# Patient Record
Sex: Female | Born: 1935
Health system: Southern US, Community
[De-identification: ages and names within clinical notes are randomized; demographics above are authoritative.]

## PROBLEM LIST (undated history)

## (undated) DIAGNOSIS — Z780 Asymptomatic menopausal state: Secondary | ICD-10-CM

## (undated) DIAGNOSIS — K219 Gastro-esophageal reflux disease without esophagitis: Secondary | ICD-10-CM

## (undated) DIAGNOSIS — Z8679 Personal history of other diseases of the circulatory system: Secondary | ICD-10-CM

## (undated) DIAGNOSIS — E669 Obesity, unspecified: Secondary | ICD-10-CM

## (undated) DIAGNOSIS — K5790 Diverticulosis of intestine, part unspecified, without perforation or abscess without bleeding: Secondary | ICD-10-CM

## (undated) DIAGNOSIS — M1990 Unspecified osteoarthritis, unspecified site: Secondary | ICD-10-CM

## (undated) DIAGNOSIS — E785 Hyperlipidemia, unspecified: Secondary | ICD-10-CM

## (undated) DIAGNOSIS — I739 Peripheral vascular disease, unspecified: Secondary | ICD-10-CM

## (undated) DIAGNOSIS — T7840XA Allergy, unspecified, initial encounter: Secondary | ICD-10-CM

## (undated) DIAGNOSIS — I10 Essential (primary) hypertension: Secondary | ICD-10-CM

## (undated) HISTORY — DX: Obesity, unspecified: E66.9

## (undated) HISTORY — DX: Gastro-esophageal reflux disease without esophagitis: K21.9

## (undated) HISTORY — DX: Peripheral vascular disease, unspecified: I73.9

## (undated) HISTORY — PX: TEMPORAL ARTERY BIOPSY / LIGATION: SUR132

## (undated) HISTORY — DX: Hyperlipidemia, unspecified: E78.5

## (undated) HISTORY — PX: SHOULDER SURGERY: SHX246

## (undated) HISTORY — DX: Unspecified osteoarthritis, unspecified site: M19.90

## (undated) HISTORY — DX: Personal history of other diseases of the circulatory system: Z86.79

## (undated) HISTORY — DX: Diverticulosis of intestine, part unspecified, without perforation or abscess without bleeding: K57.90

## (undated) HISTORY — DX: Essential (primary) hypertension: I10

## (undated) HISTORY — DX: Asymptomatic menopausal state: Z78.0

## (undated) HISTORY — DX: Allergy, unspecified, initial encounter: T78.40XA

## (undated) HISTORY — PX: HERNIA REPAIR: SHX51

## (undated) HISTORY — PX: EYE SURGERY: SHX253

---

## 1997-08-11 ENCOUNTER — Ambulatory Visit (HOSPITAL_COMMUNITY): Admission: RE | Admit: 1997-08-11 | Discharge: 1997-08-11 | Payer: Self-pay | Admitting: Family Medicine

## 1997-08-18 ENCOUNTER — Ambulatory Visit (HOSPITAL_COMMUNITY): Admission: RE | Admit: 1997-08-18 | Discharge: 1997-08-18 | Payer: Self-pay | Admitting: Family Medicine

## 1998-11-30 ENCOUNTER — Other Ambulatory Visit: Admission: RE | Admit: 1998-11-30 | Discharge: 1998-11-30 | Payer: Self-pay | Admitting: *Deleted

## 1998-11-30 ENCOUNTER — Encounter (INDEPENDENT_AMBULATORY_CARE_PROVIDER_SITE_OTHER): Payer: Self-pay | Admitting: Specialist

## 1998-12-23 ENCOUNTER — Other Ambulatory Visit: Admission: RE | Admit: 1998-12-23 | Discharge: 1998-12-23 | Payer: Self-pay | Admitting: *Deleted

## 1998-12-23 ENCOUNTER — Encounter (INDEPENDENT_AMBULATORY_CARE_PROVIDER_SITE_OTHER): Payer: Self-pay

## 1999-08-15 ENCOUNTER — Encounter: Admission: RE | Admit: 1999-08-15 | Discharge: 1999-08-15 | Payer: Self-pay | Admitting: Internal Medicine

## 1999-08-15 ENCOUNTER — Encounter: Payer: Self-pay | Admitting: Internal Medicine

## 1999-09-13 ENCOUNTER — Encounter (HOSPITAL_BASED_OUTPATIENT_CLINIC_OR_DEPARTMENT_OTHER): Payer: Self-pay | Admitting: General Surgery

## 1999-09-15 ENCOUNTER — Ambulatory Visit (HOSPITAL_COMMUNITY): Admission: RE | Admit: 1999-09-15 | Discharge: 1999-09-15 | Payer: Self-pay | Admitting: General Surgery

## 1999-09-15 ENCOUNTER — Encounter (INDEPENDENT_AMBULATORY_CARE_PROVIDER_SITE_OTHER): Payer: Self-pay | Admitting: *Deleted

## 2000-10-29 ENCOUNTER — Encounter: Admission: RE | Admit: 2000-10-29 | Discharge: 2000-10-29 | Payer: Self-pay | Admitting: Internal Medicine

## 2000-10-29 ENCOUNTER — Encounter: Payer: Self-pay | Admitting: Internal Medicine

## 2000-12-19 ENCOUNTER — Other Ambulatory Visit: Admission: RE | Admit: 2000-12-19 | Discharge: 2000-12-19 | Payer: Self-pay | Admitting: Internal Medicine

## 2001-02-12 ENCOUNTER — Emergency Department (HOSPITAL_COMMUNITY): Admission: EM | Admit: 2001-02-12 | Discharge: 2001-02-12 | Payer: Self-pay | Admitting: Emergency Medicine

## 2001-02-15 ENCOUNTER — Encounter (HOSPITAL_BASED_OUTPATIENT_CLINIC_OR_DEPARTMENT_OTHER): Payer: Self-pay | Admitting: General Surgery

## 2001-02-18 ENCOUNTER — Encounter (INDEPENDENT_AMBULATORY_CARE_PROVIDER_SITE_OTHER): Payer: Self-pay | Admitting: *Deleted

## 2001-02-18 ENCOUNTER — Ambulatory Visit (HOSPITAL_COMMUNITY): Admission: RE | Admit: 2001-02-18 | Discharge: 2001-02-18 | Payer: Self-pay | Admitting: General Surgery

## 2001-11-25 ENCOUNTER — Encounter: Admission: RE | Admit: 2001-11-25 | Discharge: 2001-11-25 | Payer: Self-pay | Admitting: Internal Medicine

## 2001-11-25 ENCOUNTER — Encounter: Payer: Self-pay | Admitting: Internal Medicine

## 2001-12-25 ENCOUNTER — Other Ambulatory Visit: Admission: RE | Admit: 2001-12-25 | Discharge: 2001-12-25 | Payer: Self-pay | Admitting: Internal Medicine

## 2002-03-06 HISTORY — PX: OTHER SURGICAL HISTORY: SHX169

## 2002-04-21 ENCOUNTER — Encounter: Payer: Self-pay | Admitting: Emergency Medicine

## 2002-04-21 ENCOUNTER — Emergency Department (HOSPITAL_COMMUNITY): Admission: EM | Admit: 2002-04-21 | Discharge: 2002-04-21 | Payer: Self-pay | Admitting: Emergency Medicine

## 2002-06-03 ENCOUNTER — Ambulatory Visit (HOSPITAL_BASED_OUTPATIENT_CLINIC_OR_DEPARTMENT_OTHER): Admission: RE | Admit: 2002-06-03 | Discharge: 2002-06-03 | Payer: Self-pay | Admitting: *Deleted

## 2002-06-03 ENCOUNTER — Encounter (INDEPENDENT_AMBULATORY_CARE_PROVIDER_SITE_OTHER): Payer: Self-pay | Admitting: *Deleted

## 2002-09-03 ENCOUNTER — Encounter: Payer: Self-pay | Admitting: Diagnostic Radiology

## 2002-09-03 ENCOUNTER — Encounter: Payer: Self-pay | Admitting: Orthopedic Surgery

## 2002-09-03 ENCOUNTER — Encounter: Admission: RE | Admit: 2002-09-03 | Discharge: 2002-09-03 | Payer: Self-pay | Admitting: Orthopedic Surgery

## 2002-11-03 ENCOUNTER — Encounter: Payer: Self-pay | Admitting: Vascular Surgery

## 2002-11-04 ENCOUNTER — Inpatient Hospital Stay (HOSPITAL_COMMUNITY): Admission: RE | Admit: 2002-11-04 | Discharge: 2002-11-08 | Payer: Self-pay | Admitting: Vascular Surgery

## 2002-11-04 ENCOUNTER — Encounter: Payer: Self-pay | Admitting: Vascular Surgery

## 2002-11-13 ENCOUNTER — Encounter: Payer: Self-pay | Admitting: Vascular Surgery

## 2002-11-13 ENCOUNTER — Inpatient Hospital Stay (HOSPITAL_COMMUNITY): Admission: RE | Admit: 2002-11-13 | Discharge: 2002-11-24 | Payer: Self-pay | Admitting: Sports Medicine

## 2002-11-18 ENCOUNTER — Encounter: Payer: Self-pay | Admitting: Vascular Surgery

## 2002-11-24 ENCOUNTER — Inpatient Hospital Stay: Admission: RE | Admit: 2002-11-24 | Discharge: 2002-12-06 | Payer: Self-pay | Admitting: Vascular Surgery

## 2003-11-30 ENCOUNTER — Encounter: Admission: RE | Admit: 2003-11-30 | Discharge: 2003-11-30 | Payer: Self-pay | Admitting: Family Medicine

## 2004-01-05 ENCOUNTER — Ambulatory Visit: Payer: Self-pay | Admitting: Internal Medicine

## 2004-02-04 HISTORY — PX: COLONOSCOPY: SHX174

## 2004-02-15 ENCOUNTER — Ambulatory Visit: Payer: Self-pay | Admitting: Internal Medicine

## 2004-12-06 ENCOUNTER — Other Ambulatory Visit: Admission: RE | Admit: 2004-12-06 | Discharge: 2004-12-06 | Payer: Self-pay | Admitting: Family Medicine

## 2004-12-10 ENCOUNTER — Emergency Department (HOSPITAL_COMMUNITY): Admission: EM | Admit: 2004-12-10 | Discharge: 2004-12-10 | Payer: Self-pay | Admitting: Emergency Medicine

## 2005-01-04 ENCOUNTER — Encounter: Admission: RE | Admit: 2005-01-04 | Discharge: 2005-01-04 | Payer: Self-pay | Admitting: Family Medicine

## 2005-08-02 ENCOUNTER — Encounter (INDEPENDENT_AMBULATORY_CARE_PROVIDER_SITE_OTHER): Payer: Self-pay | Admitting: *Deleted

## 2005-08-02 ENCOUNTER — Ambulatory Visit (HOSPITAL_COMMUNITY): Admission: RE | Admit: 2005-08-02 | Discharge: 2005-08-02 | Payer: Self-pay | Admitting: Obstetrics and Gynecology

## 2005-08-08 ENCOUNTER — Ambulatory Visit: Payer: Self-pay | Admitting: Family Medicine

## 2005-09-26 ENCOUNTER — Ambulatory Visit: Payer: Self-pay | Admitting: Family Medicine

## 2005-11-03 ENCOUNTER — Ambulatory Visit: Payer: Self-pay | Admitting: Family Medicine

## 2005-11-23 ENCOUNTER — Ambulatory Visit: Payer: Self-pay | Admitting: Family Medicine

## 2005-12-28 ENCOUNTER — Ambulatory Visit: Payer: Self-pay | Admitting: Family Medicine

## 2006-01-16 ENCOUNTER — Encounter: Admission: RE | Admit: 2006-01-16 | Discharge: 2006-01-16 | Payer: Self-pay | Admitting: Family Medicine

## 2006-03-09 ENCOUNTER — Ambulatory Visit: Payer: Self-pay | Admitting: Family Medicine

## 2006-04-26 ENCOUNTER — Ambulatory Visit: Payer: Self-pay | Admitting: Family Medicine

## 2006-05-07 ENCOUNTER — Ambulatory Visit: Payer: Self-pay | Admitting: Family Medicine

## 2006-05-24 ENCOUNTER — Ambulatory Visit: Payer: Self-pay | Admitting: Family Medicine

## 2006-06-28 ENCOUNTER — Ambulatory Visit: Payer: Self-pay | Admitting: Family Medicine

## 2006-09-03 ENCOUNTER — Ambulatory Visit: Payer: Self-pay | Admitting: Family Medicine

## 2006-10-24 ENCOUNTER — Ambulatory Visit: Payer: Self-pay | Admitting: Family Medicine

## 2006-12-26 ENCOUNTER — Ambulatory Visit: Payer: Self-pay | Admitting: Family Medicine

## 2007-01-22 ENCOUNTER — Encounter: Admission: RE | Admit: 2007-01-22 | Discharge: 2007-01-22 | Payer: Self-pay | Admitting: Family Medicine

## 2007-04-03 ENCOUNTER — Ambulatory Visit: Payer: Self-pay | Admitting: Family Medicine

## 2007-05-01 ENCOUNTER — Ambulatory Visit: Payer: Self-pay | Admitting: Family Medicine

## 2007-05-30 ENCOUNTER — Ambulatory Visit: Payer: Self-pay | Admitting: Family Medicine

## 2007-07-02 ENCOUNTER — Ambulatory Visit: Payer: Self-pay | Admitting: Family Medicine

## 2007-07-31 ENCOUNTER — Ambulatory Visit: Payer: Self-pay | Admitting: Family Medicine

## 2007-09-02 ENCOUNTER — Ambulatory Visit: Payer: Self-pay | Admitting: Family Medicine

## 2007-09-30 ENCOUNTER — Ambulatory Visit: Payer: Self-pay | Admitting: Family Medicine

## 2007-10-30 ENCOUNTER — Ambulatory Visit: Payer: Self-pay | Admitting: Family Medicine

## 2007-12-02 ENCOUNTER — Ambulatory Visit: Payer: Self-pay | Admitting: Family Medicine

## 2007-12-31 ENCOUNTER — Ambulatory Visit: Payer: Self-pay | Admitting: Family Medicine

## 2008-01-31 ENCOUNTER — Ambulatory Visit: Payer: Self-pay | Admitting: Family Medicine

## 2008-03-03 ENCOUNTER — Ambulatory Visit: Payer: Self-pay | Admitting: Family Medicine

## 2008-04-03 ENCOUNTER — Ambulatory Visit: Payer: Self-pay | Admitting: Family Medicine

## 2008-05-05 ENCOUNTER — Ambulatory Visit: Payer: Self-pay | Admitting: Family Medicine

## 2008-05-07 ENCOUNTER — Ambulatory Visit: Payer: Self-pay | Admitting: Surgery

## 2008-05-26 ENCOUNTER — Encounter: Admission: RE | Admit: 2008-05-26 | Discharge: 2008-05-26 | Payer: Self-pay | Admitting: Family Medicine

## 2008-12-01 ENCOUNTER — Ambulatory Visit: Payer: Self-pay | Admitting: Surgery

## 2008-12-14 ENCOUNTER — Ambulatory Visit: Payer: Self-pay | Admitting: Family Medicine

## 2009-05-10 ENCOUNTER — Ambulatory Visit: Payer: Self-pay | Admitting: Surgery

## 2009-08-09 ENCOUNTER — Encounter: Admission: RE | Admit: 2009-08-09 | Discharge: 2009-08-09 | Payer: Self-pay | Admitting: Family Medicine

## 2009-08-20 ENCOUNTER — Ambulatory Visit: Payer: Self-pay | Admitting: Family Medicine

## 2009-09-17 ENCOUNTER — Ambulatory Visit: Payer: Self-pay | Admitting: Family Medicine

## 2009-12-06 ENCOUNTER — Ambulatory Visit: Payer: Self-pay | Admitting: Surgery

## 2009-12-16 ENCOUNTER — Ambulatory Visit: Payer: Self-pay | Admitting: Vascular Surgery

## 2010-06-23 ENCOUNTER — Encounter (INDEPENDENT_AMBULATORY_CARE_PROVIDER_SITE_OTHER): Payer: Medicare Other | Admitting: Family Medicine

## 2010-06-23 DIAGNOSIS — J309 Allergic rhinitis, unspecified: Secondary | ICD-10-CM

## 2010-06-23 DIAGNOSIS — I10 Essential (primary) hypertension: Secondary | ICD-10-CM

## 2010-06-23 DIAGNOSIS — R5381 Other malaise: Secondary | ICD-10-CM

## 2010-06-23 DIAGNOSIS — Z0000 Encounter for general adult medical examination without abnormal findings: Secondary | ICD-10-CM

## 2010-06-23 DIAGNOSIS — R5383 Other fatigue: Secondary | ICD-10-CM

## 2010-07-19 NOTE — Procedures (Signed)
DUPLEX DEEP VENOUS EXAM - LOWER EXTREMITY   INDICATION:  Swelling and cellulitis.   HISTORY:  Edema:  No.  Trauma/Surgery:  Right fem-pop bypass graft, right greater saphenous  harvested.  Pain:  No.  PE:  No.  Previous DVT:  No.  Anticoagulants:  No.  Other:   DUPLEX EXAM:                CFV   SFV   PopV  PTV    GSV                R  L  R  L  R  L  R   L  R  L  Thrombosis    o  o  o     o     o  Spontaneous   +  +  +     +     +  Phasic        +  +  +     +     +  Augmentation  +  +  +     +     +  Compressible  +  +  +     +     +  Competent     +  +  +     +     +   Legend:  + - yes  o - no  p - partial  D - decreased   IMPRESSION:  1. No evidence of acute deep venous thrombosis within the right lower      extremity.  2. Right greater saphenous not visualized due to harvesting.    _____________________________  Janetta Hora. Fields, MD   OD/MEDQ  D:  12/16/2009  T:  12/16/2009  Job:  [id number]

## 2010-07-19 NOTE — Procedures (Signed)
BYPASS GRAFT EVALUATION   INDICATION:  Follow up right lower extremity bypass graft.   HISTORY:  Diabetes:  No.  Cardiac:  Murmur.  Hypertension:  Yes.  Smoking:  No.  Previous Surgery:  Right femoral-anterior tibial artery bypass graft,  11/13/02 by Dr. Darrick Penna.   SINGLE LEVEL ARTERIAL EXAM                               RIGHT              LEFT  Brachial:                    148                152  Anterior tibial:             160                92  Posterior tibial:            143                85  Peroneal:  Ankle/brachial index:        1.05               0.61   PREVIOUS ABI:  Date: 12/01/08  RIGHT:  1.07  LEFT:  0.53   LOWER EXTREMITY BYPASS GRAFT DUPLEX EXAM:   DUPLEX:  Patent right femoral-anterior tibial artery bypass graft with  biphasic Doppler waveforms proximal to, within, and distal to the bypass  graft.   IMPRESSION:  1. Patent right femoral-anterior tibial artery bypass graft.  2. Bilateral ankle brachial indices appear stable from previous study.  3. Right ankle brachial index appears within normal limits.  Left      ankle brachial index is suggestive of moderate arterial compromise.  4. No significant changes from previous study.   ___________________________________________  Janetta Hora Fields, MD   AS/MEDQ  D:  05/10/2009  T:  05/10/2009  Job:  [id number]

## 2010-07-19 NOTE — Procedures (Signed)
BYPASS GRAFT EVALUATION   INDICATION:  Follow up right femoral-to-anterior tibial bypass graft.   HISTORY:  Diabetes:  No.  Cardiac:  Murmur.  Hypertension:  Yes.  Smoking:  No.  Previous Surgery:  Right femoral-to-anterior tibial bypass graft on  11/13/02.   SINGLE LEVEL ARTERIAL EXAM                               RIGHT              LEFT  Brachial:                    138                129  Anterior tibial:             128                59  Posterior tibial:            126                69  Peroneal:  Ankle/brachial index:        0.93               0.50   PREVIOUS ABI:  Date: 01/08/06  RIGHT:  >1.0  LEFT:  0.36   LOWER EXTREMITY BYPASS GRAFT DUPLEX EXAM:   DUPLEX:  Patent right femoral to anterior tibial bypass graft with no  evidence of focal stenosis.   IMPRESSION:  1. Patent right femoral-to-anterior tibial bypass graft with no      evidence of focal stenosis.  2. Mildly abnormal ankle brachial indices with biphasic Doppler      waveforms noted in the right leg.  3. Moderately abnormal ankle brachial indices with monophasic Doppler      waveforms noted in the left leg.  4. Status post right femoral-to-anterior tibial bypass graft on      11/13/02.   ___________________________________________  Janetta Hora. Fields, MD   MG/MEDQ  D:  05/07/2008  T:  05/07/2008  Job:  [id number]   cc:   [name], M.D.

## 2010-07-19 NOTE — Assessment & Plan Note (Signed)
OFFICE VISIT   [name] [name], [name] [name]  DOB:  Dec 23, 1931                                       12/06/2009  ZOXWR#:[id number]   The patient comes back in today.  She has undergone right femoral to  anterior tibial bypass graft by Dr. Darrick Penna in 2004.  She has been on  Coumadin in the past however this has been discontinued.  She comes in  for some redness and swelling in her right leg.  She attributes this to  her aquatic exercises which she has now stopped doing.  She has been  rubbing some ointment onto it and her leg has been feeling better.  There was some discomfort.   PHYSICAL EXAMINATION:  She is afebrile, well-appearing, in no distress.  Respirations are nonlabored.  Extremities:  Her incisions are well-  healed.  There is blanching erythema on the medial side of her right leg  extending on to the anterior part, this is all below her operative  incision.  The area is more warm when compared to the other leg.  There  is no drainage.  There are no wound openings.   The patient had a duplex ultrasound today which shows an ABI of 1.05,  with no significant changes from a prior study.   ASSESSMENT AND PLAN:  Right leg cellulitis.  I did not get a formal  venous ultrasound today.  However, there were no obvious venous  abnormalities in the deep system when evaluating her bypass graft.  I  think this is a superficial cellulitis.  I am going to put her on Keflex  and have her come back and see Dr. Darrick Penna next week.  Before seeing Dr.  Darrick Penna she will have a formal venous ultrasound to rule out DVT.     Jorge Ny, MD  Electronically Signed   VWB/MEDQ  D:  12/06/2009  T:  12/07/2009  Job:  [id number]

## 2010-07-19 NOTE — Procedures (Signed)
BYPASS GRAFT EVALUATION   INDICATION:  Follow up right lower extremity bypass graft.   HISTORY:  Diabetes:  no  Cardiac:  murmur  Hypertension:  yes  Smoking:  no  Previous Surgery:  Right femoral-anterior tibial artery bypass graft   SINGLE LEVEL ARTERIAL EXAM                               RIGHT              LEFT  Brachial:                    139                136  Anterior tibial:             149                74  Posterior tibial:            136                60  Peroneal:  Ankle/brachial index:        1.07               0.53   PREVIOUS ABI:  Date: 05/07/2008  RIGHT:  0.93  LEFT:  0.50   LOWER EXTREMITY BYPASS GRAFT DUPLEX EXAM:   DUPLEX:  Patent right femoral-anterior tibial artery bypass graft with  biphasic Doppler arterial wave forms proximal to, within and distal to  it.   IMPRESSION:  1. Patent right femoral-anterior tibial artery bypass graft.  2. Right ankle brachial index shows increase from previous study,      correlating more with studies before that one.  3. Left ankle brachial index appears stable from previous study.   ___________________________________________  Janetta Hora Fields, MD   AS/MEDQ  D:  12/01/2008  T:  12/02/2008  Job:  [id number]   cc:   [name], M.D.

## 2010-07-19 NOTE — Procedures (Signed)
BYPASS GRAFT EVALUATION   INDICATION:  Followup right lower extremity arterial bypass graft.   HISTORY:  Diabetes:  No.  Cardiac:  Murmur.  Hypertension:  Yes.  Smoking:  No.  Previous Surgery:  Right femoral anterior tibial bypass graft 11/13/2002  by Dr. Darrick Penna.   SINGLE LEVEL ARTERIAL EXAM                               RIGHT              LEFT  Brachial:                    125                128  Anterior tibial:             129                76  Posterior tibial:            112                69  Peroneal:  Ankle/brachial index:        1.01               0.59   PREVIOUS ABI:  Date:  05/10/2009  RIGHT:  1.05  LEFT:  0.61   LOWER EXTREMITY BYPASS GRAFT DUPLEX EXAM:   DUPLEX:  Patent right femoral anterior tibial artery bypass graft with  biphasic Doppler waveforms proximal to, within and distal to the bypass  graft.   IMPRESSION:  1. Patent right femoral anterior tibial artery bypass graft.  2. Bilateral ankle brachial indices appear stable from previous study.  3. Right ankle brachial index appeared within normal limits.  Left      ankle brachial index suggestive of moderate arterial compromise.  4. No significant changes from previous study.      ___________________________________________  V. Charlena Cross, MD   OD/MEDQ  D:  12/06/2009  T:  12/06/2009  Job:  [id number]

## 2010-07-19 NOTE — Assessment & Plan Note (Signed)
OFFICE VISIT   [name] [name], [name] [name]  DOB:  20-Jul-1931                                       05/20/2008  ZOXWR#:[id number]   The patient was last seen in March of 2005.  She previously underwent  right femoral to anterior tibial artery bypass with vein in 2004.  She  has been having intermittent duplex exams of this bypass graft since  then.  Her last exam was 05/07/2008 and had an ABI of 0.93 on the right  side at that time with no significant increased velocities in the bypass  graft.  Dr. Susann Givens paged me today to discuss whether or not she needs  to continue her Coumadin therapy long-term.  Since she has been stable  overall for several years and the bypass graft has no evidence of  narrowing I believe it is probably okay for her to come off her Coumadin  at this point.  Dr. Susann Givens is going to inform the patient of this.  The  patient will continue to be followed in our graft surveillance program.   Janetta Hora. Fields, MD  Electronically Signed   CEF/MEDQ  D:  05/20/2008  T:  05/21/2008  Job:  [id number]

## 2010-07-19 NOTE — Assessment & Plan Note (Signed)
OFFICE VISIT   [name] [name], [name] [name]  DOB:  May 11, 1931                                       12/16/2009  EAVWU#:[id number]   The patient was seen by my partner, Dr. Myra Gianotti, last week for some  erythema and swelling on the medial aspect of her right leg.  Her bypass  graft was opened but she was thought to have a cellulitis.  She was  started on Keflex at that time.  She returns today for further followup.  She states that the cellulitis has now completely resolved.  Her leg  feels much better.  However, now she has complaints of burning and  irritation in her vaginal area which she thinks is most likely secondary  to a yeast infection from her antibiotics.   She had a venous duplex ultrasound today which showed no evidence of  DVT.   PHYSICAL EXAM:  Blood pressure is 160/81 in the left arm, heart rate 71  and regular.  Temperature is 97.9.  right leg has a 2+ lateral graft  pulse.  The erythema on the medial aspect of her leg is completely  resolved.  She has no obvious skin rash.  No open ulcers.   The patient seems to be recovering well from her recent cellulitis.  Her  bypass graft is patent.   PLAN:  1. She was given a prescription for Diflucan today 400 mg one dose to      combat her yeast infection.  2. She will follow up in one year's time for repeat graft duplex scan.      She will see me in two years' time.     Janetta Hora. Fields, MD  Electronically Signed   CEF/MEDQ  D:  12/16/2009  T:  12/17/2009  Job:  [id number]   cc:   [name], M.D.

## 2010-07-21 ENCOUNTER — Telehealth: Payer: Self-pay | Admitting: Family Medicine

## 2010-07-21 NOTE — Telephone Encounter (Signed)
PT WANTED TO KNOW IF SCRIPTS WERE REFILLED UNDER [employer]ARP MEDICARE INFO THAT SHE BROUGHT IN WITH April VISIT. SHE REQUESTED THAT WE GET HER 90DY SUPPLY OF MEDS LISTED ON AARP SCRIPT DOCUMENT IN CHART. SHE NEED MEDS NOW. SHE MAY NEED TO GET SOME THRU HER REGULAR PHARMACY ([facility]) OR SAMPLES FROM [place] UNTIL SHE CAN GET 90 DY SUPPLY SENT TO HER. PLEASE CALL ASAP.  ALSO WANT TO GET LAB RESULTS FROM HER LAST VISIT IN APRIL

## 2010-07-22 ENCOUNTER — Other Ambulatory Visit: Payer: Self-pay | Admitting: Family Medicine

## 2010-07-22 NOTE — Op Note (Signed)
NAME:  [name] [name], [name] [name]                           ACCOUNT NO.:  [id number]   MEDICAL RECORD NO.:  [id number]                   PATIENT TYPE:  INP   LOCATION:  NA                                   FACILITY:  [facility]   PHYSICIAN:  Janetta Hora. Fields, MD               DATE OF BIRTH:  1931/06/25   DATE OF PROCEDURE:  11/13/2002  DATE OF DISCHARGE:                                 OPERATIVE REPORT   PROCEDURE:  1. Right femoral to anterior tibial bypass with non-reversed greater     saphenous vein.  2. Interoperative arteriogram x 2.   PREOPERATIVE DIAGNOSIS:  Chronic ischemia of right foot with tissue loss.   POSTOPERATIVE DIAGNOSIS:  Chronic ischemia of right foot with tissue loss.   ANESTHESIA:  General .   SURGEON:  Charles E. Darrick Penna, M.D.   ASSISTANTBalinda Quails, M.D.  Pecola Leisure, PA  Claudette Royston Sinner, N.P.  Coral Ceo, P.A.   INDICATIONS FOR PROCEDURE:  The patient is a 77 year old female with  ulcerations on her right foot and a history of vasculitis.  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   FINDINGS:  1. 2.5 to 3 mm saphenous vein.  2. 1.5 mm anterior tibial artery.  3. Heavily diseased nearly occluded peroneal artery.   PROCEDURE IN DETAIL:  After obtaining informed consent (risks including  bleeding, infection, myocardial infarction, graft thrombosis, and limb  loss), the patient was taken to the operating room.  The patient was placed  in the supine position on the operating table.  After induction of general  anesthesia and endotracheal intubation, a Foley catheter was placed.  Next,  the left and right lower extremities were prepped and draped in the usual  sterile fashion.  A longitudinal incision was made over the right groin and  this incision was carried down through the subcutaneous tissues down to the  level of the right common femoral artery.  The artery was dissected free  circumferentially and  controlled with a vessel loop.  The profunda femoris  and superficial femoral arteries were also dissected free circumferentially  and controlled with vessel loops.  There was one small side collateral  branch which was also controlled with a vessel loop.  Next, the greater  saphenous vein was dissected free from its origin and the greater saphenous  vein was harvested with two skip incisions in the medial leg.  There was  also a large accessory saphenous branch which was dissected free, as well.  The side branches were ligated on the accessory branch and the main  saphenous trunk with clips and 4-0 silk ties.  The vein was dissected all  the way down to the mid tibia.  Next, the cautery was used to open the deep  compartment of the leg.  The peroneal artery was dissected free  circumferentially and found  to be very fibrotic.  There was essentially no  Doppler signal within it.  The peroneal artery was opened longitudinally and  there was some back bleeding but essentially no forward bleeding.  The  artery would not accept a 1 mm dilator further than approximately 2 cm  distal to this arteriotomy.  An interoperative arteriogram was performed  which showed a severely diseased distal peroneal artery with back filling by  collaterals of the posterior tibial artery but no direct flow.  Therefore,  the peroneal artery was ligated.  The posterior tibial artery was also  explored and found to be quite fibrotic exteriorly and was thought to not be  suitable for bypass.  At this point, it was decided that the anterior tibial  artery would be the best target vessel, so a lateral incision was made over  the anterior compartment and the anterior tibial artery was dissected free  circumferentially for approximately 3 cm.  This was then controlled with  vessel loops.  Next, the greater saphenous vein was removed from the  saphenofemoral junction down to the mid tibial level.  At the saphenofemoral   junction, the vein was cut and oversewn with a 5-0 Prolene suture.  The vein  was then inspected and all small side branches were ligated with 7-0 Prolene  sutures.  The vein was dilated and was found to be 2.5 to 3 mm in diameter  throughout its course.  The lower end was primarily 2 mm in diameter.  Next,  the patient was given 7000 units of intravenous heparin.  The common femoral  artery was clamped and the vessel loops were pulled up on the profunda,  superficial femoral, and side branch arteries.  A longitudinal arteriotomy  was made in the common femoral artery just above the level of the  bifurcation of the SFA and profunda.  The vein graft was spatulated and sewn  end-to-side to the artery.  The anastomosis was flushed thoroughly prior to  completion.  Next, the superficial femoral artery was opened and the common  femoral artery was opened and the artery was flushed down the SFA.  This had  been chronically occluded.  Next, the profunda, femoral side branch, and  vein graft were opened and the anastomosis was made hemostatic.  A Mills  valvulotome was then brought up on the operative field and the valves were  lysed throughout the course of the entire vein.  While the vein was dilated  with blood, it was marked for orientation.  Next, a tunneler was used to  tunnel subcutaneously in a lateral fashion down the leg and the vein graft  was passed through the tunneler down to the level of the anterior tibial  artery.  Next, a longitudinal arteriotomy was made in the anterior tibial  artery and this was found to easily accept a 1.5 mm dilator.  This would not  accept a 2 mm dilator.  An end-to-side anastomosis was then made between the  vein after spatulating it and cutting it to length.  The anastomosis was end  of vein to side of artery with a running 7-0 Prolene suture.  Prior to completing the anastomosis, the graft and the proximal and distal anterior  tibial artery were thoroughly  flushed, back bled, and flushed again with  heparinized saline.  After the anastomosis was completed, the proximal  anterior tibial artery clamp was removed and the vein graft clamp was  removed and allowed to flush up the proximal  anterior tibial artery.  Next,  the distal anterior tibial artery was released.  At this point, the  anastomosis was made hemostatic.  It was inspected with the Doppler and  found to have good flow through the graft and flow was essentially 100%  through the graft, with clamping of the graft, flow in the distal anterior  tibial artery disappeared completely.  The patient also was noted to have a  Doppler signal over the anterior tibial artery at the level of the foot.  This also diminished almost completely when the graft was occluded.  At this  point, an arteriogram was performed through a 21 gauge butterfly needle  inserted at the proximal anastomosis.  The arteriogram showed that there was  some spasm in the anterior tibial artery distally but, otherwise, the  anastomosis was widely patent to an extremely small anterior tibial artery  but there was run off all the way down into the foot.  Next, hemostasis was  obtained in all incisions and these were closed in the deep layers with  running Vicryl sutures.  The groin was closed in multiple layers and the  skin was closed with staples.  The saphenectomy site was also closed in  multiple layers as well as the deep incision on the medial aspect of the leg  where the peroneal artery had been explored.  The skin was closed with  staples.  The lateral incision for the anterior tibial bypass was closed  with Vicryl suture in the deep layer and then the skin was closed with  staples.  The patient tolerated the procedure well and there were no  complications.  The patient was extubated in the operating room and taken to  the recovery room in stable condition.  The needle, sponge, and instrument  counts were correct x 2  at the end of the case.                                               Janetta Hora. Fields, MD    CEF/MEDQ  D:  11/13/2002  T:  11/14/2002  Job:  [id number]

## 2010-07-22 NOTE — H&P (Signed)
NAME:  [name] [name], [name] [name]                           ACCOUNT NO.:  [id number]   MEDICAL RECORD NO.:  [id number]                   PATIENT TYPE:  OIB   LOCATION:  5729                                 FACILITY:  [facility]   PHYSICIAN:  Coral Ceo, P.A.                  DATE OF BIRTH:  Aug 21, 1931   DATE OF ADMISSION:  11/04/2002  DATE OF DISCHARGE:                                HISTORY & PHYSICAL   CHIEF COMPLAINT:  Right foot pain.   HISTORY OF PRESENT ILLNESS:  The patient is a 77 year old black female with  a six weeks history of worsening right foot pain.  Initially, she developed  burning and intense pain in her right foot which was presently  intermittently.  However, over the past several weeks, it has become  persistent and debilitating.  She has developed ulcerations in the lateral  aspect of her right foot as well as in the interdigital spaces.  Initially  when she began to have problems, she was seen by Dr. Nadara Mustard, and  later at the wound-care center at [facility].  She has been treated with  topical medications as well as p.o. antibiotics and dressing changes without  improvement.  She denies specific claudication symptoms, rest pain, hip or  buttocks pain.  She reports that now she has developed some intermittent  pain in her left foot as well, but no ulcerations.  She was seen by Dr. Liliane Bade in the [facility] office and ankle-brachial indices showed 0.18 on the right  and 0.44 on the left.  Because of her severe vascular compromise on the  right, it was recommended that she proceed with an arteriogram.  This was  performed today at [facility] by Dr. Darrick Penna.  She was found to have  right superficial femoral artery occlusion with reconstitution of the  peroneal artery with marginal runoff on the right.  She was also noted to  have some popliteal and tibial disease on the left.  It is recommended that  she proceed with a right femoral to peroneal bypass graft later  this week  for limb salvage and relief of symptoms.   PAST MEDICAL HISTORY:  1. Temporal arteritis by positive biopsy.  2. Hypertension.  3. History of irregular heart beat.  4. She denies a history of coronary artery disease, diabetes mellitus,     hyperlipidemia, chronic obstructive pulmonary disease, strokes or cancer.   PAST SURGICAL HISTORY:  1. Right shoulder pinning surgery in 1964.  2. Bilateral cysts removed from her feet in the 1970's.  3. Umbilical hernia repair in 2001.  4. Ventral hernia repair in 2003.   CURRENT MEDICATIONS:  1. Prednisone 15 mg daily.  2. Triamterene/ hydrochlorothiazide 37.5/25 one daily.  3. Cefalexin 500 mg t.i.d.  4. Hydrocodone APAP p.r.n. for pain.  5. Calcium daily.  6. Vitamin E daily.   ALLERGIES:  No known drug allergies.   FAMILY HISTORY:  Her mother died at age 32 of coronary artery disease for  which she really did not seek medical attention.  Her father died at age 48.  She has nine siblings, all of whom are alive and well.  One of her brothers  is an alcoholic and actually was just discharged from [facility] to a  nursing center.  There is a strong history of strokes in both sets of her  grandparents as well as coronary artery disease in her grandparents and  uncle.   SOCIAL HISTORY:  She is married and resides at [place] with her husband.  She is retired.  She denies any past or current tobacco use.  She previously  drank socially but has not had any alcohol in 10 to 15 years.   REVIEW OF SYSTEMS:  She reports a recent weight gain which she attributes to  inactivity related to her foot pain.  She states that she previously  exercised four days a week, specifically water aerobics.  She reports  occasional headaches and gastroesophageal reflux symptoms.  She has been  told in the past that she has an irregular heartbeat, but has never been  treated for specific arrhythmia.  She does have right lower-extremity edema.  Upon  further questioning, she states that she has had some chest discomfort  which she thought was heartburn following heavy meals.  She also has some  history of dyspnea on exertion.  She denies fevers, chills, recent  infections, weakness, fatigue, amaurosis fugax, visual changes, dysphagia,  transient ischemic attack symptoms, palpitations, abdominal pain, cough,  nausea, vomiting, diarrhea, constipation, dysuria, nocturia, hematuria,  hematemesis, hematochezia, melena, anxiety, depression, intolerance to heat  or cold.   PHYSICAL EXAMINATION:  VITAL SIGNS:  Blood pressure 137/83, pulse 98 and  regular, respirations 20 and unlabored.  Temperature 98.4.  GENERAL:  This is an obese, black female, in no acute distress.  HEENT:  Normocephalic, atraumatic.  Pupils equal, round and reactive to  light and accommodation.  Extraocular movements intact.  Exam of the  external ears and nose revealed no abnormalities.  Oropharynx is clear with  moist mucous membranes.  NECK:  Supple without lymphadenopathy, thyromegaly or carotid bruits.  HEART:  Regular rate and rhythm without No murmurs rubs, or gallops.  LUNGS:  Clear to auscultation.  ABDOMEN:  Soft, obese, nontender, nondistended with active bowel sounds in  all quadrants.  There is a well-healed midline scar from her previous hernia  surgery.  EXTREMITIES:  There is 1 to 2 + right lower extremity edema with some  erythema extending from the foot up to the lower calf.  The area is not  particularly warm to touch.  She has ulcerations of the right lateral foot  as well as interdigital spaces of the right foot.  She has left groin  puncture site which is presently dressed and dry.  She has 2+ femoral pulses  bilaterally and no palpable pedal pulses although I can Doppler faint  dorsalis pedis pulse bilaterally, left slightly greater than right.  NEUROLOGIC:  Cranial nerves II-XII are grossly intact.  ASSESSMENT/PLAN:  1. This is a 77 year old  black female with an ischemic right foot and     significant peripheral vascular occlusive disease.  2. Because of her vague symptoms of chest pain and dyspnea on exertion, she     will undergo cardiac workup with an eye toward the right femoral to  peroneal bypass graft in the near future.  3. She will be admitted to [facility] and continued on local wound     care to her right foot in the interim.                                               Coral Ceo, P.A.   GC/MEDQ  D:  11/04/2002  T:  11/04/2002  Job:  [id number]

## 2010-07-22 NOTE — Telephone Encounter (Signed)
I have faxed all scripts to [facility]

## 2010-07-22 NOTE — Op Note (Signed)
NAME:  [name] [name], [name] [name]                           ACCOUNT NO.:  [id number]   MEDICAL RECORD NO.:  [id number]                   PATIENT TYPE:  OIB   LOCATION:  2869                                 FACILITY:  [facility]   PHYSICIAN:  Janetta Hora. Fields, MD               DATE OF BIRTH:  Jun 13, 1931   DATE OF PROCEDURE:  DATE OF DISCHARGE:                                 OPERATIVE REPORT   PROCEDURE:  Aortogram with bilateral lower extremity runoff and first and  second order catheterization of right leg.   INDICATIONS FOR PROCEDURE:  The patient is a 77 year old female with tissue  loss on the right foot and evidence of critical ischemia with abnormal  pulses.   PREOPERATIVE DIAGNOSIS:  Critical ischemia, right foot.   POSTOPERATIVE DIAGNOSIS:  Critical ischemia, right foot.   ANESTHESIA:  Local with IV sedation.   FINDINGS:  1. Normal renal arteries and abdominal aorta.  2. Proximal occlusion of right superficial femoral artery.  3. Single vessel runoff to the right foot via peroneal artery.   DESCRIPTION OF PROCEDURE:  After obtaining informed consent, the patient was  taken to the angio suite. The patient was placed in the supine position on  the angio suite table. Next, both groins were prepped and draped in the  usual sterile fashion. Local anesthesia was infiltrated over the left common  femoral artery. A majestic needle was then used to cannulate the left common  femoral artery. A 0.035 J tip guidewire was placed into the abdominal aorta  using the modified Seldinger technique. Next, a 5 French sheath was placed  over the guidewire and the position of the guidewire was confirmed under  fluoroscopy. A 5 French pigtail catheter was then placed through the sheath  into the abdominal aorta. An aortogram was then performed which showed that  the abdominal aorta has minimal atherosclerotic changes. Celiac access is  patent. The left and right renal arteries are widely patent without  evidence  of stenosis. There is some blunting of the distal arteries and the kidneys  bilaterally. The left and right common iliac arteries are widely patent. The  origin of the left and right internal and external iliac arteries are widely  patent. The right common femoral artery and left common femoral arteries are  widely patent. The right superficial femoral artery occludes just after its  origin. The left superficial femoral artery is heavily diseased but with no  significant focal areas of stenosis. The left popliteal artery is occluded.  The profunda femoris artery bilaterally is patent without significant  stenosis. On the right side, the leg reconstitutes a peroneal artery which  is the only vessel down the leg. This gives off a small posterior  communicating branch which occludes. This also gives off an anterior  communicating branch which is the main supply to the foot. On the left side,  the leg also reconstitutes  via a peroneal artery only which has a similar  pattern with mainly only an anterior communicating branch supplying the  foot. Next to obtain better use of the right lower extremity, a crossover  catheter was used to selectively place the catheter into the distal right  external iliac artery. This was done over a 0.035 angled Glidewire. This  shows abundant collaterals around the thigh from the profunda femoris artery  with a superficial femoral artery occlusion. The peroneal artery fills the  foot and a portion of the plantar arch through an anterior communicating  branch. There is very poor blood supply to the heel of the foot.   Next, the crossover catheter and guidewire were removed under fluoroscopic  guidance. The left groin 5 French sheath was then removed and hemostasis was  obtained with direct pressure. The patient tolerated the procedure well and  there were no complications. The patient was taken to the recovery room in  stable condition.   IMPRESSION:   1. Severe superficial femoral artery and tibioperoneal trunk disease on the     right side.  2. Severe popliteal and tibial vessel disease on the left side.                                               Janetta Hora. Fields, MD    CEF/MEDQ  D:  11/04/2002  T:  11/04/2002  Job:  [id number]

## 2010-07-22 NOTE — Op Note (Signed)
[place]  Patient:    [name] [name], [name] [name]                        MRN: [id number] Proc. Date: 09/15/99 Adm. Date:  [id number] Attending:  Sonda Primes CC:         Mardene Celeste. Lurene Shadow, M.D., 2 copies                           Operative Report  PREOPERATIVE DIAGNOSIS:  Incarcerated umbilical hernia.  POSTOPERATIVE DIAGNOSIS:  Incarcerated umbilical hernia.  OPERATION:  Repair incarcerated umbilical hernia.  SURGEON:  Mardene Celeste. Lurene Shadow, M.D.  ASSISTANT:  Nurse.  ANESTHESIA:  General.  INDICATIONS FOR PROCEDURE:  This patient is a 77 year old woman presenting with umbilical pain increasing after meals, associated with some nausea but without vomiting.  She has an incarcerated umbilical hernia.  I could not detect any bowel within the hernia.  She is brought to the operating room for repair.  DESCRIPTION OF OPERATION:  Following the induction of anesthesia, the patient was positioned supinely.  The abdomen was prepped and draped to be included in the sterile operative field.  Infraumbilical incision is made circumferentially around the umbilicus, umbilical skin as a flap and dissecting the hernia sac off the umbilical skin and dissection down to the fascia. The sac is opened and entered.  Incarcerated contents are dissected free and reduced into the peritoneal cavity.  The sac is amputated and forwarded for pathologic evaluation.  The defect is repaired with interrupted Novofil sutures placed in an inverted mattress fashion.  This is noted to be a tension free repair and wound edges were well coapted.   All areas of the dissection were then checked for hemostasis and noted to be dry.  Sponge, instrument and sharp counts verified.  Wound was closed in layers as follows: subcutaneous tissues closed with interrupted 3-0 Vicryl and skin closed with a 4-0 Monacryl and reinforced with Steri-Strips.  Sterile compressive dressing applied.   Anesthetic was reversed and patient removed from the operating room to the recovery room in stable condition, having tolerated the procedure well. D:  09/15/99 TD:  09/15/99 Job: [id number]

## 2010-07-22 NOTE — Discharge Summary (Signed)
NAME:  [name] [name], [name] [name]                           ACCOUNT NO.:  [id number]   MEDICAL RECORD NO.:  [id number]                   PATIENT TYPE:  INP   LOCATION:  NA                                   FACILITY:  [facility]   PHYSICIAN:  Janetta Hora. Fields, MD               DATE OF BIRTH:  01-29-1932   DATE OF ADMISSION:  11/24/2002  DATE OF DISCHARGE:  12/06/2002                                 DISCHARGE SUMMARY   ADMISSION DIAGNOSES:  1. Deconditioning.  2. Non-healing of right leg wounds.   PAST MEDICAL HISTORY:  1. Temporal arteritis.  2. Hypertension.  3. Irregular heart beat.  4. Status post right femoral-to-anterior-tibial bypass on November 13, 2002.   ALLERGIES:  No known allergies.   DISCHARGE DIAGNOSES:  Status post right femoral-to-anterior-tibial bypass  with healing wounds, right leg.   BRIEF HISTORY:  The patient is a 77 year old African American female with  several-month history of right foot ischemia.  She underwent a femoral-to-  anterior-tibial bypass.  She has had deconditioning and some poor-healing  wounds on the right leg after this operation.  She was admitted to the [facility]  for further rehabilitation and wound care.   HOSPITAL COURSE:  During her stay in [facility], the wounds made significant  progress and had healed to the point where they were suitable for home care.  She has also been much improved from a physical conditioning and  occupational therapy standpoint.   The patient was discharged to home and was doing well at that point.   CONDITION ON DISCHARGE:  Improved.   DISCHARGE MEDICATIONS:  1. Premarin 0.625 mg once a day.  2. Provera 2.5 mg once a day.  3. Potassium chloride 20 mEq once a day.  4. Maxzide 37.5/25 mg once daily.  5. Prednisone 5 mg once daily.  6. Verapamil SR 240 mg daily.  7. Coumadin 5 mg daily.  8. Lopressor 50 mg 1 p.o. b.i.d.  9. Aspirin 325 mg daily.  10.      Zocor 40 mg daily.  11.      Elavil 50 mg nightly.  12.       Multivitamin daily.  13.      Vitamin D 500 mg once per day.  14.      Milk of Magnesia 30 mL as needed for constipation.  15.      Tylox 1 to 2 every 4 to 6 hours as needed for pain.   DISCHARGE ACTIVITIES:  The patient will continue physical therapy and wound  care with home health on discharge.   DISCHARGE DIET:  Resume home diet.   FOLLOWUP:  The patient will follow up in 2 weeks with me postoperatively.  Janetta Hora. Fields, MD    CEF/MEDQ  D:  03/25/2003  T:  03/26/2003  Job:  [id number]

## 2010-07-22 NOTE — Cardiovascular Report (Signed)
NAME:  [name] [name], [name] [name]                           ACCOUNT NO.:  [id number]   MEDICAL RECORD NO.:  [id number]                   PATIENT TYPE:  OIB   LOCATION:  3714                                 FACILITY:  [facility]   PHYSICIAN:  Richard A. Alanda Amass, M.D.          DATE OF BIRTH:  12-10-1931   DATE OF PROCEDURE:  11/06/2002  DATE OF DISCHARGE:                              CARDIAC CATHETERIZATION   PROCEDURE:  Retrograde central aortic catheterization, selective coronary  angiography via Judkins technique, LV angiogram RAO, LAO projection,  subselective LIMA, RIMA, abdominal aortic angiogram midstream PA projection.   DESCRIPTION OF PROCEDURE:  The patient was brought to the second floor CP  lab in postabsorptive state after 5 mg of Valium p.o. premedication.  The  left groin was prepped, draped in the usual manner.  1% Xylocaine was used  for local anesthesia.  The LCFA was entered with single anterior puncture  using 18 thin-wall needle and a 6 French short Daig side arm sheath was  inserted without difficulty.  Catheterization was done with 6 French 4 cm  taper preformed coronary and pigtail catheters using Omnipaque dye  throughout the procedure.  LV angiogram was done in the RAO and LAO  projection at 25 ml, 14 ml per second; 20 ml, 12 ml per second respectively.  Pullback pressure of the CA showed no gradient across the aortic valve.  Subselective LIMA and RIMA were done with the right coronary catheter.  There was no subclavian or brachiocephalic stenosis.  The vertebrals were  antegrade and the IMAs were patent.  There was tortuosity of the  brachiocephalic vessels.   Abdominal  angiogram in midstream PA projection to assess renal arteries in  view of the patient's hypertension showed widely patent single renal  arteries bilaterally, no significant infrarenal abdominal aortic or proximal  iliac disease.  Fluoroscopy did not reveal any coronary, intracardiac or  valvular  calcification.   The patient tolerated the diagnostic procedure well.  She was transferred to  holding area for sheath removal and pressure hemostasis in stable condition.   PRESSURES:  1. LV:  150/0; LVEDP 16 mmHg.  2. CA:  150/80/85 mmHg.  3. There is no gradient across the aortic valve on catheter pullback.   LV angiogram in the RAO and LAO projection showed normal contracting  ventricle.  No segmental wall motion abnormality and no MR.  EF greater than  55%.   The main left coronary is normal.   The LAD was widely patent and normal throughout its course.  It was  tortuous.  The diagonal and septal perforators were normal and the LAD  coursed to the apex.   There was moderately large tortuous optional diagonal that was normal.   The circumflex was nondominant and moderate size and was normal throughout  its course.   The right coronary was a dominant vessel that was widely patent, smooth  throughout its  course and normal.  There was some catheter spasm in the  proximal portion relieved with catheter pullback.  The distal PDA and PLA  were normal.   DISCUSSION:  This 77 year old married mother of three with six grandchildren  is a nonsmoker.  She has exogenous obesity and normal lipid profile,  systemic hypertension, and peripheral arterial disease with ischemic right  foot nonhealing ulcers.  She also has history of temporal arteritis on  chronic steroid therapy.  She is under the medical care of Dr. [name].  She was referred to Dr. Darrick Penna and underwent peripheral angiography which  demonstrated right SFA occlusion amenable to right FPBBG.  Because of  history of chest discomfort, known PAD and associated risk factors including  probable metabolic syndrome, it was elected to proceed with coronary  angiography preoperatively.   Fortunately, the patient has normal coronary arteries and left ventricular  function.  She has systemic hypertension with normal renal  arteries.   From a cardiac standpoint, it is certainly okay to proceed with planned  RFPBG per Dr. Darrick Penna.  From a coronary standpoint, she probably does not  need to be on statin therapy especially with normal lipid profile now.  It  is not clear whether her peripheral arterial disease is atherosclerotic in  nature or related to her temporal arteritis; particularly, since she has  normal lipid profile, abdominal  aorta and coronary arteries.  Recommend  continued medical therapy, weight reduction and hopefully exercise program  after peripheral surgery, medical followup with Dr. [name].  The  etiology of her chest pain is not clear.  It may represent upper GI disease  so I will empirically add upper GI medications at this time, particularly on  chronic steroid therapy.  She may need further evaluation if chest pain  recurs.   CATHETERIZATION DIAGNOSES:  1. Chest pain, etiology not determined.  2. Normal coronary arteries and left ventricle.  3. Nonhealing ulcers right foot with history of temporal arteritis on     chronic steroid therapy.  4. Exogenous obesity.  5. Probable metabolic syndrome, normal lipid profile.  6. Systemic hypertension, normal renal arteries.  7. Right superficial femoral artery occlusion, nonhealing ulcers right lower     extremity with critical limb ischemia.  8. Exogenous obesity.                                                 Richard A. Alanda Amass, M.D.    RAW/MEDQ  D:  11/06/2002  T:  11/07/2002  Job:  [id number]   cc:   [name], M.D.  [address] [place], [place] [address]  Fax: [phone]   CP  lab

## 2010-07-22 NOTE — Op Note (Signed)
[name], [name]                 ACCOUNT NO.:  [id number]   MEDICAL RECORD NO.:  [id number]          PATIENT TYPE:  AMB   LOCATION:  SDC                           FACILITY:  [facility]   PHYSICIAN:  Carrington Clamp, M.D. DATE OF BIRTH:  [birth year]   DATE OF PROCEDURE:  08/02/2005  DATE OF DISCHARGE:                                 OPERATIVE REPORT   PREOPERATIVE DIAGNOSIS:  Postmenopausal bleeding.   POSTOPERATIVE DIAGNOSIS:  Postmenopausal bleeding.   PROCEDURES:  D&C hysteroscopy.   SURGEON:  Carrington Clamp, M.D.   ASSISTANT:  None.   ANESTHESIA:  LMA.   SPECIMENS:  Uterine curettings.   ESTIMATED BLOOD LOSS:  Minimal.   IV FLUIDS:  700 mL.   URINE OUTPUT:  Not measured.   HYSTEROSCOPY DEFICIT:  155 mL.   COMPLICATIONS:  None.   FINDINGS:  Postmenopausal uterus without evidence of polyps or fibroids in  the uterus.   MEDICATIONS:  None.   COUNTS:  Correct x3.   TECHNIQUE:  After adequate LMA anesthesia was achieved, the patient was  prepped and draped in the usual sterile fashion in the dorsal lithotomy  position.  The patient had her legs adjusted in the stirrups before she went  to sleep secondary to prior surgeries on her legs.  The bladder was emptied  with a red rubber catheter and the speculum was placed in the vagina.  The  cervix was grasped with a ring forceps secondary to the patient was on  Coumadin.  The cervix was dilated without Pratt dilators and the  hysteroscope passed into the cavity. The above findings were  noted. Sharp curettage was performed with a curette and the tissue collected  and sent to pathology.  The deficit was 155 mL; however, there was some  spillage on the floor.  All instruments were then withdrawn from the vagina,  the patient tolerated the procedure well and was returned to the recovery  room in stable condition.      Carrington Clamp, M.D.  Electronically Signed     MH/MEDQ  D:  08/02/2005  T:  08/02/2005  Job:   [id number]

## 2010-07-22 NOTE — Op Note (Signed)
   NAME:  [name] [name], RAMSTAD                           ACCOUNT NO.:  [id number]   MEDICAL RECORD NO.:  [id number]                   PATIENT TYPE:  AMB   LOCATION:  DSC                                  FACILITY:   PHYSICIAN:  Maisie Fus B. [name], M.D.               DATE OF BIRTH:  January 13, 1932   DATE OF PROCEDURE:  06/03/2002  DATE OF DISCHARGE:                                 OPERATIVE REPORT   PREOPERATIVE DIAGNOSIS:  Bilateral temporal headaches.   POSTOPERATIVE DIAGNOSIS:  Bilateral temporal headaches.   OPERATION/PROCEDURE:  Left temporal artery biopsy.   SURGEON:  Maisie Fus B. [name], M.D.   ANESTHESIA:  1% Xylocaine local.   DESCRIPTION OF PROCEDURE:  The patient was taken to the minor surgery room  where a prominent branch of the left temporal artery was identified.  The  area was shaved and marked with a marking pen.  The area was prepped and  draped as a sterile field.  One percent Xylocaine local was used to  infiltrate the skin and underlying subcutaneous tissue.  A longitudinal  incision about 2.5 cm in length was made.  The dissection was taken down to  a very large temporal artery.  It was about 3 mm in diameter.  About a 1 cm  segment was dissected free.  It was tied on the proximal and distal end  using 3-0 black silk.  The segment of artery was then removed and was  probably between one-half and two-thirds of a centimeter in length.  There  was essentially no bleeding.  The skin was then closed with interrupted  sutures of 4-0 nylon.  A Band-Aid was placed over the wound.                                                Thomas B. [name], M.D.    TBP/MEDQ  D:  06/03/2002  T:  06/03/2002  Job:  [id number]   cc:   Margaretmary Bayley, M.D.  [address], [address]  [place]  [place] [address]  Fax: [phone]   Santiago Glad  [address], [address]  [place]  [place] [address]  Fax: [phone]   Hermelinda Medicus, M.D.  [address]  [place] [address]  Fax:  [phone]

## 2010-07-22 NOTE — Op Note (Signed)
[facility]  Patient:    DOVE, GRESHAM Visit Number: [id number] MRN: [id number]          Service Type: DSU Location: RCRM 2550 02 Attending Physician:  Sonda Primes Dictated by:   Mardene Celeste. Lurene Shadow, M.D. Proc. Date: 02/18/01 Admit Date:  02/18/2001   CC:         Luisa Hart L. Lurene Shadow, M.D. 2 copies   Operative Report  PREOPERATIVE DIAGNOSIS:  Ventral hernia.  POSTOPERATIVE DIAGNOSIS:  Ventral hernia.  PROCEDURE:  Repair of ventral hernia.  SURGEON:  Mardene Celeste. Lurene Shadow, M.D.  ASSISTANT:  Nurse.  ANESTHESIA:  General.  INDICATIONS FOR PROCEDURE:  The patient is a 77 year old woman who has a ventral hernia extending into the epigastrium just above the umbilicus.  She has had a previous umbilical hernia repair in the past.  She has been having recurrent episodes of incarceration which bring her to the emergency room where she has it reduced.  She comes to the operating room now for repair.  DESCRIPTION OF PROCEDURE:  Following the induction of anesthesia, the patient positioned supinely, the abdomen is routinely prepped and draped to be included in the sterile field, an incision is carried down from the epigastrium around the umbilicus into the skin and subcutaneous tissues, carried down to a large hernia sac.  Dissected free on all sides down to the fascia.  The sac was opened, and contents reduced back into the peritoneal cavity.  The sac was then removed, and hemostasis obtained around the sac. Adhesions to the edges of the defect were taken down.  Just below the defect I placed a slurry of seprafilm, and then I sewed in a medium Prolene plug using interrupted sutures of #1 Novofil.  Sponge, instrument, and Sharp counts were verified.  The repair appeared to be intact.  The subcutaneous tissues were then closed with a running 2-0 Vicryl suture, and the skin was closed with a running 4-0 Monocryl, and reinforced with Steri-Strips.   A sterile dressing was applied after Marcaine 0.5% with epinephrine 1:200,000 was injected around the wound area for additional analgesia. Dictated by:   Mardene Celeste. Lurene Shadow, M.D. Attending Physician:  Sonda Primes DD:  02/18/01 TD:  02/18/01 Job: [id number]

## 2010-07-22 NOTE — Telephone Encounter (Signed)
Call [facility] pharmacy [phone] for tenoretic 100/25 #30 0rf simvastain 80mg  #30 0 rf

## 2010-07-22 NOTE — Discharge Summary (Signed)
NAME:  [name] [name], [name] [name]                           ACCOUNT NO.:  [id number]   MEDICAL RECORD NO.:  [id number]                   PATIENT TYPE:  OIB   LOCATION:  3714                                 FACILITY:  [facility]   PHYSICIAN:  Janetta Hora. Fields, MD               DATE OF BIRTH:  1931-04-01   DATE OF ADMISSION:  11/04/2002  DATE OF DISCHARGE:  11/08/2002                                 DISCHARGE SUMMARY   ADMISSION DIAGNOSES:  1. Nonhealing ulcer, right foot secondary to right superficial femoral     artery occlusion.  2. Hypertension.  3. Temporal arteritis, on chronic steroids.  4. History of irregular heart beat.  5. Obesity.   DISCHARGE DIAGNOSES:  1. Nonhealing ulcer, right foot secondary to right superficial femoral     artery occlusion.  2. Hypertension.  3. Temporal arteritis, on chronic steroids.  4. History of irregular heart beat.  5. Obesity.   PROCEDURES:  1. Aortogram with bilateral lower extremity runoff, first and second order     catheterization right leg, November 04, 2002, Dr. Darrick Penna.  2. Cardiac catheterization, November 06, 2002, Dr. Alanda Amass.  Richard A.     Alanda Amass, M.D.   BRIEF HISTORY:  The patient is a 77 year old black female, medical patient  of Dr. [name], referred to CVTS and Dr. Darrick Penna.  She has a six week  history of right foot pain which is worsening.  She has also developed  burning and pain in her right foot and now has ulcers between the digits of  her right lower extremity along with significant swelling.  She was  initially treated, by Dr. Aldean Baker, with topical medications as well as  p.o. antibiotics and dressing changes without improvement.  She denies  specific claudication rest pain or hip pain.  An office visit, to Dr. Balinda Quails, revealed an ankle brachial index of 0.18 on the right and 0.44  on the left.  She was subsequently scheduled for and underwent Aortogram and  right lower extremity runoff on the day of  admission.  This showed the  patient to have severe right SFA occlusion with reconstitution of the  perineal artery with marginal runoff on the right.  She was also noted to  have some popliteal and tibial disease on the left and it was Dr. Evelina Dun  opinion that she should undergo a right femoral perineal bypass graft for  limb salvage and relief of symptoms.   PAST HISTORY:  1. Temporal arteritis with positive biopsy.  2. Hypertension.  3. History of irregular heart disease.  4. Obesity.   PAST SURGERIES:  1. Right shoulder pain surgery, in 1964.  2. Bilateral cysts removed from her feet in 1970s.  3. Umbilical hernia repair, in 2001.  4. Ventral hernia repair, in 2003.   MEDICATIONS ON ADMISSION:  1. Prednisone 15 mg every day.  2. Maxzide 37.5/25,  one every day.  3. Cephalexin 500 mg t.i.d.  4. Hydrocodone p.r.n. for pain.  5. Calcium daily.  6. Vitamin E daily.   ALLERGIES:  None known.   For further history and physical, please see the dictated note.   HOSPITAL COURSE:  The patient was admitted, underwent an Arteriogram,  abdominal Arteriogram and right lower extremity runoff.  This revealed  normal renal arteries and abdominal aorta.  There is a proximal occlusion of  the right SFA with single vessel runoff to the right foot via the perineal  artery.  It was Dr. Evelina Dun opinion the patient should undergo a right fem-  pop perineal bypass graft in order to salvage her right lower extremity.  Because of her multiple problems, a cardiology consult was obtained.  She  was seen in consultation by Dr. Delorise Jackson Bradsher] and was scheduled for  cardiac catheterization.   Cardiac catheterization was performed, on November 06, 2002, it showed no  significant coronary artery disease.  LV angiogram showed ejection fraction  greater than 55%.  LV pressures was 150/0 with LV ED 16-mmHg.  CA was 150/80-  85-mmHg.  There was no gradient across the aortic valve on catheter pull  back.   After completion of the study, it was Dr. Kandis Cocking opinion that  the patient could go ahead with fem-pop bypass grafting and the patient was  scheduled for surgery.  She was tentatively to go home, November 07, 2002,  and come back next week for surgery, but she had some sinus tachycardia on  the evening of November 06, 2002 and was kept in the hospital for  observation for another 24 hours.  If her rhythm is stable, we anticipate  discharge home in the a.m., on November 08, 2002.   POSTOPERATIVE LABS:  Postoperative labs are normal with a sodium of 135,  potassium 3.6, chloride of 106, CO2 of 26, BUN of 5, creatinine at 0.7,  glucose of 90.  Her potassium was low on admission, November 05, 2002,  and this has been supplemented orally since her admission.  CBC shows a  hemoglobin of 11.7, a hematocrit of 35, a white count of 10,000, platelets  are 320,000.   CONDITION ON DISCHARGE:  Improved.      Eber Hong, P.A.                 Janetta Hora. Fields, MD    WDJ/MEDQ  D:  11/07/2002  T:  11/08/2002  Job:  [id number]   cc:   Gerlene Burdock A. Alanda Amass, M.D.  [phone] [address], [address]  [place]  [place] [address]  Fax: [phone]   [name], M.D.  [phone]. [address] [place], [place] [address]  Fax: [phone]

## 2010-07-22 NOTE — Discharge Summary (Signed)
NAME:  [name] [name], [name] [name]                           ACCOUNT NO.:  [id number]   MEDICAL RECORD NO.:  [id number]                   PATIENT TYPE:  INP   LOCATION:  2030                                 FACILITY:  [facility]   PHYSICIAN:  Janetta Hora. Fields, MD               DATE OF BIRTH:  04/17/1931   DATE OF ADMISSION:  11/04/2002  DATE OF DISCHARGE:  11/24/2002                                 DISCHARGE SUMMARY   ADMISSION DIAGNOSIS:  Chronic ischemia of the right foot with tissue loss.   PAST MEDICAL HISTORY:  1. Temporal arteritis by positive biopsy.  2. Hypertension.  3. History of irregular heart beat.  4. She denies history of coronary artery disease, diabetes mellitus,     hyperlipidemia, chronic obstructive pulmonary disease, strokes or cancer.   PAST SURGICAL HISTORY:  1. Right shoulder pinning surgery.  2. Bilateral cysts removal from feet.  3. Umbilical hernia repair.  4. Ventral hernia repair.   ALLERGIES:  No known drug allergies.   DISCHARGE DIAGNOSES:  1. Chronic ischemia of the right foot with tissue loss, status post right     femoral to anterior tibial bypass with non-reversed greater saphenous     vein.  2. Atrial fibrillation which was resolved with Coumadin.   BRIEF HISTORY:  The patient is a 77 year old white female with a several-  month history of worsening right foot pain.  Initially she felt a burning  and a intense pain in her right foot which presented intermittently.  However, the patient said it developed over several weeks and became more  persistent and debilitating.  She had developed ulcerations on the lateral  aspect of the right foot as well as in the interdigital spaces.  Initially,  she was evaluated by Dr. Aldean Baker, and later at the wound-care center at  [facility].  She had been treated in the past with topical medications as  well as p.o. antibiotics, and dressing changes with little improvement.  She  denied any specific claudication  symptoms, rest pain, hip or buttocks pain.  She was seen by Dr. Liliane Bade in the CVTS office and ABI indices showed  0.18 on the right and 0.44 on the left.  Because of her severe vascular  compromise on the right, it was recommended that she proceed with an  arteriogram.  This was performed on January 04, 2003, at [facility]  by Dr. Darrick Penna.  She was found to have right superficial artery occlusion  with reconstitution of the peroneal artery, marginal runoff on the right.  She was also noted to have some popliteal and tibial disease on the left.  Dr. Darrick Penna recommended that she proceed with peripheral revascularization  surgery.   Because of her vague symptoms of chest pain and dyspnea on exertion, the  patient was evaluated by cardiology and underwent a workup including a  cardiac catheterization.  Cardiac catheterization  was performed on November 06, 2002 by Dr. Susa Griffins which revealed normal coronary arteries and  left ventricle.  She was cleared from a cardiac standpoint for lower-  extremity revascularization by Dr. Darrick Penna.   The patient was then discharged on November 08, 2002, from the hospital, in  stable condition, pending surgery the following week with Dr. Darrick Penna.  The  patient was readmitted and taken to the OR on November 13, 2002.  The  patient underwent a right femoral anterior-tibial bypass with non-reversed  and intraoperative arteriogram x2.  The patient tolerated the procedure  well.  There were no complications.  The patient was hemodynamically stable  immediately postoperatively and was extubated without problem.  The patient  woke up from anesthesia neurologically intact.  The patient had some wound  breakdown and wound infection postoperatively.  Dressing changes were done,  and the patient was started on antibiotics.  PT and OT have been working  with the patient since the operations, and she has tolerated these both  well.  The graft has  remained patent postoperatively, and the patient has  remained stable.  However, the wounds were very slow in healing, and Dr.  Darrick Penna spoke with the patient and informed her that even though there was  evidence of wound healing, the patient may ultimately require an amputation.   On the day prior to discharge, the patient felt well, and she was excited  about going home.  She was afebrile.  Her vital signs were stable.  The  wounds were stable although the groin area still had some areas of fatty  necrosis.  The heel and lateral ulcer site were also stable and slowly  healing.  The graft is patent, but it is warm and well perfused.  There is  4+ lower-extremity edema.  The patient was felt to be stable for discharge  to her home with home-health nursing in place.  She also had a supportive  family network who can help her at home.   Home Health nursing will be responsible for dressing changes, and she will  also have PT and OT established at home.   LABORATORY DATA:  CBC on December 03, 2002, white count 10.9, hemoglobin  12.1, hematocrit 30.7, platelets 344,000.  BNP on December 03, 2002, sodium  130. Potassium 3.8, BUN 9, creatinine 0.7, glucose 93.  PT and INR on  January 04, 2003, 22.3 and 2.6.   CONDITION ON DISCHARGE:  Improved.   DISCHARGE INSTRUCTIONS:  The patient is to resume her home medications which  include:  1. Premarin 0.625 mg one p.o. daily.  2. Provera 2.5 mg one p.o. daily.  3. Potassium chloride 20 mEq, one p.o. daily.  4. Maxzide 37.5 per 25 mg, one p.o. daily.  5. Prednisone 5 mg one p.o. daily.  6. Verapamil SR 240 mg one p.o. daily.  7. Coumadin 5 mg one p.o. daily.  8. Lopressor 50 mg one p.o. b.i.d.  9. Aspirin 325 mg one p.o. daily.  10.      Zocor 40 mg one p.o. daily.  11.      Elavil 50 mg q.h.s.  12.      Multivitamin one p.o. daily.  13.      Vitamin D, 500 mg one p.o. daily. 14.      Milk of Magnesia 30 milliliter as needed for  constipation.  15.      For pain management, Tylox 1-2 q.4-6h, p.r.n. pain.   DISCHARGE ACTIVITIES:  1. Per physical therapy.  2. Dressing changes by Home Health nurse.   DISCHARGE DIET:  Resume home diet.    SPECIAL INSTRUCTIONS:  1. Home care number [phone].  2. Nurse, physical and occupational therapy will call the patient to set up     and establish appointments.   FOLLOWUP:  The patient is to see Dr. Darrick Penna on December 19, 2002, at 1:15  p.m.      Pecola Leisure, PA                      Charles E. Fields, MD    AY/MEDQ  D:  12/05/2002  T:  12/07/2002  Job:  [id number]   cc:   Janetta Hora. Fields, MD  [address], [place] [address]   [name], M.D.  [phone] [place], [place] [address]  Fax: [phone]

## 2010-09-28 ENCOUNTER — Encounter: Payer: Self-pay | Admitting: Family Medicine

## 2010-10-24 ENCOUNTER — Encounter: Payer: Self-pay | Admitting: Family Medicine

## 2010-10-24 ENCOUNTER — Ambulatory Visit (INDEPENDENT_AMBULATORY_CARE_PROVIDER_SITE_OTHER): Payer: Medicare Other | Admitting: Family Medicine

## 2010-10-24 VITALS — BP 120/76 | HR 60 | Temp 97.7°F | Wt 203.0 lb

## 2010-10-24 DIAGNOSIS — J209 Acute bronchitis, unspecified: Secondary | ICD-10-CM

## 2010-10-24 MED ORDER — AMOXICILLIN 875 MG PO TABS: 875.0000 mg | ORAL_TABLET | Freq: Two times a day (BID) | ORAL | Status: AC

## 2010-10-24 NOTE — Progress Notes (Signed)
  Subjective:    Patient ID: [name] [name], female    DOB: 06/23/1931, 77 y.o.   MRN: [id number]  HPI She complains of a several week history of cough that is intermittently productive but no fever, chills, sore throat. She continues on medications listed in the chart. She has no other complaints. She does not smoke or   Review of Systems     Objective:   Physical Exam alert and in no distress. Tympanic membranes and canals are normal. Throat is clear. Tonsils are normal. Neck is supple without adenopathy or thyromegaly. Cardiac exam shows a regular sinus rhythm without murmurs or gallops. Lungs are clear to auscultation.        Assessment & Plan:  Bronchitis Amoxil. Call if no better in one week

## 2010-10-24 NOTE — Patient Instructions (Signed)
Call if no better in one week

## 2010-12-15 ENCOUNTER — Encounter (INDEPENDENT_AMBULATORY_CARE_PROVIDER_SITE_OTHER): Payer: Medicare Other | Admitting: *Deleted

## 2010-12-15 DIAGNOSIS — I70229 Atherosclerosis of native arteries of extremities with rest pain, unspecified extremity: Secondary | ICD-10-CM

## 2010-12-15 DIAGNOSIS — Z48812 Encounter for surgical aftercare following surgery on the circulatory system: Secondary | ICD-10-CM

## 2010-12-22 ENCOUNTER — Encounter: Payer: Self-pay | Admitting: Vascular Surgery

## 2010-12-22 NOTE — Procedures (Unsigned)
BYPASS GRAFT EVALUATION  INDICATION:  Right lower extremity bypass graft.  HISTORY: Diabetes:  No. Cardiac:  No. Hypertension:  Yes. Smoking:  No. Previous Surgery:  Right femoral to anterior tibial artery bypass graft on 11/13/2002.  SINGLE LEVEL ARTERIAL EXAM                              RIGHT              LEFT Brachial: Anterior tibial: Posterior tibial: Peroneal: Ankle/brachial index:  PREVIOUS ABI:  Date:  RIGHT:  LEFT:  LOWER EXTREMITY BYPASS GRAFT DUPLEX EXAM:  DUPLEX:  Biphasic Doppler waveforms noted throughout the right lower extremity bypass graft with no increase in velocities.  IMPRESSION: 1. Patent right lower extremity bypass graft with no evidence of     stenosis. 2. Ankle brachial indices are noted on the attached worksheet.  ___________________________________________ Janetta Hora. Fields, MD  CH/MEDQ  D:  12/15/2010  T:  12/15/2010  Job:  [id number]

## 2010-12-26 ENCOUNTER — Encounter: Payer: Self-pay | Admitting: Family Medicine

## 2010-12-27 ENCOUNTER — Other Ambulatory Visit: Payer: Self-pay | Admitting: Vascular Surgery

## 2010-12-27 DIAGNOSIS — Z48812 Encounter for surgical aftercare following surgery on the circulatory system: Secondary | ICD-10-CM

## 2010-12-27 DIAGNOSIS — I739 Peripheral vascular disease, unspecified: Secondary | ICD-10-CM

## 2011-02-07 ENCOUNTER — Encounter: Payer: Self-pay | Admitting: Medical

## 2011-02-07 ENCOUNTER — Ambulatory Visit (INDEPENDENT_AMBULATORY_CARE_PROVIDER_SITE_OTHER): Payer: Medicare Other | Admitting: Medical

## 2011-02-07 VITALS — BP 150/80 | HR 76 | Temp 97.6°F | Resp 16 | Wt 209.0 lb

## 2011-02-07 DIAGNOSIS — R042 Hemoptysis: Secondary | ICD-10-CM | POA: Insufficient documentation

## 2011-02-07 DIAGNOSIS — I517 Cardiomegaly: Secondary | ICD-10-CM | POA: Insufficient documentation

## 2011-02-07 DIAGNOSIS — R062 Wheezing: Secondary | ICD-10-CM | POA: Insufficient documentation

## 2011-02-07 DIAGNOSIS — R0602 Shortness of breath: Secondary | ICD-10-CM

## 2011-02-07 DIAGNOSIS — R059 Cough, unspecified: Secondary | ICD-10-CM | POA: Insufficient documentation

## 2011-02-07 DIAGNOSIS — R05 Cough: Secondary | ICD-10-CM

## 2011-02-07 LAB — CBC WITH DIFFERENTIAL/PLATELET
Hemoglobin: 13.3 g/dL (ref 12.0–15.0)
Lymphocytes Relative: 39 % (ref 12–46)
MCH: 31.8 pg (ref 26.0–34.0)
Neutro Abs: 2.4 10*3/uL (ref 1.7–7.7)
Neutrophils Relative %: 49 % (ref 43–77)
RDW: 13.4 % (ref 11.5–15.5)

## 2011-02-07 LAB — COMPREHENSIVE METABOLIC PANEL
ALT: 17 U/L (ref 0–35)
Albumin: 4.2 g/dL (ref 3.5–5.2)
BUN: 14 mg/dL (ref 6–23)
Chloride: 99 mEq/L (ref 96–112)
Glucose, Bld: 98 mg/dL (ref 70–99)
Potassium: 3.5 mEq/L (ref 3.5–5.3)
Sodium: 133 mEq/L — ABNORMAL LOW (ref 135–145)

## 2011-02-07 LAB — TSH: TSH: 0.513 u[IU]/mL (ref 0.350–4.500)

## 2011-02-07 MED ORDER — AZITHROMYCIN 250 MG PO TABS: ORAL_TABLET | ORAL | Status: AC

## 2011-02-07 MED ORDER — BENZONATATE 100 MG PO CAPS: 100.0000 mg | ORAL_CAPSULE | Freq: Four times a day (QID) | ORAL | Status: DC | PRN

## 2011-02-07 NOTE — Progress Notes (Signed)
Subjective:  HPI  [name] [name] is a 77 y.o. female who presents for possible bronchitis.  Was seen in August for similar here with Dr. Susann Givens.  She notes hx/o asthma and bronchitis, and has had some wheezing.  She had flu shot in October, was sick after a few days later.   Currently has cough, productive sputum, and is seeing some blood in the sputum x 3 episodes.  She notes a tiny amount of blood in the sputum.  She notes wheezing, shortness of breath for more than 2-3 weeks.  She reports hx/o blood in sputum in the past as well for years intermittent.  She feels like the flu shot recently broke things loose in the lungs so sputum would come up.  Using some Mucinex OTC.   No sick contacts. No other aggravating or relieving factors.  She notes hx/o cardiac workup in 2004 at [facility] but not sure what tests were done.  She denies prior catheterization or treadmill stress test.  No other c/o.  The following portions of the patient's history were reviewed and updated as appropriate: allergies, current medications, past family history, past medical history, past social history, past surgical history and problem list.  Past Medical History Diagnosis Date . Arthritis  . Hypertension  . PVD (peripheral vascular disease)  . GERD (gastroesophageal reflux disease)  . Obesity  . Menopause  . Dyslipidemia  . Asthma  . Diverticulosis    Review of Systems Constitutional: -fever, +chills, +sweats, -unexpected -weight change,-fatigue ENT: +runny nose, -ear pain, +sore throat Cardiology:  -chest pain, -palpitations, -edema Respiratory: +cough, +shortness of breath, +wheezing Gastroenterology: -abdominal pain, +nausea, -vomiting, -diarrhea, +constipation Hematology: -bleeding or bruising problems Musculoskeletal: -arthralgias, -myalgias, -joint swelling, -back pain Ophthalmology: -vision changes Urology: -dysuria, -difficulty urinating, -hematuria, -urinary frequency, -urgency Neurology:  -headache, -weakness, -tingling, -numbness   Objective:   Filed Vitals:   02/07/11 0818  BP: 150/80  Pulse: 76  Temp: 97.6 F (36.4 C)  Resp: 16    General appearance: Alert, WD/WN, no distress, obese                             Skin: warm, no rash, no diaphoresis                           Head: no sinus tenderness                            Eyes: conjunctiva normal, corneas clear, PERRLA                            Ears: pearly TMs, external ear canals normal                          Nose: septum midline, turbinates swollen, with erythema and clear discharge             Mouth/throat: MMM, tongue normal, mild pharyngeal erythema                           Neck: supple, no adenopathy, no thyromegaly, nontender                          Heart: RRR, normal S1, S2, no murmurs  Lungs: +bronchial breath sounds, +scattered rhonchi, no wheezes, no rales                Extremities: no edema, nontender     Assessment and Plan:   Encounter Diagnoses  Name Primary?  . Cough Yes  . Hemoptysis   . Wheezing   . Shortness of breath     CXR today with cardoimegaly, some patchiness over left costovertebral angle, otherwise unremarkable CXR.  Will send for CXR overread.   Discussed case with Dr. Susann Givens, supervising physician.  Prescription given today for Zpak and Tessalon Perles as below.  Discussed possible etiologies for her cough x 3-4 wk+.  We will also gets some labs today and set her up for echocardiogram given the CXR findings and history.  Call/return in 2-3 days if symptoms are worse or not improving.  Go to the ED if much worse, otherwise f/u pending labs, echocardiogram.

## 2011-02-07 NOTE — Progress Notes (Deleted)
  Subjective:    Patient ID: [name] [name], female    DOB: April 22, 1931, 77 y.o.   MRN: [id number]  HPI    Review of Systems     Objective:   Physical Exam        Assessment & Plan:

## 2011-02-22 ENCOUNTER — Ambulatory Visit (HOSPITAL_COMMUNITY): Payer: Medicare Other

## 2011-02-23 ENCOUNTER — Telehealth: Payer: Self-pay | Admitting: Family Medicine

## 2011-02-23 ENCOUNTER — Emergency Department (HOSPITAL_COMMUNITY)
Admission: EM | Admit: 2011-02-23 | Discharge: 2011-02-23 | Disposition: A | Discharge status: Home / Self Care | Payer: Medicare Other | Attending: Emergency Medicine | Admitting: Emergency Medicine

## 2011-02-23 ENCOUNTER — Encounter (HOSPITAL_COMMUNITY): Payer: Self-pay | Admitting: *Deleted

## 2011-02-23 DIAGNOSIS — K219 Gastro-esophageal reflux disease without esophagitis: Secondary | ICD-10-CM | POA: Insufficient documentation

## 2011-02-23 DIAGNOSIS — E785 Hyperlipidemia, unspecified: Secondary | ICD-10-CM | POA: Insufficient documentation

## 2011-02-23 DIAGNOSIS — R05 Cough: Secondary | ICD-10-CM

## 2011-02-23 DIAGNOSIS — R059 Cough, unspecified: Secondary | ICD-10-CM | POA: Insufficient documentation

## 2011-02-23 DIAGNOSIS — I739 Peripheral vascular disease, unspecified: Secondary | ICD-10-CM | POA: Insufficient documentation

## 2011-02-23 DIAGNOSIS — J45909 Unspecified asthma, uncomplicated: Secondary | ICD-10-CM | POA: Insufficient documentation

## 2011-02-23 DIAGNOSIS — I10 Essential (primary) hypertension: Secondary | ICD-10-CM | POA: Insufficient documentation

## 2011-02-23 DIAGNOSIS — E669 Obesity, unspecified: Secondary | ICD-10-CM | POA: Insufficient documentation

## 2011-02-23 NOTE — ED Notes (Signed)
Pt last saw PCP 2 weeks ago when c/o cold, cough. Reports doctor's office called her last Friday with an appt. Instructed her to go to [facility] Thursday 02-23-11 at 1000. Something to do with her heart?

## 2011-02-23 NOTE — ED Provider Notes (Signed)
History     CSN: [id number]  Arrival date & time 02/23/11  7829   First MD Initiated Contact with Patient 02/23/11 1025      Chief Complaint  Patient presents with  . Cough    (Consider location/radiation/quality/duration/timing/severity/associated sxs/prior treatment) HPI.... patient has had cough for a couple weeks. We'll scheduled an echocardiogram yesterday at [facility]. Patient came to the hospital today thinking test was today. No chest pain or shortness of breath.  No fever sweats or chills  Past Medical History  Diagnosis Date  . Arthritis   . Hypertension   . PVD (peripheral vascular disease)   . GERD (gastroesophageal reflux disease)   . Obesity   . Menopause   . Dyslipidemia   . Asthma   . Diverticulosis     Past Surgical History  Procedure Date  . Eye surgery     CATARACT (BILATERAL)  . Hernia repair   . Brain surgery     No family history on file.  History  Substance Use Topics  . Smoking status: Never Smoker   . Smokeless tobacco: Never Used  . Alcohol Use: No    OB History    Grav Para Term Preterm Abortions TAB SAB Ect Mult Living                  Review of Systems  All other systems reviewed and are negative.    Allergies  Review of patient's allergies indicates no known allergies.  Home Medications   Current Outpatient Rx  Name Route Sig Dispense Refill  . TYLENOL ARTHRITIS EXT RELIEF PO Oral Take 2 tablets by mouth every evening.      . ASPIRIN 81 MG PO TABS Oral Take 81 mg by mouth daily.      . ATENOLOL-CHLORTHALIDONE 100-25 MG PO TABS Oral Take 1 tablet by mouth daily.      [name] BENZONATATE 100 MG PO CAPS Oral Take 1 capsule (100 mg total) by mouth every 6 (six) hours as needed for cough. 30 capsule 0  . OMEGA-3 FATTY ACIDS 1000 MG PO CAPS Oral Take 1 g by mouth daily.      . OSTEO BI-FLEX ADV JOINT SHIELD PO Oral Take 1 tablet by mouth daily.      . ADULT MULTIVITAMIN W/MINERALS CH Oral Take 1 tablet by mouth daily.      [name]  NAPROXEN SODIUM 220 MG PO TABS Oral Take 440 mg by mouth 2 (two) times daily with a meal.      . OVER THE COUNTER MEDICATION Oral Take 1 tablet by mouth daily. Vitamin B-12    . OVER THE COUNTER MEDICATION Oral Take 1 tablet by mouth daily. Vitamin D OTC     . SIMVASTATIN 80 MG PO TABS Oral Take 40 mg by mouth daily.       BP 99/71  Pulse 65  Temp(Src) 97.9 F (36.6 C) (Oral)  Resp 16  Ht 5\' 2"  (1.575 m)  Wt 200 lb (90.719 kg)  BMI 36.58 kg/m2  SpO2 97%  Physical Exam  Nursing note and vitals reviewed. Constitutional: She is oriented to person, place, and time. She appears well-developed and well-nourished.       Obese  HENT:  Head: Normocephalic and atraumatic.  Eyes: Conjunctivae and EOM are normal. Pupils are equal, round, and reactive to light.  Neck: Normal range of motion. Neck supple.  Cardiovascular: Normal rate and regular rhythm.   Pulmonary/Chest: Effort normal and breath sounds normal.  Abdominal: Soft. Bowel sounds are normal.  Musculoskeletal: Normal range of motion.  Neurological: She is alert and oriented to person, place, and time.  Skin: Skin is warm and dry.  Psychiatric: She has a normal mood and affect.    ED Course  Procedures (including critical care time)  Labs Reviewed - No data to display No results found.   1. Cough       MDM  Patient checked into the emergency department accidentally.  Her echocardiogram will need to be rescheduled.  She is in no acute distress        Donnetta Hutching, MD 02/23/11 1128

## 2011-02-23 NOTE — ED Notes (Signed)
Informed patient and/or family of status.  

## 2011-02-23 NOTE — ED Notes (Signed)
Called Dr. Jola Babinski office to inquire. Pt was to go to [facility] Echo lab for an echo 02-22-11 at 1000. Pt did not have it done. Spoke with Lafonda Mosses at office & informed that they will call pt with rescheduled appt.

## 2011-02-23 NOTE — Telephone Encounter (Signed)
Rcd call from Emergency Dept today.  Pt [name] [name] showed up there and she didn't know what she was to be there.  It appears she may have had a Echo scheduled for yesterday per ED dept.  Please review and r/s Echo if needed and be sure the pt understands the details as ER said she was having memory issues. I cannot tell in chart where Echo or when Echo was scheduled.  Please notate.

## 2011-02-24 NOTE — Telephone Encounter (Signed)
I SPOKE WITH THE PATIENT AND I WILL RESCHEDULE HER APPT. CLS  HER APPOINTMENT HAS BEEN RESCHEDULED FOR 03/14/11 @ 1000AM. CLS

## 2011-03-14 ENCOUNTER — Ambulatory Visit (HOSPITAL_COMMUNITY): Payer: Medicare Other | Attending: Medical

## 2011-06-28 ENCOUNTER — Ambulatory Visit (INDEPENDENT_AMBULATORY_CARE_PROVIDER_SITE_OTHER): Payer: Medicare Other | Admitting: Medical

## 2011-06-28 ENCOUNTER — Encounter: Payer: Self-pay | Admitting: Medical

## 2011-06-28 VITALS — BP 120/80 | HR 56 | Temp 98.1°F | Resp 16 | Ht 66.0 in | Wt 198.0 lb

## 2011-06-28 DIAGNOSIS — R0600 Dyspnea, unspecified: Secondary | ICD-10-CM

## 2011-06-28 DIAGNOSIS — I517 Cardiomegaly: Secondary | ICD-10-CM

## 2011-06-28 DIAGNOSIS — Z1239 Encounter for other screening for malignant neoplasm of breast: Secondary | ICD-10-CM

## 2011-06-28 DIAGNOSIS — R0989 Other specified symptoms and signs involving the circulatory and respiratory systems: Secondary | ICD-10-CM

## 2011-06-28 DIAGNOSIS — R29890 Loss of height: Secondary | ICD-10-CM

## 2011-06-28 DIAGNOSIS — E785 Hyperlipidemia, unspecified: Secondary | ICD-10-CM

## 2011-06-28 DIAGNOSIS — I10 Essential (primary) hypertension: Secondary | ICD-10-CM

## 2011-06-28 DIAGNOSIS — M1990 Unspecified osteoarthritis, unspecified site: Secondary | ICD-10-CM

## 2011-06-28 LAB — COMPREHENSIVE METABOLIC PANEL
AST: 24 U/L (ref 0–37)
Alkaline Phosphatase: 57 U/L (ref 39–117)
CO2: 26 mEq/L (ref 19–32)
Calcium: 9.6 mg/dL (ref 8.4–10.5)
Chloride: 101 mEq/L (ref 96–112)
Total Bilirubin: 0.4 mg/dL (ref 0.3–1.2)

## 2011-06-28 LAB — LIPID PANEL
Cholesterol: 104 mg/dL (ref 0–200)
HDL: 48 mg/dL (ref >39)
Total CHOL/HDL Ratio: 2.2 Ratio
VLDL: 16 mg/dL (ref 0–40)

## 2011-06-28 NOTE — Progress Notes (Signed)
Subjective:   HPI  [name] [name] is a 78 y.o. female who presents for a complete physical/general recheck.  Needs refills, has several concerns.    Sister died with breast cancer last year age 51yo.   Last mammogram over a year ago.  Wants to get re screened with mammogram.  Last colonoscopy 2005, advised to repeat in 10 years.   Son died recent last month age 14yo.  She wants to make sure her heart is fine.  No prior cardiology eval that she is aware of. She has hx/p PVD, sees vein specialist once yearly.    She uses triamcinolone for itchy rough skin that flares up. Also uses daily lotion.   Reviewed their medical, surgical, family, social, medication, and allergy history and updated chart as appropriate.  Past Medical History  Diagnosis Date  . Arthritis   . Hypertension   . PVD (peripheral vascular disease)   . GERD (gastroesophageal reflux disease)   . Obesity   . Menopause   . Dyslipidemia   . Asthma   . Diverticulosis   . Hemorrhoids   . Allergy   . History of atrial fibrillation   . Cataract     Past Surgical History  Procedure Date  . Eye surgery     CATARACT (BILATERAL)  . Hernia repair     abdominal x 2  . Temporal artery biopsy / ligation   . Colonoscopy 02/2004  . Shoulder surgery     age 27 due to dislocation    Family History  Problem Relation Age of Onset  . Cancer Sister     died of breast cancer  . Mental retardation Paternal Aunt     History   Social History  . Marital Status: Married    Spouse Name: N/A    Number of Children: N/A  . Years of Education: N/A   Occupational History  . Not on file.   Social History Main Topics  . Smoking status: Never Smoker   . Smokeless tobacco: Never Used  . Alcohol Use: No  . Drug Use: No  . Sexually Active: Not on file   Other Topics Concern  . Not on file   Social History Narrative   Lives at home with husband, exercise some with water aerobics 2 days per week, has 3 children, 6 grandchildren,  2 great grandchildren    Current Outpatient Prescriptions on File Prior to Visit  Medication Sig Dispense Refill  . Acetaminophen (TYLENOL ARTHRITIS EXT RELIEF PO) Take 2 tablets by mouth every evening.        [name] aspirin 81 MG tablet Take 81 mg by mouth daily.        [name] atenolol-chlorthalidone (TENORETIC) 100-25 MG per tablet Take 1 tablet by mouth daily.        . fish oil-omega-3 fatty acids 1000 MG capsule Take 1 g by mouth daily.        . Misc Natural Products (OSTEO BI-FLEX ADV JOINT SHIELD PO) Take 1 tablet by mouth daily.        . Multiple Vitamin (MULITIVITAMIN WITH MINERALS) TABS Take 1 tablet by mouth daily.        [name] OVER THE COUNTER MEDICATION Take 1 tablet by mouth daily. Vitamin B-12      . OVER THE COUNTER MEDICATION Take 1 tablet by mouth daily. Vitamin D OTC       . simvastatin (ZOCOR) 80 MG tablet Take 40 mg by mouth daily.  No Known Allergies   Review of Systems Constitutional: denies fever, chills, sweats, unexpected weight change, anorexia, fatigue Allergy: negative; denies recent sneezing,congestion Dermatology: denies changing moles, rash, lumps, new worrisome lesions ENT: no runny nose, ear pain, sore throat, hoarseness, sinus pain, teeth pain, tinnitus, hearing loss, epistaxis Cardiology: denies chest pain, palpitations, edema, orthopnea, paroxysmal nocturnal dyspnea Respiratory: denies cough, -shortness of breath, +dyspnea on exertion, -wheezing, -hemoptysis Gastroenterology: denies abdominal pain, nausea, vomiting, diarrhea, constipation, blood in stool, changes in bowel movement, dysphagia Hematology: denies bleeding or bruising problems Musculoskeletal: +arthralgias; denies myalgias, joint swelling, back pain, neck pain, cramping, gait changes Ophthalmology: denies vision changes, eye redness, itching, discharge Urology: denies dysuria, difficulty urinating, hematuria, urinary frequency, urgency, incontinence Neurology: no headache, weakness, tingling,  numbness, speech abnormality, memory loss, falls, dizziness Psychology: denies depressed mood, agitation, sleep problems     Objective:   Physical Exam  Filed Vitals:   06/28/11 0912  BP: 120/80  Pulse: 56  Temp: 98.1 F (36.7 C)  Resp: 16    General appearance: alert, no distress, WD/WN, black female Skin: few scattered benign appearing lesions HEENT: normocephalic, conjunctiva/corneas normal, sclerae anicteric, PERRLA, EOMi, nares patent, no discharge or erythema, pharynx normal Oral cavity: MMM, tongue normal, upper and lower partial dentures, few remaining natural teeth Neck: supple, no lymphadenopathy, no thyromegaly, no masses, normal ROM, no bruits Chest: non tender, normal shape and expansion Heart: occasion ectopic beat, otherwise RRR, normal S1, S2, no murmurs Lungs: CTA bilaterally, no wheezes, rhonchi, or rales Abdomen: +bs, soft, vertical surgical scar at and above umbilicus region, non tender, non distended, no masses, no hepatomegaly, no splenomegaly, no bruits Back: non tender, normal ROM, no scoliosis Musculoskeletal: right 4th finger with boutonniere deformity, angular deviation of right 2nd -5th fingers in general, some bony PIP changes noted, otherwise upper extremities non tender, no obvious deformity, normal ROM throughout, lower extremities non tender, no obvious deformity, normal ROM throughout Extremities: no edema, no cyanosis, no clubbing Pulses: 2+ symmetric, upper and lower extremities, normal cap refill Neurological: alert, oriented x 3, CN2-12 intact, strength normal upper extremities and lower extremities, sensation normal throughout, DTRs 2+ throughout, no cerebellar signs, gait normal Psychiatric: normal affect, behavior normal, pleasant  Breasts: nontender, no mass, no axillary lymphadenopathy Gyn: external exam without lesions, no speculum exam performed Rectal: normal anus, few internal hemorrhoids palpated, occult negative stool   Assessment  and Plan :    Encounter Diagnoses  Name Primary?  . Essential hypertension, benign Yes  . Hyperlipidemia   . Screening for breast cancer   . Dyspnea   . Cardiomegaly   . Osteoarthritis   . Height loss    Physical exam - discussed healthy lifestyle, diet, exercise, preventative care, vaccinations, and addressed their concerns.  She is UTD on zostavax, pneumovax, gets yearly flu shot.  Last eye exam 03/2011.   HTN - controlled on current medication  Hyperlipidemia - labs today  Will set up for mammogram.  Dyspnea and cardiomegaly on recent CXR.  Will refer to Dr. Donnie Aho for consult and possible baseline stress test.  Osteoarthritis - quite pronounced in right hand.  Uses tylenol arthritis.  Height loss - will set up for Dexa Scan. Advised Ca+D.    Follow-up pending labs/studies.

## 2011-06-29 ENCOUNTER — Encounter: Payer: Self-pay | Admitting: Medical

## 2011-06-30 ENCOUNTER — Telehealth: Payer: Self-pay | Admitting: Family Medicine

## 2011-06-30 NOTE — Telephone Encounter (Signed)
MAMMOGRAM AND DEXA SCAN IS SCHEDULED AT [facility] FOR [facility]EXT [facility]TH AND HER APPT. WITH DR. TILLEY IS ON 07/04/11 @ 1100 AM. CLS

## 2011-06-30 NOTE — Telephone Encounter (Signed)
Message copied by Janeice Robinson on Fri Jun 30, 2011  9:29 AM ------      Message from: Aleen Campi, DAVID S      Created: Thu Jun 29, 2011  5:24 AM       1) set up dexa scan for bone density screening      2) set up mammogram for screening      3) refer to Dr. Donnie Aho for consult for possible stress test - RE: dyspnea on exertion, ectopic beats, cardiomegaly on CXR.

## 2011-07-18 ENCOUNTER — Ambulatory Visit: Payer: Medicare Other

## 2011-07-18 ENCOUNTER — Other Ambulatory Visit: Payer: Medicare Other

## 2011-07-19 ENCOUNTER — Ambulatory Visit (INDEPENDENT_AMBULATORY_CARE_PROVIDER_SITE_OTHER): Payer: Medicare Other | Admitting: Vascular Surgery

## 2011-07-19 ENCOUNTER — Other Ambulatory Visit: Payer: Medicare Other

## 2011-07-19 DIAGNOSIS — R0989 Other specified symptoms and signs involving the circulatory and respiratory systems: Secondary | ICD-10-CM

## 2011-07-24 NOTE — Procedures (Unsigned)
CAROTID DUPLEX EXAM  INDICATION:  Carotid stenosis.  HISTORY: Diabetes:  No. Cardiac:  A-fib. Hypertension:  Yes. Smoking:  No. Previous Surgery:  No carotid interventions. CV History:  Currently asymptomatic. Amaurosis Fugax No, Paresthesias No, Hemiparesis No                                      RIGHT             LEFT Brachial systolic pressure:         140               140 Brachial Doppler waveforms:         WNL               WNL Vertebral direction of flow:        Antegrade         Antegrade DUPLEX VELOCITIES (cm/sec) CCA peak systolic                   78                87 ECA peak systolic                   108               50 ICA peak systolic                   56-P/153-D        75 ICA end diastolic                   9-P/41-D          14 PLAQUE MORPHOLOGY:                  Heterogenous      Heterogenous PLAQUE AMOUNT:                      Mild              Mild PLAQUE LOCATION:                    CCA/ICA           CCA/ICA  IMPRESSION: 1. Bilateral common carotid arteries present with vessel tortuosity. 2. Right external carotid artery stenosis present in the 1-39% range,     velocities are elevated distally most likely due to vessel     tortuosity. 3. Bilateral external carotid arteries appear patent. 4. Left internal carotid artery stenosis present in the 1-39% range. 5. Bilateral vertebral arteries are patent and antegrade.  ___________________________________________ Janetta Hora. Fields, MD  SH/MEDQ  D:  07/19/2011  T:  07/19/2011  Job:  [id number]

## 2011-08-01 ENCOUNTER — Ambulatory Visit
Admission: RE | Admit: 2011-08-01 | Discharge: 2011-08-01 | Disposition: A | Discharge status: Home / Self Care | Payer: Medicare Other | Source: Ambulatory Visit | Attending: Medical | Admitting: Medical

## 2011-08-01 ENCOUNTER — Telehealth: Payer: Self-pay | Admitting: Medical

## 2011-08-01 DIAGNOSIS — R29890 Loss of height: Secondary | ICD-10-CM

## 2011-08-01 DIAGNOSIS — Z1239 Encounter for other screening for malignant neoplasm of breast: Secondary | ICD-10-CM

## 2011-08-02 ENCOUNTER — Other Ambulatory Visit: Payer: Self-pay | Admitting: Medical

## 2011-08-02 MED ORDER — ATENOLOL-CHLORTHALIDONE 100-25 MG PO TABS: 1.0000 | ORAL_TABLET | Freq: Every day | ORAL | Status: DC

## 2011-08-02 MED ORDER — TRIAMCINOLONE ACETONIDE 0.5 % EX CREA: TOPICAL_CREAM | Freq: Three times a day (TID) | CUTANEOUS | Status: DC

## 2011-08-02 MED ORDER — SIMVASTATIN 80 MG PO TABS: 40.0000 mg | ORAL_TABLET | Freq: Every day | ORAL | Status: DC

## 2011-08-02 NOTE — Telephone Encounter (Signed)
Patient states that she needs a refill on the Triamicinolone cream .5%. She states that she does not use this everyday just when she has allergy reactions. She said she has uased this cream since she was 78 years old. CLS  Patient was notified that her medication was sent into the pharmacy. CLS

## 2011-08-02 NOTE — Telephone Encounter (Signed)
i apologize for any inconvenience.  Not sure if this was a Ship broker or what.  I resent Simvastatin and Tenoretic (BP pill) this morning for 90 day supply.  I'm not sure what the 3rd medication is that she is requesting. Most of the other medications listed are either OTC or creams, and she shouldn't be using steroid creams like Triamcinolone EVERY DAY.  Let me know about the 3rd medication requested?

## 2011-08-15 ENCOUNTER — Encounter: Payer: Self-pay | Admitting: Medical

## 2011-08-17 ENCOUNTER — Other Ambulatory Visit: Payer: Self-pay | Admitting: Cardiology

## 2011-08-17 NOTE — Progress Notes (Signed)
Quincy Carnes    Date of visit:  08/17/2011 DOB:  1931/09/20    Age:  78 yrs. Medical record number:  [id number]     Account number:  [id number] Primary Care Provider: Ernst Breach ____________________________ CURRENT DIAGNOSES  1. Palpitations  2. Dyspnea  3. Hypertensive Heart Disease-Benign without CHF  4. Arrhythmia-Atrial Fibrillation  5. Cardiomegaly  6. Peripheral Vascular Disease  7. Hyperlipidemia  8. Obesity(BMI30-40)  9. GERD  10. Polymyalgia Rheumatica  11. Conduction Disorder-Left Bundle Branch Block  12. Giant Cell Arteritis ____________________________ ALLERGIES  Penicillin - Natural (i.e. Pen G, Pen V) ____________________________ MEDICATIONS  1. Tylenol 325 mg tablet, 2 qd +PRN  2. aspirin 81 mg tablet, chewable, 1 p.o. daily  3. atenolol-chlorthalidone 100-25 mg tablet, 1 p.o. daily  4. Fish Oil 1,000 mg capsule, 1 p.o. daily  5. Osteo Bi-Flex 250-200 mg tablet, 1 p.o. daily  6. multivitamin tablet, 1 p.o. daily  7. Vitamin B-12 500 mcg tablet, 1 p.o. daily  8. Vitamin D3 1,000 unit tablet, 1 p.o. daily  9. simvastatin 80 mg tablet, 1/2 tab daily  10. Vitamin C 500 mg tablet, 1 p.o. daily  11. Aleve 220 mg tablet, PRN  12. magnesium 200 mg tablet, 400mg  qd  13. Calcium 600 + D(3) 600 mg(1,500mg ) -400 unit tablet, 1 p.o. daily  14. spironolactone 25 mg tablet, 1 p.o. daily ____________________________ HISTORY OF PRESENT ILLNESS  Patient seen for cardiac followup. Since she was previously here she has been feeling relatively well but continues to have some dyspnea. She has worn a cardiac event monitor in addition an occasional junctional rhythm, PVCs and PACs but no definite atrial fibrillation. A Cardiolite test did not show any ischemia. Her echocardiogram showed left ventricular hypertrophy and an ejection fraction of 45-50%. She does not have any anginal type chest pain. She denies PND, orthopnea, syncope, or claudication.    ____________________________ PAST HISTORY  Past Medical Illnesses:  hypertension, hyperlipidemia, peripheral vascular disease, osteoarthritis, GERD, asthma, history of polymylagia rheumatica, obesity;  Cardiovascular Illnesses:  atrial fibrillation, peripheral vascular disease;  Surgical Procedures:  cataract extraction OU, right shoulder surgery, umbilical hernia, ventral hernia repair, temporal artery biopsy, Fem-tib bypass 9/04 Dr. Darrick Penna, D and C;  NYHA Classification:  II;  Cardiology Procedures-Invasive:  cardiac cath (left) 2004;  Cardiology Procedures-Noninvasive:  echocardiogram, echocardiogram May 2013, lexiscan cardiolite May 2013, event monitor May 2013;  Cardiac Cath Results:  no significant disease;  Peripheral Vascular Procedures:  carotid doppler May 2013   (mild plaque bilat, RECA of 1-40%);  LVEF of 45% documented via echocardiogram on 08/04/2011 ____________________________ CARDIO-PULMONARY TEST DATES EKG Date:  07/14/2011;   Cardiac Cath Date:  11/06/2002;  Holter/Event Monitor Date: 07/20/2011;  Nuclear Study Date:  07/20/2011;  Echocardiography Date: 07/19/2011;  Chest Xray Date: 02/07/2011;   ____________________________ SOCIAL HISTORY Alcohol Use:  no alcohol use;  Smoking:  never smoked;  Diet:  regular diet;  Lifestyle:  married x 29 years;  Exercise:  water aerobics;  Occupation:  Social worker and food service;  Residence:  lives with husband;   ____________________________ REVIEW OF SYSTEMS General:  obesity  Eyes:  denies diplopia, history of glaucoma or visual problems.  Respiratory:  mild dyspnea with exertion  Cardiovascular:  please review HPI  Abdominal:  denies dyspepsia, GI bleeding, constipation, or diarrhea  Genitourinary-Female:  frequency, nocturia  Musculoskeletal:  arthritis of the knees  Psychiatric:  denies depession or anxiety ____________________________ PHYSICAL EXAMINATION VITAL SIGNS  Blood Pressure:  146/84 Sitting,  Left arm, regular cuff  , 140/80  Standing, Left arm and regular cuff   Pulse:  78/min. Weight:  206.00 lbs. Height:  62"BMI: 37  Constitutional:  pleasant African American female in no acute distress, severely obese walks with cane Skin:  warm and dry to touch, no apparent skin lesions, or masses noted. Head:  normocephalic, normal hair pattern, no masses or tenderness ENT:  ears, nose and throat reveal no gross abnormalities.  Dentition good. Neck:  supple, without massess. No JVD, thyromegaly or carotid bruits. Carotid upstroke normal., prominent carotid impulse on the right Chest:  normal symmetry, clear to auscultation and percussion. Cardiac:  regular rhythm, normal S1 and S2, No S3 or S4, no murmurs, gallops or rubs detected. Peripheral Pulses:  left femoral bruit, reduced pedal pulses on the left, 1-2+ on the right Extremities & Back:  no deformities, clubbing, cyanosis, erythema or edema observed. Normal muscle strength and tone. Neurological:  no gross motor or sensory deficits noted, affect appropriate, oriented x3. ____________________________ MOST RECENT LIPID PANEL 06/29/11  CHOL TOTL 104 mg/dl, LDL 40 calc, HDL 48 mg/dl, TRIGLYCER 79 mg/dl and CHOL/HDL 2.2 (Calc) ____________________________ IMPRESSIONS/PLAN  1. At this point in time her cardiovascular workup is complete. She has evidence of hypertensive heart disease with LVH which is responsible for cardiomegaly. She needs to have excellent blood pressure control and her blood pressure is still not under good control. He would help her lose additional weight get her blood pressure under control but in the meantime I think that she needs to go on spironolactone 25 mg daily. I asked her to have a followup visit with Kristian Covey in one month to recheck her potassium 2. History of atrial fibrillation. For unknown reasons she was taken off of warfarin several years ago. She did not have evidence of recent atrial fibrillation but she does have a high CHAD2VASC  score and if she has any recurrence of atrial for ablation she should go back on Coumadin indefinitely. 3. Hypertensive heart disease with LVH 4. Obesity with need to lose weight 5. Chronic left bundle branch block ____________________________ TODAYS ORDERS  1. Return Visit: 6 months                       ____________________________ Cardiology Physician:  Darden Palmer MD [facility]

## 2011-08-31 ENCOUNTER — Ambulatory Visit (INDEPENDENT_AMBULATORY_CARE_PROVIDER_SITE_OTHER): Payer: Medicare Other | Admitting: Family Medicine

## 2011-08-31 ENCOUNTER — Encounter: Payer: Self-pay | Admitting: Family Medicine

## 2011-08-31 VITALS — BP 128/70 | HR 75 | Wt 197.0 lb

## 2011-08-31 DIAGNOSIS — Z79899 Other long term (current) drug therapy: Secondary | ICD-10-CM

## 2011-08-31 DIAGNOSIS — I10 Essential (primary) hypertension: Secondary | ICD-10-CM

## 2011-08-31 DIAGNOSIS — I517 Cardiomegaly: Secondary | ICD-10-CM

## 2011-08-31 LAB — ELECTROLYTE PANEL
CO2: 25 mEq/L (ref 19–32)
Chloride: 98 mEq/L (ref 96–112)
Sodium: 133 mEq/L — ABNORMAL LOW (ref 135–145)

## 2011-08-31 MED ORDER — SPIRONOLACTONE 25 MG PO TABS: 25.0000 mg | ORAL_TABLET | Freq: Every day | ORAL | Status: DC

## 2011-08-31 NOTE — Patient Instructions (Signed)
Keep working on the weight loss

## 2011-08-31 NOTE — Progress Notes (Signed)
  Subjective:    Patient ID: [name] [name], female    DOB: 1931-12-10, 78 y.o.   MRN: [id number]  HPI She was recently seen her cardiologist and placed on spironolactone. That record was reviewed. She is doing quite well today and has no particular concerns or complaints. She has lost weight. Her record was reviewed and she has lost over 10 pounds in the last year.   Review of Systems     Objective:   Physical Exam Alert and in no distress. Blood pressure is recorded.       Assessment & Plan:   1. Hypertension  spironolactone (ALDACTONE) 25 MG tablet, Electrolyte panel  2. Encounter for long-term (current) use of other medications  Electrolyte panel  3. Cardiomegaly     Encouraged her to continue with her weight loss program. Spironolactone was renewed.

## 2011-09-01 NOTE — Progress Notes (Signed)
Quick Note:  The blood work is normal ______ 

## 2011-11-20 ENCOUNTER — Telehealth: Payer: Self-pay | Admitting: Internal Medicine

## 2011-11-20 MED ORDER — TRIAMCINOLONE ACETONIDE 0.5 % EX CREA: TOPICAL_CREAM | Freq: Three times a day (TID) | CUTANEOUS | Status: DC

## 2011-11-20 NOTE — Telephone Encounter (Signed)
Triamcinolone renewed

## 2011-11-21 ENCOUNTER — Encounter: Payer: Self-pay | Admitting: Internal Medicine

## 2011-12-20 ENCOUNTER — Encounter: Payer: Self-pay | Admitting: Vascular Surgery

## 2011-12-21 ENCOUNTER — Encounter (INDEPENDENT_AMBULATORY_CARE_PROVIDER_SITE_OTHER): Payer: Medicare Other | Admitting: *Deleted

## 2011-12-21 ENCOUNTER — Ambulatory Visit (INDEPENDENT_AMBULATORY_CARE_PROVIDER_SITE_OTHER): Payer: Medicare Other | Admitting: Vascular Surgery

## 2011-12-21 ENCOUNTER — Encounter: Payer: Self-pay | Admitting: Vascular Surgery

## 2011-12-21 VITALS — BP 135/78 | HR 53 | Ht 66.0 in | Wt 195.0 lb

## 2011-12-21 DIAGNOSIS — I70219 Atherosclerosis of native arteries of extremities with intermittent claudication, unspecified extremity: Secondary | ICD-10-CM | POA: Insufficient documentation

## 2011-12-21 DIAGNOSIS — I739 Peripheral vascular disease, unspecified: Secondary | ICD-10-CM

## 2011-12-21 DIAGNOSIS — Z48812 Encounter for surgical aftercare following surgery on the circulatory system: Secondary | ICD-10-CM

## 2011-12-21 NOTE — Progress Notes (Signed)
[facility] [place] HISTORY AND PHYSICAL   History of Present Illness:  Patient is a 78 y.o. year old female who presents for evaluation of a nonhealing ulcer of her left fourth toe. The ulcer has been present for approximately one year. She does not complain of pain or claudication symptoms..  She underwent a right femoral anterior tibial artery bypass in 2004. Other medical problems include hypertension, hyperlipidemia, atrial fibrillation. These are currently stable.  Past Medical History  Diagnosis Date  . Arthritis   . Hypertension   . PVD (peripheral vascular disease)   . GERD (gastroesophageal reflux disease)   . Obesity   . Menopause   . Dyslipidemia   . Asthma   . Diverticulosis   . Hemorrhoids   . Allergy   . History of atrial fibrillation   . Cataract     Past Surgical History  Procedure Date  . Eye surgery     CATARACT (BILATERAL)  . Hernia repair     abdominal x 2  . Temporal artery biopsy / ligation   . Colonoscopy 02/2004  . Shoulder surgery     age 39 due to dislocation     Social History History  Substance Use Topics  . Smoking status: Never Smoker   . Smokeless tobacco: Never Used  . Alcohol Use: No    Family History Family History  Problem Relation Age of Onset  . Cancer Sister     died of breast cancer  . Mental retardation Paternal Aunt   . Heart disease Son     died age 66yo    Allergies  No Known Allergies   Current Outpatient Prescriptions  Medication Sig Dispense Refill  . Acetaminophen (TYLENOL ARTHRITIS EXT RELIEF PO) Take 2 tablets by mouth every evening.        [name] aspirin 81 MG tablet Take 81 mg by mouth daily.        [name] atenolol-chlorthalidone (TENORETIC) 100-25 MG per tablet Take 1 tablet by mouth daily.  90 tablet  3  . fish oil-omega-3 fatty acids 1000 MG capsule Take 1 g by mouth daily.        . Misc Natural Products (OSTEO BI-FLEX ADV JOINT SHIELD PO) Take 1 tablet by mouth daily.        . Multiple  Vitamin (MULITIVITAMIN WITH MINERALS) TABS Take 1 tablet by mouth daily.        [name] OVER THE COUNTER MEDICATION Take 1 tablet by mouth daily. Vitamin B-12      . OVER THE COUNTER MEDICATION Take 1 tablet by mouth daily. Vitamin D OTC       . simvastatin (ZOCOR) 80 MG tablet Take 0.5 tablets (40 mg total) by mouth daily.  90 tablet  3  . spironolactone (ALDACTONE) 25 MG tablet Take 1 tablet (25 mg total) by mouth daily.  90 tablet  3  . triamcinolone cream (KENALOG) 0.5 % Apply topically 3 (three) times daily.  60 g  3    ROS:   General:  No weight loss, Fever, chills  HEENT: No recent headaches, no nasal bleeding, no visual changes, no sore throat  Neurologic: No dizziness, blackouts, seizures. No recent symptoms of stroke or mini- stroke. No recent episodes of slurred speech, or temporary blindness.  Cardiac: No recent episodes of chest pain/pressure, no shortness of breath at rest.  No shortness of breath with exertion.  Denies history of atrial fibrillation or irregular heartbeat  Vascular: No history of rest  pain in feet.  No history of claudication.  No history of non-healing ulcer, No history of DVT   Pulmonary: No home oxygen, no productive cough, no hemoptysis,  No asthma or wheezing  Musculoskeletal:  [ ]  Arthritis, [ ]  Low back pain,  [ ]  Joint pain  Hematologic:No history of hypercoagulable state.  No history of easy bleeding.  No history of anemia  Gastrointestinal: No hematochezia or melena,  No gastroesophageal reflux, no trouble swallowing  Urinary: [ ]  chronic Kidney disease, [ ]  on HD - [ ]  MWF or [ ]  TTHS, [ ]  Burning with urination, [ ]  Frequent urination, [ ]  Difficulty urinating;   Skin: No rashes  Psychological: No history of anxiety,  No history of depression   Physical Examination  There were no vitals filed for this visit.  There is no height or weight on file to calculate BMI.  General:  Alert and oriented, no acute distress HEENT: Normal Neck: No  bruit or JVD Pulmonary: Clear to auscultation bilaterally Cardiac: Regular Rate and Rhythm without murmur Abdomen: Soft, non-tender, non-distended, no mass Skin: No rash, 2 cm ulceration dorsal aspect of left fourth toe no obvious drainage darkish discoloration of entire toe Extremity Pulses:  2+ radial, brachial, femoral, absent dorsalis pedis, posterior tibial pulses bilaterally Musculoskeletal: No deformity or edema  Neurologic: Upper and lower extremity motor 5/5 and symmetric  DATA: Patient had a graft duplex scan of the right leg as well as bilateral ABIs today. I reviewed and interpreted this study. The bypass graft on the right side is patent with an ABI of 0.97. The ABI on the left was 0.55 with a toe pressure of 84.   ASSESSMENT:   Patent bypass right lower extremity with no significant stenosis. Chronic nonhealing ulcer left foot with decreased ABIs.   PLAN:  I believe the patient needs an arteriogram and lower assuring a runoff possible intervention to improve perfusion to her left foot to improve wound healing potential. We have scheduled her arteriogram for 01/12/2012. Risks benefits possible complications and procedure details were discussed with the patient and her husband today. She understands and agrees to proceed.  Fabienne Bruns, MD [facility] [place] Office: [phone] Pager: [phone]

## 2012-01-03 ENCOUNTER — Telehealth: Payer: Self-pay | Admitting: Family Medicine

## 2012-01-03 MED ORDER — TRIAMCINOLONE ACETONIDE 0.5 % EX CREA: TOPICAL_CREAM | Freq: Three times a day (TID) | CUTANEOUS | Status: DC

## 2012-01-03 NOTE — Telephone Encounter (Signed)
Med renewed

## 2012-01-11 ENCOUNTER — Other Ambulatory Visit: Payer: Self-pay | Admitting: *Deleted

## 2012-01-11 ENCOUNTER — Encounter: Payer: Self-pay | Admitting: *Deleted

## 2012-01-29 ENCOUNTER — Encounter (HOSPITAL_COMMUNITY): Payer: Self-pay | Admitting: Pharmacy Technician

## 2012-02-08 MED ORDER — SODIUM CHLORIDE 0.9 % IV SOLN
INTRAVENOUS | Status: DC
  Administered 2012-02-09: 07:00:00 via INTRAVENOUS

## 2012-02-09 ENCOUNTER — Encounter (HOSPITAL_COMMUNITY)
Admission: RE | Disposition: A | Discharge status: Home / Self Care | Payer: Self-pay | Source: Ambulatory Visit | Attending: Vascular Surgery

## 2012-02-09 ENCOUNTER — Ambulatory Visit (HOSPITAL_COMMUNITY)
Admission: RE | Admit: 2012-02-09 | Discharge: 2012-02-09 | Disposition: A | Discharge status: Home / Self Care | Payer: Medicare Other | Source: Ambulatory Visit | Attending: Vascular Surgery | Admitting: Vascular Surgery

## 2012-02-09 DIAGNOSIS — I739 Peripheral vascular disease, unspecified: Secondary | ICD-10-CM

## 2012-02-09 DIAGNOSIS — L98499 Non-pressure chronic ulcer of skin of other sites with unspecified severity: Secondary | ICD-10-CM | POA: Insufficient documentation

## 2012-02-09 DIAGNOSIS — K219 Gastro-esophageal reflux disease without esophagitis: Secondary | ICD-10-CM | POA: Insufficient documentation

## 2012-02-09 DIAGNOSIS — I10 Essential (primary) hypertension: Secondary | ICD-10-CM | POA: Insufficient documentation

## 2012-02-09 DIAGNOSIS — E669 Obesity, unspecified: Secondary | ICD-10-CM | POA: Insufficient documentation

## 2012-02-09 DIAGNOSIS — J45909 Unspecified asthma, uncomplicated: Secondary | ICD-10-CM | POA: Insufficient documentation

## 2012-02-09 HISTORY — PX: ABDOMINAL AORTAGRAM: SHX5454

## 2012-02-09 LAB — POCT I-STAT, CHEM 8
Calcium, Ion: 1.29 mmol/L (ref 1.13–1.30)
Chloride: 97 mEq/L (ref 96–112)
Glucose, Bld: 95 mg/dL (ref 70–99)
HCT: 41 % (ref 36.0–46.0)
Hemoglobin: 13.9 g/dL (ref 12.0–15.0)
TCO2: 24 mmol/L (ref 0–100)

## 2012-02-09 SURGERY — ABDOMINAL AORTAGRAM
Anesthesia: LOCAL

## 2012-02-09 MED ORDER — LIDOCAINE HCL (PF) 1 % IJ SOLN
INTRAMUSCULAR | Status: AC
  Filled 2012-02-09: qty 30

## 2012-02-09 MED ORDER — HYDRALAZINE HCL 20 MG/ML IJ SOLN: 10.0000 mg | INTRAMUSCULAR | Status: DC | PRN

## 2012-02-09 MED ORDER — SODIUM CHLORIDE 0.45 % IV SOLN: INTRAVENOUS | Status: DC

## 2012-02-09 MED ORDER — GUAIFENESIN-DM 100-10 MG/5ML PO SYRP: 15.0000 mL | ORAL_SOLUTION | ORAL | Status: DC | PRN

## 2012-02-09 MED ORDER — ACETAMINOPHEN 325 MG PO TABS: 325.0000 mg | ORAL_TABLET | ORAL | Status: DC | PRN

## 2012-02-09 MED ORDER — METOPROLOL TARTRATE 1 MG/ML IV SOLN: 2.0000 mg | INTRAVENOUS | Status: DC | PRN

## 2012-02-09 MED ORDER — PHENOL 1.4 % MT LIQD: 1.0000 | OROMUCOSAL | Status: DC | PRN

## 2012-02-09 MED ORDER — DOCUSATE SODIUM 100 MG PO CAPS: 100.0000 mg | ORAL_CAPSULE | Freq: Every day | ORAL | Status: DC

## 2012-02-09 MED ORDER — ACETAMINOPHEN 325 MG RE SUPP: 325.0000 mg | RECTAL | Status: DC | PRN

## 2012-02-09 MED ORDER — HEPARIN (PORCINE) IN NACL 2-0.9 UNIT/ML-% IJ SOLN
INTRAMUSCULAR | Status: AC
  Filled 2012-02-09: qty 1000

## 2012-02-09 MED ORDER — ONDANSETRON HCL 4 MG/2ML IJ SOLN: 4.0000 mg | Freq: Four times a day (QID) | INTRAMUSCULAR | Status: DC | PRN

## 2012-02-09 MED ORDER — LABETALOL HCL 5 MG/ML IV SOLN: 10.0000 mg | INTRAVENOUS | Status: DC | PRN

## 2012-02-09 MED ORDER — MORPHINE SULFATE 10 MG/ML IJ SOLN: 2.0000 mg | INTRAMUSCULAR | Status: DC | PRN

## 2012-02-09 NOTE — Op Note (Signed)
Procedure: Aortogram with bilateral lower extremity runoff  Preoperative diagnosis: Non healing wound left foot  Postoperative diagnosis: Same  Anesthesia Local  Operative details: After obtaining informed consent, the patient was taken to the [facility]. The patient was placed in supine position on the Angio table. Both groins were prepped and draped in usual sterile fashion. Local anesthesia was infiltrated over the left common femoral artery.  An introducer needle was used to cannulate the left common femoral artery using ultrasound guidance and 035 versacore wire threaded into the abdominal aorta under fluoroscopic guidance. Next a 5 French sheath is placed over the guidewire in the left common femoral artery. A 5 French pigtail catheter was placed over the guidewire into the abdominal aorta and abdominal aortogram was obtained. The infrarenal abdominal aorta is patent. The left and right common internal and external iliac arteries are patent.   Next a bilateral lower extremity runoff was performed through the pigtail catheter.  In the left lower extremity, the common femoral and profunda femoris is patent.  The native left SFA is patent.  The above knee popliteal artery is patent.  The below knee popliteal artery is occluded.  There is one vessel runoff in what appears to be the peroneal artery.  The pigtail catheter was removed over a guidewire and an angiogram obtained via the sheath. The 5 French sheath was then connected to obtain additional views of the left lower extremity runoff which confirms the above findings.  This is better clarified on the lateral foot view with contrast injected through the sheath.     In the right lower extremity, the common femoral and profunda femoris arteries are patent.  The right SFA is diffusely diseased with several areas of subtotal occlusion. There is a bypass from the common femoral to the anterior tibial artery which is patent.  The midsegment is not visualized  due to lateral course of the graft.   The posterior tibial artery is occluded. The peroneal artery is occluded.  The anterior tibial artery is patent which supplies the foot.      The 5Fr sheath was left in place to be pulled in the holding area. The patient tolerated the procedure well and there were no complications. Patient was taken to the holding area in stable condition.  Operative findings: Left leg- patent CFA, SFA, profunda.  Occluded popliteal below the knee.  Severe tibial disease with small vessels. Runoff as above patent peroneal.  Right leg- patent anterior tibial artery bypass.     Operative management: Since the ulcer is currently healing will reevaluate in 6 weeks if backslides or worse popliteal to pernoneal bypass.  Fabienne Bruns, MD [facility] [place] Office: [phone] Pager: [phone]

## 2012-02-09 NOTE — H&P (Signed)
[facility] [place] HISTORY AND PHYSICAL     History of Present Illness:  Patient is a 78 y.o. year old female who presents for evaluation of a nonhealing ulcer of her left fourth toe. The ulcer has been present for approximately one year. She does not complain of pain or claudication symptoms..  She underwent a right femoral anterior tibial artery bypass in 2004. Other medical problems include hypertension, hyperlipidemia, atrial fibrillation. These are currently stable.    Past Medical History   Diagnosis  Date   .  Arthritis     .  Hypertension     .  PVD (peripheral vascular disease)     .  GERD (gastroesophageal reflux disease)     .  Obesity     .  Menopause     .  Dyslipidemia     .  Asthma     .  Diverticulosis     .  Hemorrhoids     .  Allergy     .  History of atrial fibrillation     .  Cataract           Past Surgical History   Procedure  Date   .  Eye surgery         CATARACT (BILATERAL)   .  Hernia repair         abdominal x 2   .  Temporal artery biopsy / ligation     .  Colonoscopy  02/2004   .  Shoulder surgery         age 52 due to dislocation          Social History History   Substance Use Topics   .  Smoking status:  Never Smoker    .  Smokeless tobacco:  Never Used   .  Alcohol Use:  No        Family History Family History   Problem  Relation  Age of Onset   .  Cancer  Sister         died of breast cancer   .  Mental retardation  Paternal Aunt     .  Heart disease  Son         died age 43yo        Allergies   No Known Allergies      Current Outpatient Prescriptions   Medication  Sig  Dispense  Refill   .  Acetaminophen (TYLENOL ARTHRITIS EXT RELIEF PO)  Take 2 tablets by mouth every evening.           [name]  aspirin 81 MG tablet  Take 81 mg by mouth daily.           [name]  atenolol-chlorthalidone (TENORETIC) 100-25 MG per tablet  Take 1 tablet by mouth daily.   90 tablet   3   .  fish oil-omega-3 fatty  acids 1000 MG capsule  Take 1 g by mouth daily.           .  Misc Natural Products (OSTEO BI-FLEX ADV JOINT SHIELD PO)  Take 1 tablet by mouth daily.           .  Multiple Vitamin (MULITIVITAMIN WITH MINERALS) TABS  Take 1 tablet by mouth daily.           [name]  OVER THE COUNTER MEDICATION  Take 1 tablet by mouth daily. Vitamin B-12         .  OVER THE COUNTER MEDICATION  Take 1 tablet by mouth daily. Vitamin D OTC          .  simvastatin (ZOCOR) 80 MG tablet  Take 0.5 tablets (40 mg total) by mouth daily.   90 tablet   3   .  spironolactone (ALDACTONE) 25 MG tablet  Take 1 tablet (25 mg total) by mouth daily.   90 tablet   3   .  triamcinolone cream (KENALOG) 0.5 %  Apply topically 3 (three) times daily.   60 g   3        ROS:     General:  No weight loss, Fever, chills   HEENT: No recent headaches, no nasal bleeding, no visual changes, no sore throat   Neurologic: No dizziness, blackouts, seizures. No recent symptoms of stroke or mini- stroke. No recent episodes of slurred speech, or temporary blindness.   Cardiac: No recent episodes of chest pain/pressure, no shortness of breath at rest.  No shortness of breath with exertion.  Denies history of atrial fibrillation or irregular heartbeat   Vascular: No history of rest pain in feet.  No history of claudication.  No history of non-healing ulcer, No history of DVT     Pulmonary: No home oxygen, no productive cough, no hemoptysis,  No asthma or wheezing   Musculoskeletal:  [ ]  Arthritis, [ ]  Low back pain,  [ ]  Joint pain   Hematologic:No history of hypercoagulable state.  No history of easy bleeding.  No history of anemia   Gastrointestinal: No hematochezia or melena,  No gastroesophageal reflux, no trouble swallowing   Urinary: [ ]  chronic Kidney disease, [ ]  on HD - [ ]  MWF or [ ]  TTHS, [ ]  Burning with urination, [ ]  Frequent urination, [ ]  Difficulty urinating;     Skin: No rashes   Psychological: No history of  anxiety,  No history of depression     Physical Examination Filed Vitals:   02/09/12 0705 02/09/12 0708  BP: 150/68   Pulse: 53   Temp: 97.3 F (36.3 C)   TempSrc: Oral   Resp: 18   Height:  5' 2.5" (1.588 m)  Weight:  187 lb (84.823 kg)  SpO2: 100%    General:  Alert and oriented, no acute distress HEENT: Normal Neck: No bruit or JVD Pulmonary: Clear to auscultation bilaterally Cardiac: Regular Rate and Rhythm without murmur Abdomen: Soft, non-tender, non-distended, no mass Skin: No rash, 2 cm ulceration dorsal aspect of left fourth toe no obvious drainage darkish discoloration of entire toe Extremity Pulses:  2+ radial, brachial, femoral, absent dorsalis pedis, posterior tibial pulses bilaterally Musculoskeletal: No deformity or edema     Neurologic: Upper and lower extremity motor 5/5 and symmetric   DATA: Patient had a graft duplex scan of the right leg as well as bilateral ABIs today. I reviewed and interpreted this study. The bypass graft on the right side is patent with an ABI of 0.97. The ABI on the left was 0.55 with a toe pressure of 84.     ASSESSMENT:   Patent bypass right lower extremity with no significant stenosis. Chronic nonhealing ulcer left foot with decreased ABIs.     PLAN:  I believe the patient needs an arteriogram and lower assuring a runoff possible intervention to improve perfusion to her left foot to improve wound healing potential. We have scheduled her arteriogram for 01/12/2012. Risks benefits possible complications and procedure details were discussed with the patient and  her husband today. She understands and agrees to proceed.   Addendum: left fourth toe is dry and slowly healing will do arteriogram for diagnostic purposes and watchful waiting of wound for now  Fabienne Bruns, MD [facility] [place] Office: [phone] Pager: [phone]

## 2012-03-20 ENCOUNTER — Encounter: Payer: Self-pay | Admitting: Vascular Surgery

## 2012-03-21 ENCOUNTER — Ambulatory Visit (INDEPENDENT_AMBULATORY_CARE_PROVIDER_SITE_OTHER): Payer: Medicare Other | Admitting: Vascular Surgery

## 2012-03-21 ENCOUNTER — Encounter: Payer: Self-pay | Admitting: Vascular Surgery

## 2012-03-21 VITALS — BP 136/65 | HR 79 | Resp 20 | Ht 62.5 in | Wt 189.0 lb

## 2012-03-21 DIAGNOSIS — I70219 Atherosclerosis of native arteries of extremities with intermittent claudication, unspecified extremity: Secondary | ICD-10-CM | POA: Insufficient documentation

## 2012-03-21 DIAGNOSIS — I739 Peripheral vascular disease, unspecified: Secondary | ICD-10-CM

## 2012-03-21 DIAGNOSIS — Z48812 Encounter for surgical aftercare following surgery on the circulatory system: Secondary | ICD-10-CM

## 2012-03-21 NOTE — Progress Notes (Signed)
Patient is a 79 year old female who returns today for followup after recent arteriogram for an ulcer on her left foot.  She was noted to have a popliteal artery occlusion. However she has spontaneously heal the ulcer on her left foot. She denies rest pain. She has also previous had a right femoral to anterior tibial artery bypass. This is patent. Overall she is doing well.  Review of systems: She denies shortness of breath. She denies chest pain.  Physical exam: Filed Vitals:   03/21/12 1302  BP: 136/65  Pulse: 79  Resp: 20  Height: 5' 2.5" (1.588 m)  Weight: 189 lb (85.73 kg)   Right lower extremity: Palpable graft pulse lateral knee right foot pink warm  Left lower extremity: Ulcer left third toe completely healed no palpable pulses  Data: Patient had a graft duplex scan today as well as ABIs. ABI on the left was 0.65 right was 0.97 no significant stenosis in her anterior tibial artery bypass graft  Assessment: Patent anterior tibial artery bypass graft right leg healed wound left foot Plan: Followup in one year with graft duplex ABIs followup sooner she has new wound on the foot  Fabienne Bruns, MD [facility] [place] Office: [phone] Pager: [phone]

## 2012-03-22 NOTE — Addendum Note (Signed)
Addended by: Sharee Pimple on: 03/22/2012 08:19 AM   Modules accepted: Orders

## 2012-04-22 ENCOUNTER — Encounter: Payer: Self-pay | Admitting: Cardiology

## 2012-04-22 NOTE — Progress Notes (Signed)
Patient ID: [name] [name], female   DOB: 03/28/1931, 79 y.o.   MRN: [id number]  [name], [name]    Date of visit:  04/22/2012 DOB:  1931/04/24    Age:  79 yrs. Medical record number:  [id number]     Account number:  [id number] Primary Care Provider: Ernst Breach ____________________________ CURRENT DIAGNOSES  1. Peripheral Vascular Disease  2. Arrhythmia-Atrial Fibrillation  3. Cardiomegaly  4. Hypertensive Heart Disease-Benign without CHF  5. Hyperlipidemia  6. Obesity(BMI30-40)  7. GERD  8. Polymyalgia Rheumatica  9. Conduction Disorder-Left Bundle Branch Block  10. Giant Cell Arteritis ____________________________ ALLERGIES  Penicillin - Natural (i.e. Pen G, Pen V) ____________________________ MEDICATIONS  1. Tylenol 325 mg tablet, 2 qd +PRN  2. aspirin 81 mg tablet, chewable, 1 p.o. daily  3. atenolol-chlorthalidone 100-25 mg tablet, 1 p.o. daily  4. Fish Oil 1,000 mg capsule, 1 p.o. daily  5. Osteo Bi-Flex 250-200 mg tablet, 1 p.o. daily  6. multivitamin tablet, 1 p.o. daily  7. Vitamin B-12 500 mcg tablet, 1 p.o. daily  8. Vitamin D3 1,000 unit tablet, 1 p.o. daily  9. simvastatin 80 mg tablet, 1/2 tab daily  10. Vitamin C 500 mg tablet, 1 p.o. daily  11. Aleve 220 mg tablet, PRN  12. magnesium 200 mg tablet, 400mg  qd  13. Calcium 600 + D(3) 600 mg(1,500mg ) -400 unit tablet, 1 p.o. daily  14. spironolactone 25 mg tablet, 1 p.o. daily ____________________________ CHIEF COMPLAINTS  Followup of Arrhythmia-Atrial Fibrillation ____________________________ HISTORY OF PRESENT ILLNESS  Patient seen for cardiac followup. Since she was previously here she had a nonhealing ulcer on her left foot. She eventually had an arteriogram that showed occlusion of the popliteal artery on the left done by Dr. he is. She however had spontaneous healing of her foot and Dr. Darrick Penna felt that he would treat her conservatively unless she had recurrent problems at which point in time she would be in  a candidate for an operation. She is feeling relatively well. She denies dyspnea or palpitations. She has no PND, orthopnea or edema. She is able to do most of her normal activities and knows her medications well. ____________________________ PAST HISTORY  Past Medical Illnesses:  hypertension, hyperlipidemia, peripheral vascular disease, osteoarthritis, GERD, asthma, history of polymylagia rheumatica, obesity;  Cardiovascular Illnesses:  atrial fibrillation, peripheral vascular disease;  Surgical Procedures:  cataract extraction OU, right shoulder surgery, umbilical hernia, ventral hernia repair, temporal artery biopsy, Fem-tib bypass 9/04 Dr. Darrick Penna, D and C;  NYHA Classification:  II;  Cardiology Procedures-Invasive:  cardiac cath (left) 2004;  Cardiology Procedures-Noninvasive:  echocardiogram, echocardiogram May 2013, lexiscan cardiolite May 2013, event monitor May 2013;  Cardiac Cath Results:  no significant disease;  Peripheral Vascular Procedures:  carotid doppler May 2013   (mild plaque bilat, RECA of 1-40%);  LVEF of 45% documented via echocardiogram on 08/04/2011 ____________________________ CARDIO-PULMONARY TEST DATES EKG Date:  07/14/2011;   Cardiac Cath Date:  11/06/2002;  Holter/Event Monitor Date: 07/20/2011;  Nuclear Study Date:  07/20/2011;  Echocardiography Date: 07/19/2011;  Chest Xray Date: 02/07/2011;   ____________________________ SOCIAL HISTORY Alcohol Use:  no alcohol use;  Smoking:  never smoked;  Diet:  regular diet;  Lifestyle:  married x 29 years;  Exercise:  water aerobics;  Occupation:  Social worker and food service;  Residence:  lives with husband;   ____________________________ REVIEW OF SYSTEMS General:  obesity  Eyes:  denies diplopia, history of glaucoma or visual problems. Respiratory:  mild dyspnea with exertion  Cardiovascular:  please review HPI Abdominal:  denies dyspepsia, GI bleeding, constipation, or diarrhea Genitourinary-Female:  frequency, nocturia   Musculoskeletal:  arthritis of the knees ____________________________ PHYSICAL EXAMINATION VITAL SIGNS  Blood Pressure:  146/72 Sitting, Right arm, large cuff  , 158/70 Standing, Right arm and large cuff   Pulse:  76/min. Weight:  198.00 lbs. Height:  62"BMI: 36  Constitutional:  pleasant African American female in no acute distress, severely obese walks with cane Skin:  warm and dry to touch, no apparent skin lesions, or masses noted. Head:  normocephalic, normal hair pattern, no masses or tenderness ENT:  ears, nose and throat reveal no gross abnormalities.  Dentition good. Neck:  supple, without massess. No JVD, thyromegaly or carotid bruits. Carotid upstroke normal., prominent carotid impulse on the right Chest:  normal symmetry, clear to auscultation and percussion. Cardiac:  regular rhythm, normal S1 and S2, No S3 or S4, no murmurs, gallops or rubs detected. Peripheral Pulses:  left femoral bruit, reduced pedal pulses on the left, 1-2+ on the right Extremities & Back:  no deformities, clubbing, cyanosis, erythema or edema observed. Normal muscle strength and tone. Neurological:  no gross motor or sensory deficits noted, affect appropriate, oriented x3. ____________________________ MOST RECENT LIPID PANEL 06/29/11  CHOL TOTL 104 mg/dl, LDL 40 calc, HDL 48 mg/dl, TRIGLYCER 79 mg/dl and CHOL/HDL 2.2 (Calc) ____________________________ IMPRESSIONS/PLAN  1. Peripheral vascular disease 2. Hypertensive heart disease 3. History of atrial fibrillation no recurrence recently on event monitoring 4. Obesity with need to lose weight.  Recommendations:  Clinically doing well and does have performed vascular disease. Needs to modify risk factors additionally. Recommended followup in 6 months and call if problems. ____________________________ TODAYS ORDERS  1. Return Visit: 6 months  2. 12 Lead EKG: 6 months                       ____________________________ Cardiology Physician:  Darden Palmer MD [facility]

## 2012-04-30 ENCOUNTER — Ambulatory Visit (INDEPENDENT_AMBULATORY_CARE_PROVIDER_SITE_OTHER): Payer: Medicare Other | Admitting: Family Medicine

## 2012-04-30 DIAGNOSIS — I739 Peripheral vascular disease, unspecified: Secondary | ICD-10-CM

## 2012-04-30 DIAGNOSIS — I10 Essential (primary) hypertension: Secondary | ICD-10-CM

## 2012-04-30 DIAGNOSIS — I517 Cardiomegaly: Secondary | ICD-10-CM

## 2012-04-30 MED ORDER — SPIRONOLACTONE 25 MG PO TABS: 25.0000 mg | ORAL_TABLET | Freq: Every day | ORAL | Status: DC

## 2012-04-30 NOTE — Progress Notes (Signed)
  Subjective:    Patient ID: [name] [name], female    DOB: Jan 01, 1932, 79 y.o.   MRN: [id number]  HPI She is here for a followup visit. She was recently seen by Dr. Donnie Aho. That note was reviewed. In the notes there is mention of polymyalgia rheumatica as well as giant cell arteritis however I have no record of that. At this time she is having no particular concerns or complaints.   Review of Systems     Objective:   Physical Exam Alert and in no distress. Cardiac exam shows regular rhythm without murmurs or gallops. Lungs clear to auscultation.       Assessment & Plan:  Hypertension - Plan: spironolactone (ALDACTONE) 25 MG tablet  Cardiomegaly  PVD (peripheral vascular disease) encouraged her to continue on her present medication regimen. Return here in roughly 6 months.

## 2012-05-16 ENCOUNTER — Telehealth: Payer: Self-pay | Admitting: Internal Medicine

## 2012-05-16 ENCOUNTER — Telehealth: Payer: Self-pay | Admitting: Family Medicine

## 2012-05-16 MED ORDER — FLUCONAZOLE 150 MG PO TABS: 150.0000 mg | ORAL_TABLET | Freq: Once | ORAL | Status: DC

## 2012-05-16 NOTE — Telephone Encounter (Signed)
Pt called and stated that when she was hospital for surgery. They placed her on a antibiotic. She states that now she is having itching and burning in her vagina. She is requesting something be called in for her. She states that in the past you have given her 3 or 4 pills that work really well. Pt uses [facility].

## 2012-05-16 NOTE — Telephone Encounter (Signed)
[facility] pharmacy called and med was not sent in and pt was need med. Called in diflucan 150 as prescribed to [facility] and faxed over stating to cancel the rx at [facility]  @[phone]

## 2012-05-16 NOTE — Telephone Encounter (Signed)
Let her know to take one of the pills and she may repeat that in 2 or 3 days if she needs to

## 2012-05-16 NOTE — Telephone Encounter (Signed)
Patient is on an antibiotic and having difficulty with vaginal irritation. I will give her Diflucan.

## 2012-05-16 NOTE — Telephone Encounter (Signed)
Pt notified of new med and how to take it pt verbalized understanding

## 2012-05-20 ENCOUNTER — Other Ambulatory Visit: Payer: Self-pay | Admitting: Family Medicine

## 2012-05-20 ENCOUNTER — Other Ambulatory Visit: Payer: Self-pay | Admitting: Medical

## 2012-07-25 ENCOUNTER — Telehealth: Payer: Self-pay | Admitting: Internal Medicine

## 2012-07-25 DIAGNOSIS — I10 Essential (primary) hypertension: Secondary | ICD-10-CM

## 2012-07-25 MED ORDER — SPIRONOLACTONE 25 MG PO TABS: 25.0000 mg | ORAL_TABLET | Freq: Every day | ORAL | Status: DC

## 2012-07-25 MED ORDER — SIMVASTATIN 80 MG PO TABS: 40.0000 mg | ORAL_TABLET | Freq: Every day | ORAL | Status: DC

## 2012-07-25 MED ORDER — TRIAMCINOLONE ACETONIDE 0.5 % EX CREA: TOPICAL_CREAM | CUTANEOUS | Status: DC

## 2012-07-25 MED ORDER — ATENOLOL-CHLORTHALIDONE 100-25 MG PO TABS: ORAL_TABLET | ORAL | Status: DC

## 2012-07-25 NOTE — Telephone Encounter (Signed)
SENT MEDS IN  

## 2012-07-25 NOTE — Telephone Encounter (Signed)
Refill request for atenolol/chlorthaldone 100-25mg , simvastatin, spironolactone 25mg  and triamcinolone cream 0.5% to [facility]

## 2012-07-26 MED ORDER — TRIAMCINOLONE ACETONIDE 0.5 % EX CREA: TOPICAL_CREAM | CUTANEOUS | Status: DC

## 2012-07-26 MED ORDER — SIMVASTATIN 80 MG PO TABS: 40.0000 mg | ORAL_TABLET | Freq: Every day | ORAL | Status: DC

## 2012-07-26 MED ORDER — SPIRONOLACTONE 25 MG PO TABS: 25.0000 mg | ORAL_TABLET | Freq: Every day | ORAL | Status: DC

## 2012-07-26 NOTE — Addendum Note (Signed)
Addended by: Ronnald Nian on: 07/26/2012 09:55 AM   Modules accepted: Orders

## 2012-08-20 ENCOUNTER — Ambulatory Visit (INDEPENDENT_AMBULATORY_CARE_PROVIDER_SITE_OTHER): Payer: Medicare Other | Admitting: Family Medicine

## 2012-08-20 ENCOUNTER — Encounter: Payer: Self-pay | Admitting: Family Medicine

## 2012-08-20 VITALS — BP 124/80 | HR 60 | Wt 193.0 lb

## 2012-08-20 DIAGNOSIS — G479 Sleep disorder, unspecified: Secondary | ICD-10-CM

## 2012-08-20 DIAGNOSIS — R0602 Shortness of breath: Secondary | ICD-10-CM
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
## 2012-11-08 ENCOUNTER — Encounter: Payer: Self-pay | Admitting: Cardiology

## 2012-11-08 NOTE — Progress Notes (Signed)
Patient ID: [name] [name], female   DOB: [birth year], 79 y.o.   MRN: [id number]  [name], [name]    Date of visit:  11/08/2012 DOB:  [birth year]    Age:  79 yrs. Medical record number:  [id number] Primary Care Provider: [name] C ____________________________ CURRENT DIAGNOSES  1. Peripheral Vascular Disease  2. Arrhythmia-Atrial Fibrillation  3. Cardiomegaly  4. Hypertensive Heart Disease-Benign without CHF  5. Hyperlipidemia  6. Obesity(BMI30-40)  7. GERD  8. Polymyalgia Rheumatica  9. Conduction Disorder-Left Bundle Branch Block  10. Giant Cell Arteritis ____________________________ ALLERGIES  Penicillins, Intolerance-unknown ____________________________ MEDICATIONS  1. Tylenol 325 mg tablet, 2 qd +PRN  2. aspirin 81 mg tablet, chewable, 1 p.o. daily  3. atenolol-chlorthalidone 100-25 mg tablet, 1 p.o. daily  4. Fish Oil 1,000 mg capsule, 1 p.o. daily  5. Osteo Bi-Flex 250-200 mg tablet, 1 p.o. daily  6. multivitamin tablet, 1 p.o. daily  7. Vitamin D3 1,000 unit tablet, 1 p.o. daily  8. simvastatin 80 mg tablet, 1/2 tab daily  9. Vitamin C 500 mg tablet, 1 p.o. daily  10. Aleve 220 mg tablet, PRN  11. Calcium 600 + D(3) 600 mg(1,500mg ) -400 unit tablet, 1 p.o. daily  12. spironolactone 25 mg tablet, 1 p.o. daily ____________________________ CHIEF COMPLAINTS  Followup of PVD and BP ____________________________ HISTORY OF PRESENT ILLNESS  Patient returns for cardiac followup. She has lost 10 pounds of weight since she was here and a total of 20 pounds over the past year. She currently feels much better. She is having less knee pain and overall her sense of well being and energy is much improved. Blood pressure is controlled. She has had no known recurrence of atrial fibrillation. She has had no recurrence of ulcers on her feet and is not currently having PND, orthopnea or edema. She has no dyspnea, palpitations, or claudication. ____________________________ PAST HISTORY   Past Medical Illnesses:  hypertension, hyperlipidemia, peripheral vascular disease, osteoarthritis, GERD, asthma, history of polymylagia rheumatica, obesity;  Cardiovascular Illnesses:  atrial fibrillation, peripheral vascular disease;  Surgical Procedures:  cataract extraction OU, right shoulder surgery, umbilical hernia, ventral hernia repair, temporal artery biopsy, Fem-tib bypass 9/04 Dr. Darrick Penna, D and C;  NYHA Classification:  II;  Cardiology Procedures-Invasive:  cardiac cath (left) 2004;  Cardiology Procedures-Noninvasive:  echocardiogram, echocardiogram May 2013, lexiscan cardiolite May 2013, event monitor May 2013;  Cardiac Cath Results:  no significant disease;  Peripheral Vascular Procedures:  carotid doppler May 2013   (mild plaque bilat, RECA of 1-40%);  LVEF of 45% documented via echocardiogram on 08/04/2011,   ____________________________ CARDIO-PULMONARY TEST DATES EKG Date:  11/08/2012;   Cardiac Cath Date:  11/06/2002;  Holter/Event Monitor Date: 07/20/2011;  Nuclear Study Date:  07/20/2011;  Echocardiography Date: 07/19/2011;  Chest Xray Date: 02/07/2011;   ____________________________ SOCIAL HISTORY Alcohol Use:  no alcohol use;  Smoking:  never smoked;  Diet:  regular diet;  Lifestyle:  married x 29 years;  Exercise:  water aerobics;  Occupation:  Social worker and food service;  Residence:  lives with husband;   ____________________________ REVIEW OF SYSTEMS General:  obesity, weight loss of approximately 10 lbs Eyes: denies diplopia, history of glaucoma or visual problems. Respiratory: mild dyspnea with exertion Cardiovascular:  please review HPI Abdominal: denies dyspepsia, GI bleeding, constipation, or diarrheaGenitourinary-Female: frequency, nocturia Musculoskeletal:  arthritis of the knees  ____________________________ PHYSICAL EXAMINATION VITAL SIGNS  Blood Pressure:  134/70 Sitting, Left arm, large cuff   Pulse:  80/min. Weight:  190.00 lbs. Height:  62"BMI:  34  Constitutional:  pleasant African American female in no acute distress, moderately obese walks with cane Head:  normocephalic, normal hair pattern, no masses or tenderness ENT:  ears, nose and throat unremarkable, full mouth dentures present Neck:  supple, without massess. No JVD, thyromegaly or carotid bruits. Carotid upstroke normal., prominent carotid impulse on the right Chest:  normal symmetry, clear to auscultation Cardiac:  regular rhythm, normal S1 and S2, No S3 or S4, no murmurs, gallops or rubs detected. Peripheral Pulses:  left femoral bruit, reduced pedal pulses on the left, 1-2+ on the right Extremities & Back:  no deformities, clubbing, cyanosis, erythema or edema observed. Normal muscle strength and tone. Neurological:  no gross motor or sensory deficits noted, affect appropriate, oriented x3. ____________________________ MOST RECENT LIPID PANEL 06/29/11  CHOL TOTL 104 mg/dl, LDL 40 calc, HDL 48 mg/dl, TRIGLYCER 79 mg/dl and CHOL/HDL 2.2 (Calc) ____________________________ IMPRESSIONS/PLAN  1. Peripheral vascular disease 2. Hypertension currently controlled 3. Obesity but with weight loss since last here 4. Hyperlipidemia under treatment  Recommendations:  Clinically her overall sense of well-being is doing well and she is completely stable now from a cardiac viewpoint. Her prior studies were reviewed and her blood pressure is currently controlled. I encouraged her in her weight loss and we'll see her back in followup in one year. ____________________________ TODAYS ORDERS  1. 12 Lead EKG: Today  2. Return Visit: 1 year                       ____________________________ Cardiology Physician:  Darden Palmer MD [facility]

## 2013-02-12 ENCOUNTER — Telehealth: Payer: Self-pay | Admitting: Family Medicine

## 2013-02-12 NOTE — Telephone Encounter (Signed)
Pt dropped off parking placard to be filled out. Pt states she would like it mailed to her when complete. I am sending back in folder.

## 2013-02-13 NOTE — Telephone Encounter (Signed)
Have her come in for a followup appointment

## 2013-02-13 NOTE — Telephone Encounter (Signed)
PT HAS APPOINTMENT 

## 2013-02-25 ENCOUNTER — Encounter: Payer: Self-pay | Admitting: Family Medicine

## 2013-02-25 ENCOUNTER — Ambulatory Visit (INDEPENDENT_AMBULATORY_CARE_PROVIDER_SITE_OTHER): Payer: Medicare Other | Admitting: Family Medicine

## 2013-02-25 ENCOUNTER — Ambulatory Visit: Payer: Self-pay | Admitting: Family Medicine

## 2013-02-25 VITALS — BP 144/80 | HR 61 | Wt 192.0 lb

## 2013-02-25 DIAGNOSIS — E785 Hyperlipidemia, unspecified: Secondary | ICD-10-CM

## 2013-02-25 DIAGNOSIS — I517 Cardiomegaly: Secondary | ICD-10-CM

## 2013-02-25 DIAGNOSIS — Z79899 Other long term (current) drug therapy: Secondary | ICD-10-CM

## 2013-02-25 DIAGNOSIS — Z23 Encounter for immunization: Secondary | ICD-10-CM

## 2013-02-25 DIAGNOSIS — R5383 Other fatigue: Secondary | ICD-10-CM

## 2013-02-25 DIAGNOSIS — I10 Essential (primary) hypertension: Secondary | ICD-10-CM

## 2013-02-25 DIAGNOSIS — R5381 Other malaise: Secondary | ICD-10-CM

## 2013-02-25 DIAGNOSIS — I739 Peripheral vascular disease, unspecified: Secondary | ICD-10-CM

## 2013-02-25 LAB — CBC WITH DIFFERENTIAL/PLATELET
Basophils Relative: 1 % (ref 0–1)
Eosinophils Absolute: 0.1 10*3/uL (ref 0.0–0.7)
Eosinophils Relative: 3 % (ref 0–5)
HCT: 38 % (ref 36.0–46.0)
Hemoglobin: 13.1 g/dL (ref 12.0–15.0)
Lymphs Abs: 1.3 10*3/uL (ref 0.7–4.0)
MCH: 32.1 pg (ref 26.0–34.0)
MCHC: 34.5 g/dL (ref 30.0–36.0)
MCV: 93.1 fL (ref 78.0–100.0)
Monocytes Absolute: 0.3 10*3/uL (ref 0.1–1.0)
Monocytes Relative: 8 % (ref 3–12)
Neutrophils Relative %: 56 % (ref 43–77)

## 2013-02-25 LAB — LIPID PANEL
Cholesterol: 103 mg/dL (ref 0–200)
HDL: 49 mg/dL (ref >39)
LDL Cholesterol: 39 mg/dL (ref 0–99)
Triglycerides: 76 mg/dL (ref <150)
VLDL: 15 mg/dL (ref 0–40)

## 2013-02-25 LAB — COMPREHENSIVE METABOLIC PANEL
Alkaline Phosphatase: 71 U/L (ref 39–117)
BUN: 12 mg/dL (ref 6–23)
CO2: 26 mEq/L (ref 19–32)
Glucose, Bld: 94 mg/dL (ref 70–99)
Total Bilirubin: 0.4 mg/dL (ref 0.3–1.2)

## 2013-02-25 NOTE — Progress Notes (Signed)
   Subjective:    Patient ID: [name] [name], female    DOB: 1931/11/27, 79 y.o.   MRN: [id number]  HPI She is here for medication check. She continues on medications listed in the chart. She has had some difficulty with her diuretics causing polyuria and is concerned over continuing them. She does have a cardiac disease history as well as PVD earache she does have difficulty with walking short distances causing her legs to become weak. She also complains of generalized fatigue. Family and social history were reviewed.   Review of Systems Negative except as above    Objective:   Physical Exam alert and in no distress. Tympanic membranes and canals are normal. Throat is clear. Tonsils are normal. Neck is supple without adenopathy or thyromegaly. Cardiac exam shows a regular sinus rhythm without murmurs or gallops. Lungs are clear to auscultation. Abdominal exam shows no masses or tenderness with normal bowel sounds.       Assessment & Plan:  Fatigue - Plan: TSH  PVD (peripheral vascular disease)  Hypertension - Plan: CBC with Differential, Comprehensive metabolic panel  Hyperlipidemia - Plan: Lipid panel  Encounter for long-term (current) use of other medications - Plan: Pneumococcal polysaccharide vaccine 23-valent greater than or equal to 2yo subcutaneous/IM  Essential hypertension, benign  Cardiomegaly  continue on present medications. Encouraged her to take the diuretics in the morning and potentially take everyone's later on in the day. Pneumococcal vaccine given.

## 2013-03-20 ENCOUNTER — Encounter (HOSPITAL_COMMUNITY): Payer: Medicare Other

## 2013-03-20 ENCOUNTER — Ambulatory Visit: Payer: Medicare Other | Admitting: Vascular Surgery

## 2013-04-28 ENCOUNTER — Telehealth: Payer: Self-pay | Admitting: Family Medicine

## 2013-04-28 NOTE — Telephone Encounter (Signed)
[employer] chart review

## 2013-05-12 ENCOUNTER — Telehealth: Payer: Self-pay | Admitting: Vascular Surgery

## 2013-05-22 ENCOUNTER — Other Ambulatory Visit (HOSPITAL_COMMUNITY): Payer: Medicare Other

## 2013-05-22 ENCOUNTER — Encounter (HOSPITAL_COMMUNITY): Payer: Medicare Other

## 2013-05-22 ENCOUNTER — Ambulatory Visit: Payer: Medicare Other | Admitting: Vascular Surgery

## 2013-06-02 ENCOUNTER — Telehealth: Payer: Self-pay | Admitting: Internal Medicine

## 2013-06-02 MED ORDER — ATENOLOL-CHLORTHALIDONE 100-25 MG PO TABS: ORAL_TABLET | ORAL | Status: DC

## 2013-06-02 NOTE — Telephone Encounter (Signed)
Is this ok to refill?  

## 2013-06-02 NOTE — Telephone Encounter (Signed)
Refill request for atenol/chlor 100-25mg  to [facility]

## 2013-08-26 ENCOUNTER — Telehealth: Payer: Self-pay | Admitting: Internal Medicine

## 2013-08-26 DIAGNOSIS — I10 Essential (primary) hypertension: Secondary | ICD-10-CM

## 2013-08-26 MED ORDER — SIMVASTATIN 80 MG PO TABS: 40.0000 mg | ORAL_TABLET | Freq: Every day | ORAL | Status: DC

## 2013-08-26 MED ORDER — ATENOLOL-CHLORTHALIDONE 100-25 MG PO TABS: ORAL_TABLET | ORAL | Status: DC

## 2013-08-26 MED ORDER — SPIRONOLACTONE 25 MG PO TABS
25.0000 mg | ORAL_TABLET | Freq: Every day | ORAL | Status: DC
Start: 2013-08-26 — End: 2014-02-05

## 2013-08-26 NOTE — Telephone Encounter (Signed)
Pt states she needs a refill on the atenolol (which was refilled but pharmacy didn't get refills on it, in march), needs simvastatin 80mg , spironolactone 25mg  to [facility]

## 2014-02-05 ENCOUNTER — Telehealth: Payer: Self-pay | Admitting: Family Medicine

## 2014-02-05 ENCOUNTER — Other Ambulatory Visit: Payer: Self-pay | Admitting: Family Medicine

## 2014-02-05 DIAGNOSIS — I10 Essential (primary) hypertension: Secondary | ICD-10-CM

## 2014-02-05 MED ORDER — SPIRONOLACTONE 25 MG PO TABS: 25.0000 mg | ORAL_TABLET | Freq: Every day | ORAL | Status: DC

## 2014-02-05 NOTE — Telephone Encounter (Signed)
Patient has appt scheduled for Jan 2016 with Dr.Lalonde.

## 2014-02-09 ENCOUNTER — Other Ambulatory Visit: Payer: Self-pay

## 2014-02-09 NOTE — Telephone Encounter (Signed)
LOOKS AS IF V TOOK CARE OF THIS

## 2014-02-12 ENCOUNTER — Encounter (HOSPITAL_COMMUNITY): Payer: Self-pay | Admitting: Vascular Surgery

## 2014-02-16 ENCOUNTER — Encounter: Payer: Self-pay | Admitting: Internal Medicine

## 2014-03-16 ENCOUNTER — Ambulatory Visit (INDEPENDENT_AMBULATORY_CARE_PROVIDER_SITE_OTHER): Payer: Commercial Managed Care - HMO | Admitting: Family Medicine

## 2014-03-16 ENCOUNTER — Encounter: Payer: Self-pay | Admitting: Family Medicine

## 2014-03-16 VITALS — BP 128/80 | HR 60 | Ht 60.0 in | Wt 180.0 lb

## 2014-03-16 DIAGNOSIS — I70219 Atherosclerosis of native arteries of extremities with intermittent claudication, unspecified extremity: Secondary | ICD-10-CM

## 2014-03-16 DIAGNOSIS — Z79899 Other long term (current) drug therapy: Secondary | ICD-10-CM

## 2014-03-16 DIAGNOSIS — I517 Cardiomegaly: Secondary | ICD-10-CM

## 2014-03-16 DIAGNOSIS — I739 Peripheral vascular disease, unspecified: Secondary | ICD-10-CM | POA: Diagnosis not present

## 2014-03-16 DIAGNOSIS — E669 Obesity, unspecified: Secondary | ICD-10-CM

## 2014-03-16 DIAGNOSIS — Z0000 Encounter for general adult medical examination without abnormal findings: Secondary | ICD-10-CM

## 2014-03-16 DIAGNOSIS — E785 Hyperlipidemia, unspecified: Secondary | ICD-10-CM

## 2014-03-16 DIAGNOSIS — M159 Polyosteoarthritis, unspecified: Secondary | ICD-10-CM

## 2014-03-16 DIAGNOSIS — I10 Essential (primary) hypertension: Secondary | ICD-10-CM

## 2014-03-16 LAB — LIPID PANEL
CHOL/HDL RATIO: 2.2 ratio
CHOLESTEROL: 93 mg/dL (ref 0–200)
HDL: 42 mg/dL (ref >39)
LDL CALC: 38 mg/dL (ref 0–99)
Triglycerides: 64 mg/dL (ref <150)
VLDL: 13 mg/dL (ref 0–40)

## 2014-03-16 LAB — CBC WITH DIFFERENTIAL/PLATELET
BASOS ABS: 0 10*3/uL (ref 0.0–0.1)
Basophils Relative: 1 % (ref 0–1)
EOS ABS: 0.2 10*3/uL (ref 0.0–0.7)
EOS PCT: 4 % (ref 0–5)
HCT: 37.7 % (ref 36.0–46.0)
Hemoglobin: 12.9 g/dL (ref 12.0–15.0)
LYMPHS ABS: 1.4 10*3/uL (ref 0.7–4.0)
LYMPHS PCT: 34 % (ref 12–46)
MCH: 32.7 pg (ref 26.0–34.0)
MCHC: 34.2 g/dL (ref 30.0–36.0)
MCV: 95.7 fL (ref 78.0–100.0)
MONOS PCT: 9 % (ref 3–12)
MPV: 9.5 fL (ref 8.6–12.4)
Monocytes Absolute: 0.4 10*3/uL (ref 0.1–1.0)
NEUTROS PCT: 52 % (ref 43–77)
Neutro Abs: 2.1 10*3/uL (ref 1.7–7.7)
PLATELETS: 238 10*3/uL (ref 150–400)
RBC: 3.94 MIL/uL (ref 3.87–5.11)
RDW: 13.3 % (ref 11.5–15.5)
WBC: 4.1 10*3/uL (ref 4.0–10.5)

## 2014-03-16 LAB — COMPREHENSIVE METABOLIC PANEL
ALBUMIN: 3.9 g/dL (ref 3.5–5.2)
ALK PHOS: 65 U/L (ref 39–117)
ALT: 12 U/L (ref 0–35)
AST: 19 U/L (ref 0–37)
BUN: 17 mg/dL (ref 6–23)
CHLORIDE: 99 meq/L (ref 96–112)
CO2: 28 mEq/L (ref 19–32)
CREATININE: 0.73 mg/dL (ref 0.50–1.10)
Calcium: 9.6 mg/dL (ref 8.4–10.5)
Glucose, Bld: 85 mg/dL (ref 70–99)
Potassium: 3.7 mEq/L (ref 3.5–5.3)
Sodium: 136 mEq/L (ref 135–145)
Total Bilirubin: 0.6 mg/dL (ref 0.2–1.2)
Total Protein: 7 g/dL (ref 6.0–8.3)

## 2014-03-16 MED ORDER — SPIRONOLACTONE 25 MG PO TABS: 25.0000 mg | ORAL_TABLET | Freq: Every day | ORAL | Status: DC

## 2014-03-16 MED ORDER — ATENOLOL-CHLORTHALIDONE 100-25 MG PO TABS: ORAL_TABLET | ORAL | Status: DC

## 2014-03-16 MED ORDER — SIMVASTATIN 80 MG PO TABS: 40.0000 mg | ORAL_TABLET | Freq: Every day | ORAL | Status: DC

## 2014-03-16 NOTE — Progress Notes (Signed)
   Subjective:    Patient ID: [name] [name], female    DOB: 01-05-1932, 81 y.o.   MRN: [id number]  HPI She is here for a complete examination. She does have a previous history of PVD with vascular surgery but is having no leg pains. She also has history of cardiomegaly. She has been seen in the past by Dr. Wynonia Lawman. She is having no chest pain, shortness of breath, irregular heart rate, DOE. She continues on medications listed in the chart. She has lost a little bit overweight and is involved in an exercise program. She continues on her statin and is having no difficulty with that. She does have arthritis and is handling this pretty well. She has no other concerns or complaints.   Review of Systems  All other systems reviewed and are negative.      Objective:   Physical Exam BP 128/80 mmHg  Pulse 60  Ht 5' (1.524 m)  Wt 180 lb (81.647 kg)  BMI 35.15 kg/m2  SpO2 98%  General Appearance:    Alert, cooperative, no distress, appears stated age  Head:    Normocephalic, without obvious abnormality, atraumatic  Eyes:    PERRL, conjunctiva/corneas clear, EOM's intact  Ears:    Normal TM's and external ear canals  Nose:   Nares normal, mucosa normal, no drainage or sinus   tenderness  Throat:   Lips, mucosa, and tongue normal; teeth and gums normal  Neck:   Supple, no lymphadenopathy;  thyroid:  no   enlargement/tenderness/nodules; no carotid   bruit or JVD  Back:    Spine nontender, no curvature, ROM normal, no CVA     tenderness  Lungs:     Clear to auscultation bilaterally without wheezes, rales or     ronchi; respirations unlabored  Chest Wall:    No tenderness or deformity   Heart:    Regular rate and rhythm, S1 and S2 normal, no murmur, rub   or gallop  Breast Exam:    Deferred to GYN  Abdomen:     Soft, non-tender, nondistended, normoactive bowel sounds,    no masses, no hepatosplenomegaly  Genitalia:    Deferred to GYN     Extremities:   No clubbing, cyanosis or edema  Pulses:    2+ and symmetric all extremities  Skin:   Skin color, texture, turgor normal, no rashes or lesions  Lymph nodes:   Cervical, supraclavicular, and axillary nodes normal  Neurologic:   CNII-XII intact, normal strength, sensation and gait; reflexes 2+ and symmetric throughout          Psych:   Normal mood, affect, hygiene and grooming.          Assessment & Plan:  Atherosclerosis of native arteries of extremity with intermittent claudication  Essential hypertension, benign - Plan: atenolol-chlorthalidone (TENORETIC) 100-25 MG per tablet  Hyperlipidemia - Plan: simvastatin (ZOCOR) 80 MG tablet  Osteoarthritis of multiple joints, unspecified osteoarthritis type  Cardiomegaly - Plan: CBC with Differential, Comprehensive metabolic panel, Lipid panel  PVD (peripheral vascular disease) - Plan: CBC with Differential, Comprehensive metabolic panel, Lipid panel  Essential hypertension - Plan: spironolactone (ALDACTONE) 25 MG tablet  Encounter for long-term (current) use of medications - Plan: CBC with Differential, Comprehensive metabolic panel, Lipid panel  Routine general medical examination at a health care facility  Obesity  I encouraged her to increase her physical activity as much as tolerated. Continue on present medications.

## 2014-03-17 ENCOUNTER — Other Ambulatory Visit: Payer: Self-pay

## 2014-03-17 DIAGNOSIS — E785 Hyperlipidemia, unspecified: Secondary | ICD-10-CM

## 2014-03-17 DIAGNOSIS — I10 Essential (primary) hypertension: Secondary | ICD-10-CM

## 2014-03-17 MED ORDER — ATENOLOL-CHLORTHALIDONE 100-25 MG PO TABS: ORAL_TABLET | ORAL | Status: DC

## 2014-03-17 MED ORDER — TRIAMCINOLONE ACETONIDE 0.5 % EX CREA: TOPICAL_CREAM | CUTANEOUS | Status: DC

## 2014-03-17 MED ORDER — SIMVASTATIN 80 MG PO TABS: 40.0000 mg | ORAL_TABLET | Freq: Every day | ORAL | Status: DC

## 2014-03-17 MED ORDER — SPIRONOLACTONE 25 MG PO TABS: 25.0000 mg | ORAL_TABLET | Freq: Every day | ORAL | Status: DC

## 2015-01-01 ENCOUNTER — Encounter: Payer: Self-pay | Admitting: Cardiology

## 2015-01-01 ENCOUNTER — Telehealth: Payer: Self-pay | Admitting: Internal Medicine

## 2015-01-01 DIAGNOSIS — K219 Gastro-esophageal reflux disease without esophagitis: Secondary | ICD-10-CM | POA: Diagnosis not present

## 2015-01-01 DIAGNOSIS — I119 Hypertensive heart disease without heart failure: Secondary | ICD-10-CM | POA: Diagnosis not present

## 2015-01-01 DIAGNOSIS — I517 Cardiomegaly: Secondary | ICD-10-CM | POA: Diagnosis not present

## 2015-01-01 DIAGNOSIS — M353 Polymyalgia rheumatica: Secondary | ICD-10-CM | POA: Diagnosis not present

## 2015-01-01 DIAGNOSIS — E668 Other obesity: Secondary | ICD-10-CM | POA: Diagnosis not present

## 2015-01-01 DIAGNOSIS — I739 Peripheral vascular disease, unspecified: Secondary | ICD-10-CM | POA: Diagnosis not present

## 2015-01-01 DIAGNOSIS — I447 Left bundle-branch block, unspecified: Secondary | ICD-10-CM | POA: Diagnosis not present

## 2015-01-01 DIAGNOSIS — I480 Paroxysmal atrial fibrillation: Secondary | ICD-10-CM | POA: Diagnosis not present

## 2015-01-01 DIAGNOSIS — E785 Hyperlipidemia, unspecified: Secondary | ICD-10-CM | POA: Diagnosis not present

## 2015-01-01 NOTE — Progress Notes (Signed)
Patient ID: BALEIGH RENNAKER, female   DOB: 1931/06/27, 81 y.o.   MRN: [id number]   Dinorah, Masullo    Date of visit:  01/01/2015 DOB:  04-04-1931    Age:  81 yrs. Medical record number:  [id number]     Account number:  [id number] Primary Care Provider: Jill Alexanders C ____________________________ CURRENT DIAGNOSES  1. Peripheral vascular disease, unspecified  2. Cardiomegaly  3. Hypertensive heart disease without heart failure  4. Hyperlipidemia  5. Left bundle-branch block  6. Paroxysmal atrial fibrillation  7. Polymyalgia rheumatica  8. Gastro-esophageal reflux disease without esophagitis  9. Obesity  10. Giant cell arteritis with polymyalgia rheumatica ____________________________ ALLERGIES  Penicillins, Intolerance-unknown ____________________________ MEDICATIONS  1. Tylenol 325 mg tablet, 2 qd +PRN  2. aspirin 81 mg tablet, chewable, 1 p.o. daily  3. atenolol-chlorthalidone 100-25 mg tablet, 1 p.o. daily  4. Fish Oil 1,000 mg capsule, 1 p.o. daily  5. Osteo Bi-Flex 250-200 mg tablet, 1 p.o. daily  6. multivitamin tablet, 1 p.o. daily  7. simvastatin 80 mg tablet, 1/2 tab daily  8. Aleve 220 mg tablet, PRN  9. Calcium 600 + D(3) 600 mg(1,500mg ) -400 unit tablet, 1 p.o. daily  10. spironolactone 25 mg tablet, 1 p.o. daily ____________________________ CHIEF COMPLAINTS  Followup of Hypertensive heart disease without heart failure ____________________________ HISTORY OF PRESENT ILLNESS Patient seen for cardiac followup. She has been doing well since she was previously here. She denies angina and has no PND, orthopnea, syncope, palpitations, or claudication. Her lipids were reviewed today and are under good control. She continues with left bundle branch block and is not having much in the way of arthritis. ____________________________ PAST HISTORY  Past Medical Illnesses:  hypertension, hyperlipidemia, peripheral vascular disease, osteoarthritis, GERD, asthma, history of polymylagia  rheumatica, obesity;  Cardiovascular Illnesses:  atrial fibrillation, peripheral vascular disease, conduction disorder-LBBB;  Surgical Procedures:  cataract extraction OU, right shoulder surgery, umbilical hernia, ventral hernia repair, temporal artery biopsy, Fem-tib bypass 9/04 Dr. Oneida Alar, D and C;  NYHA Classification:  II;  Canadian Angina Classification:  Class 0: Asymptomatic;  Cardiology Procedures-Invasive:  cardiac cath (left) 2004;  Cardiology Procedures-Noninvasive:  echocardiogram, echocardiogram May 2013, lexiscan cardiolite May 2013, event monitor May 2013;  Cardiac Cath Results:  no significant disease;  Peripheral Vascular Procedures:  carotid doppler May 2013   (mild plaque bilat, RECA of 1-40%);  LVEF of 45% documented via echocardiogram on 08/04/2011,   ____________________________ CARDIO-PULMONARY TEST DATES EKG Date:  11/08/2012;   Cardiac Cath Date:  11/06/2002;  Holter/Event Monitor Date: 07/20/2011;  Nuclear Study Date:  07/20/2011;  Echocardiography Date: 07/19/2011;  Chest Xray Date: 02/07/2011;   ____________________________ FAMILY HISTORY Brother -- Brother dead, Cancer Brother -- Cancer, Brother dead, Deceased Brother -- Brother alive and well Father -- Cancer, Father dead, Deceased Mother -- Mother dead, Death due to natural cause, Deceased Sister -- CVA, Coronary Artery Disease, Sister alive with problem Sister -- Stomach cancer, Sister dead, Deceased Sister -- Alzheimer's disease Son -- Coronary Artery Disease, Deceased ____________________________ SOCIAL HISTORY Alcohol Use:  no alcohol use;  Smoking:  never smoked;  Diet:  regular diet;  Lifestyle:  married x 29 years;  Exercise:  water aerobics;  Occupation:  Probation officer and food service;  Residence:  lives with husband;   ____________________________ REVIEW OF SYSTEMS General:  obesity, weight loss of approximately 10 lbs Eyes: denies diplopia, history of glaucoma or visual problems. Respiratory: denies  dyspnea, cough, wheezing or hemoptysis. Cardiovascular:  please review HPI Abdominal: denies  dyspepsia, GI bleeding, constipation, or diarrheaGenitourinary-Female: frequency, nocturia Musculoskeletal:  arthritis of the knees  ____________________________ PHYSICAL EXAMINATION VITAL SIGNS  Blood Pressure:  124/70 Sitting, Right arm, regular cuff  , 130/76 Standing, Right arm and regular cuff   Pulse:  76/min. Weight:  183.00 lbs. Height:  62"BMI: 33  Constitutional:  pleasant African American female in no acute distress, moderately obese walks with cane Head:  normocephalic, normal hair pattern, no masses or tenderness ENT:  ears, nose and throat unremarkable, full mouth dentures present Neck:  supple, without massess. No JVD, thyromegaly or carotid bruits. Carotid upstroke normal., prominent carotid impulse on the right Chest:  normal symmetry, clear to auscultation Cardiac:  regular rhythm, normal S1 and S2, No S3 or S4, no murmurs, gallops or rubs detected. Peripheral Pulses:  left femoral bruit, reduced pedal pulses on the left, 1-2+ on the right Extremities & Back:  no deformities, clubbing, cyanosis, erythema or edema observed. Normal muscle strength and tone. Neurological:  no gross motor or sensory deficits noted, affect appropriate, oriented x3. ____________________________ MOST RECENT LIPID PANEL 03/16/14  CHOL TOTL 93 mg/dl, LDL 38 NM, HDL 42 mg/dl and TRIGLYCER 64 mg/dl ____________________________ IMPRESSIONS/PLAN  1. Hypertensive heart disease control 2. Peripheral vascular disease without claudication 3. Hyperlipidemia controlled 4. Obesity with need to lose weight 5. History of osteoarthritis and polymyalgia still problematic  Recommendations:  Discussed weight loss. No symptoms of peripheral vascular disease. Plans to see in followup in one year.  ____________________________ Cardiology Physician:  Kerry Hough MD [facility]

## 2015-01-01 NOTE — Telephone Encounter (Signed)
Pt is at dr. Wynonia Lawman office right now and got new insurance and was not aware she had to have a referral.  Doctor-Dr. Wynonia Lawman NPI- [id number] [employer] ID- [id number] Dx Code- I73.9, I11.9   This was approved through [employer]. Auth ID- [id number]

## 2015-04-05 ENCOUNTER — Encounter: Payer: Self-pay | Admitting: Family Medicine

## 2015-04-05 ENCOUNTER — Ambulatory Visit (INDEPENDENT_AMBULATORY_CARE_PROVIDER_SITE_OTHER): Payer: Commercial Managed Care - HMO | Admitting: Family Medicine

## 2015-04-05 VITALS — BP 122/80 | HR 64 | Wt 184.0 lb

## 2015-04-05 DIAGNOSIS — I10 Essential (primary) hypertension: Secondary | ICD-10-CM | POA: Diagnosis not present

## 2015-04-05 DIAGNOSIS — I739 Peripheral vascular disease, unspecified: Secondary | ICD-10-CM | POA: Diagnosis not present

## 2015-04-05 DIAGNOSIS — M159 Polyosteoarthritis, unspecified: Secondary | ICD-10-CM | POA: Diagnosis not present

## 2015-04-05 DIAGNOSIS — E785 Hyperlipidemia, unspecified: Secondary | ICD-10-CM

## 2015-04-05 DIAGNOSIS — I70213 Atherosclerosis of native arteries of extremities with intermittent claudication, bilateral legs: Secondary | ICD-10-CM | POA: Diagnosis not present

## 2015-04-05 DIAGNOSIS — H269 Unspecified cataract: Secondary | ICD-10-CM

## 2015-04-05 DIAGNOSIS — J309 Allergic rhinitis, unspecified: Secondary | ICD-10-CM

## 2015-04-05 DIAGNOSIS — I517 Cardiomegaly: Secondary | ICD-10-CM

## 2015-04-05 LAB — CBC WITH DIFFERENTIAL/PLATELET
Basophils Absolute: 0 10*3/uL (ref 0.0–0.1)
Basophils Relative: 1 % (ref 0–1)
Eosinophils Absolute: 0.1 10*3/uL (ref 0.0–0.7)
Eosinophils Relative: 3 % (ref 0–5)
HCT: 40.3 % (ref 36.0–46.0)
HEMOGLOBIN: 13.8 g/dL (ref 12.0–15.0)
LYMPHS PCT: 33 % (ref 12–46)
Lymphs Abs: 1.5 10*3/uL (ref 0.7–4.0)
MCH: 33.2 pg (ref 26.0–34.0)
MCHC: 34.2 g/dL (ref 30.0–36.0)
MCV: 96.9 fL (ref 78.0–100.0)
MONOS PCT: 9 % (ref 3–12)
MPV: 9.4 fL (ref 8.6–12.4)
Monocytes Absolute: 0.4 10*3/uL (ref 0.1–1.0)
Neutro Abs: 2.4 10*3/uL (ref 1.7–7.7)
Neutrophils Relative %: 54 % (ref 43–77)
PLATELETS: 228 10*3/uL (ref 150–400)
RBC: 4.16 MIL/uL (ref 3.87–5.11)
RDW: 13.3 % (ref 11.5–15.5)
WBC: 4.4 10*3/uL (ref 4.0–10.5)

## 2015-04-05 LAB — COMPREHENSIVE METABOLIC PANEL
ALBUMIN: 4.2 g/dL (ref 3.6–5.1)
ALT: 20 U/L (ref 6–29)
AST: 25 U/L (ref 10–35)
Alkaline Phosphatase: 63 U/L (ref 33–130)
BUN: 12 mg/dL (ref 7–25)
CHLORIDE: 99 mmol/L (ref 98–110)
CO2: 30 mmol/L (ref 20–31)
CREATININE: 0.74 mg/dL (ref 0.60–0.88)
Calcium: 10 mg/dL (ref 8.6–10.4)
Glucose, Bld: 93 mg/dL (ref 65–99)
Potassium: 3.7 mmol/L (ref 3.5–5.3)
SODIUM: 138 mmol/L (ref 135–146)
Total Bilirubin: 0.6 mg/dL (ref 0.2–1.2)
Total Protein: 7.2 g/dL (ref 6.1–8.1)

## 2015-04-05 LAB — LIPID PANEL
Cholesterol: 115 mg/dL — ABNORMAL LOW (ref 125–200)
HDL: 57 mg/dL (ref >=46)
LDL CALC: 45 mg/dL (ref <130)
TRIGLYCERIDES: 67 mg/dL (ref <150)
Total CHOL/HDL Ratio: 2 Ratio (ref <=5.0)
VLDL: 13 mg/dL (ref <30)

## 2015-04-05 MED ORDER — SPIRONOLACTONE 25 MG PO TABS: 25.0000 mg | ORAL_TABLET | Freq: Every day | ORAL | Status: DC

## 2015-04-05 MED ORDER — ATENOLOL-CHLORTHALIDONE 100-25 MG PO TABS
ORAL_TABLET | ORAL | Status: DC
Start: 2015-04-05 — End: 2016-03-24

## 2015-04-05 MED ORDER — SIMVASTATIN 40 MG PO TABS: 40.0000 mg | ORAL_TABLET | Freq: Every day | ORAL | Status: DC

## 2015-04-05 NOTE — Progress Notes (Signed)
Patient ID: [name] [name], female   DOB: 07-Apr-1931, 82 y.o.   MRN: [id number] [name] [name] is a 82 y.o. female who presents for annual wellness visit and follow-up on chronic medical conditions.  She has the following concerns:She does have underlying arthritis but seems be handling this well with over-the-counter medications. She also does have occasional lower extremity pain especially with physical activity and does have a previous history of PVD with intermittent claudication. Her allergies are under good control. She has a previous history of remote asthma but no Unk Lightning recently. She also has cataracts and is being followed for this.   Immunization History  Administered Date(s) Administered  . Influenza Split 11/20/2011, 01/02/2013  . Influenza Whole 12/02/2007, 12/14/2008, 12/23/2010  . Influenza-Unspecified 01/02/2014  . Pneumococcal Conjugate-13 12/06/2004  . Pneumococcal Polysaccharide-23 02/25/2013  . Td 12/06/2004  . Zoster 08/30/2009   Last Pap smear: ? Hasn't had one in years Last mammogram: Hasn't had one in years Last colonoscopy: Not sure if it's time for another. If it's been 10 yrs since last one. Last DEXA: Not sure if she ever had one Dentist: Wears partials Ophtho: Needs a referral with new insurance [employer]/medicare Exercise: yes water exercise 2x weekly rides bike in water  Other doctors caring for patient include: Cardiologist Dr Wynonia Lawman   Depression screen:  See questionnaire below.  Depression screen [facility] 2/9 04/05/2015 03/16/2014 02/25/2013 08/31/2011  Decreased Interest 0 0 0 0  Down, Depressed, Hopeless 0 0 0 0  PHQ - 2 Score 0 0 0 0    Fall Risk Screen: see questionnaire below. Fall Risk  04/05/2015 03/16/2014 02/25/2013 08/31/2011  Falls in the past year? No No No -  Risk for fall due to : - - - Impaired balance/gait    ADL screen:  See questionnaire below Functional Status Survey: Is the patient deaf or have difficulty hearing?: No Does the patient  have difficulty seeing, even when wearing glasses/contacts?:  (severe dry eyes use eye drops/ cataract surgery ) Does the patient have difficulty concentrating, remembering, or making decisions?: No Does the patient have difficulty walking or climbing stairs?: No Does the patient have difficulty dressing or bathing?: No Does the patient have difficulty doing errands alone such as visiting a doctor's office or shopping?:  (patient has poor circulation always has someone with her)Her PVD does keep her from being as physically active as she would like. She has had some deconditioning issues over the last several years.   End of Life Discussion:  Patient has a living will and medical power of attorney  Review of Systems Constitutional: -fever, -chills, -sweats, -unexpected weight change, -anorexia, -fatigue Allergy: -sneezing, -itching,  Dermatology: denies changing moles, rash, lumps, new worrisome lesions ENT: -runny nose, -ear pain, -sore throat, -hoarseness, -sinus pain, -teeth pain, -tinnitus, -hearing loss, -epistaxis Cardiology:  -chest pain, -palpitations, -edema, -orthopnea, -paroxysmal nocturnal dyspnea Respiratory: -cough, -shortness of breath, -dyspnea on exertion, -wheezing, -hemoptysis Gastroenterology: -abdominal pain, -nausea, -vomiting, -diarrhea, -constipation, -blood in stool, -changes in bowel movement, -dysphagia Hematology: -bleeding or bruising problems Musculoskeletal: -arthralgias, -myalgias, -joint swelling, -back pain, -neck pain, -cramping, -gait changes Ophthalmology: -vision changes, -eye redness, -itching, -discharge Urology: -dysuria, -difficulty urinating, -hematuria, -urinary frequency, -urgency, incontinence Neurology: -headache, -weakness, -tingling, -numbness, -speech abnormality, -memory loss, -falls, -dizziness Psychology:  -depressed mood, -agitation, -sleep problems    PHYSICAL EXAM:  BP 122/80 mmHg  Pulse 64  Wt 184 lb (83.462 kg)  SpO2  93%  General Appearance: Alert, cooperative, no distress, appears  stated age Head: Normocephalic, without obvious abnormality, atraumatic Eyes: PERRL, conjunctiva/corneas clear, EOM's intact, fundi benign Ears: Normal TM's and external ear canals Nose: Nares normal, mucosa normal, no drainage or sinus   tenderness Throat: Lips, mucosa, and tongue normal; teeth and gums normal Neck: Supple, no lymphadenopathy; thyroid: no enlargement/tenderness/nodules; no carotid bruit or JVD Back: Spine nontender, no curvature, ROM normal, no CVA tenderness Lungs: Clear to auscultation bilaterally without wheezes, rales or ronchi; respirations unlabored Chest Wall: No tenderness right [place] joint is quite prominent Heart: Regular rate and rhythm, S1 and S2 normal, no murmur, rub or gallop Abdomen: Soft, non-tender, nondistended, normoactive bowel sounds, no masses, no hepatosplenomegaly Rectal: Normal tone, no masses or tenderness; guaiac negative stool Extremities: No clubbing, cyanosis or edema Skin: Skin color, texture, turgor normal, no rashes or lesions Lymph nodes: Cervical, supraclavicular, and axillary nodes normal Neurologic: CNII-XII intact, normal strength, sensation and gait; reflexes 2+ and symmetric throughout Psych: Normal mood, affect, hygiene and grooming.  ASSESSMENT/PLAN: Osteoarthritis of multiple joints, unspecified osteoarthritis type  PVD (peripheral vascular disease) (HCC) - Plan: CBC with Differential/Platelet, Comprehensive metabolic panel, Lipid panel  Atherosclerosis of native artery of both lower extremities with intermittent claudication (HCC) - Plan: CBC with Differential/Platelet, Comprehensive metabolic panel, Lipid panel  Hyperlipidemia - Plan: Lipid panel, simvastatin (ZOCOR) 40 MG tablet  Essential hypertension, benign - Plan: CBC with Differential/Platelet, Comprehensive metabolic panel, atenolol-chlorthalidone (TENORETIC) 100-25 MG tablet  Cardiomegaly  Cataracts,  bilateral - Plan: Ambulatory referral to Ophthalmology  Allergic rhinitis, unspecified allergic rhinitis type  Essential hypertension - Plan: spironolactone (ALDACTONE) 25 MG tablet     Discussed healthy diet, including goals of calcium and vitamin D intake and alcohol recommendations (less than or equal to 1 drink/day) reviewed; regular seatbelt use; changing batteries in smoke detectors.  Immunization recommendations discussed.   Medicare Attestation I have personally reviewed: The patient's medical and social history Their use of alcohol, tobacco or illicit drugs Their current medications and supplements The patient's functional ability including ADLs,fall risks, home safety risks, cognitive, and hearing and visual impairment Diet and physical activities Evidence for depression or mood disorders  The patient's weight, height, and BMI have been recorded in the chart.  I have made referrals, counseling, and provided education to the patient based on review of the above and I have provided the patient with a written personalized care plan for preventive services.     Wyatt Haste, MD   04/05/2015

## 2015-04-12 ENCOUNTER — Telehealth: Payer: Self-pay | Admitting: *Deleted

## 2015-04-12 NOTE — Telephone Encounter (Signed)
Dr Warden Fillers H 53.8 cataract evaluation Auth # [id number]

## 2015-04-13 DIAGNOSIS — H40013 Open angle with borderline findings, low risk, bilateral: Secondary | ICD-10-CM | POA: Diagnosis not present

## 2015-04-13 DIAGNOSIS — Z961 Presence of intraocular lens: Secondary | ICD-10-CM | POA: Diagnosis not present

## 2015-04-13 DIAGNOSIS — H01022 Squamous blepharitis right lower eyelid: Secondary | ICD-10-CM | POA: Diagnosis not present

## 2015-04-13 DIAGNOSIS — H01025 Squamous blepharitis left lower eyelid: Secondary | ICD-10-CM | POA: Diagnosis not present

## 2015-04-13 DIAGNOSIS — H01024 Squamous blepharitis left upper eyelid: Secondary | ICD-10-CM | POA: Diagnosis not present

## 2015-04-13 DIAGNOSIS — H04123 Dry eye syndrome of bilateral lacrimal glands: Secondary | ICD-10-CM | POA: Diagnosis not present

## 2015-04-13 DIAGNOSIS — H01021 Squamous blepharitis right upper eyelid: Secondary | ICD-10-CM | POA: Diagnosis not present

## 2015-06-16 ENCOUNTER — Other Ambulatory Visit: Payer: Self-pay | Admitting: Family Medicine

## 2015-07-28 ENCOUNTER — Ambulatory Visit (INDEPENDENT_AMBULATORY_CARE_PROVIDER_SITE_OTHER): Payer: Commercial Managed Care - HMO | Admitting: Family Medicine

## 2015-07-28 ENCOUNTER — Encounter: Payer: Self-pay | Admitting: Family Medicine

## 2015-07-28 ENCOUNTER — Ambulatory Visit
Admission: RE | Admit: 2015-07-28 | Discharge: 2015-07-28 | Disposition: A | Discharge status: Home / Self Care | Payer: Commercial Managed Care - HMO | Source: Ambulatory Visit | Attending: Family Medicine | Admitting: Family Medicine

## 2015-07-28 VITALS — BP 132/70 | HR 62 | Wt 188.0 lb

## 2015-07-28 DIAGNOSIS — M179 Osteoarthritis of knee, unspecified: Secondary | ICD-10-CM | POA: Diagnosis not present

## 2015-07-28 DIAGNOSIS — M25561 Pain in right knee: Secondary | ICD-10-CM

## 2015-07-28 DIAGNOSIS — M159 Polyosteoarthritis, unspecified: Secondary | ICD-10-CM

## 2015-07-28 NOTE — Progress Notes (Signed)
   Subjective:    Patient ID: [name] [name], female    DOB: 03-05-1932, 82 y.o.   MRN: [id number]  HPI She complains of right knee pain for the last 5 months. She describes a popping and pain and occasionally falling because of this.   Review of Systems     Objective:   Physical Exam  exam shows no effusion. Anterior drawer negative. McMurray testing negative. Medial and  Lateral collateral ligaments are intact. No crepitus noted.       Assessment & Plan:  Osteoarthritis of multiple joints, unspecified osteoarthritis type  Right knee pain - Plan: DG Knee Complete 4 Views Right  if x-ray shows some arthritic changes, will probably have her come back for an injection to see if this will help.

## 2015-08-03 ENCOUNTER — Ambulatory Visit (INDEPENDENT_AMBULATORY_CARE_PROVIDER_SITE_OTHER): Payer: Commercial Managed Care - HMO | Admitting: Family Medicine

## 2015-08-03 ENCOUNTER — Telehealth: Payer: Self-pay

## 2015-08-03 DIAGNOSIS — M159 Polyosteoarthritis, unspecified: Secondary | ICD-10-CM

## 2015-08-03 DIAGNOSIS — M25561 Pain in right knee: Secondary | ICD-10-CM

## 2015-08-03 DIAGNOSIS — N3281 Overactive bladder: Secondary | ICD-10-CM

## 2015-08-03 MED ORDER — LIDOCAINE HCL (PF) 1 % IJ SOLN
2.0000 mL | Freq: Once | INTRAMUSCULAR | Status: AC
  Administered 2015-08-03: 2 mL via INTRADERMAL

## 2015-08-03 MED ORDER — SOLIFENACIN SUCCINATE 5 MG PO TABS: 5.0000 mg | ORAL_TABLET | Freq: Every day | ORAL | Status: DC

## 2015-08-03 MED ORDER — TRIAMCINOLONE ACETONIDE 40 MG/ML IJ SUSP
40.0000 mg | Freq: Once | INTRAMUSCULAR | Status: AC
  Administered 2015-08-03: 40 mg via INTRAMUSCULAR

## 2015-08-03 NOTE — Progress Notes (Signed)
   Subjective:    Patient ID: [name] [name], female    DOB: 06/05/1931, 82 y.o.   MRN: [id number]  HPI She is here for recheck. She had recent x-ray of her right knee which did show extensive medial joint damage. She continues to have significant amount of pain with this. At the end of the encounter, she then mentions difficulty with overactive bladder symptoms of urgency and frequency. But she did not describe any dysuria. This has been getting worse over the last several months. She has not had any  incontinence.  Review of Systems     Objective:   Physical Exam Alert and in no distress. Pain on motion of the knee is noted.       Assessment & Plan:  Right knee pain - Plan: triamcinolone acetonide (KENALOG-40) injection 40 mg, lidocaine (PF) (XYLOCAINE) 1 % injection 2 mL  Osteoarthritis of multiple joints, unspecified osteoarthritis type  OAB (overactive bladder) - Plan: solifenacin (VESICARE) 5 MG tablet Discussed the treatment of her arthritis with her in regard to possible injections with steroids plus joint lubricants as well as possible surgery. She would like to avoid surgery. She is comfortable with trying this steroids. The right part of the knee was prepped with Betadine. The joint line was identified. 40 mg of Kenalog and 3 mL of Xylocaine was injected with some difficulty however she did obtain relief of her symptoms. She will follow-up with me on an as-needed basis. I discussed the fact that if she did not get extensive amount of relief and, further evaluation by orthopedics would be necessary. She is also to call me back concerning how well the medication for her bladder is working.

## 2015-08-03 NOTE — Telephone Encounter (Signed)
Pt called the office to request Vesicare be sent to [facility] on [address] to [place] for convenience of pt./ RLB

## 2016-02-21 DIAGNOSIS — E785 Hyperlipidemia, unspecified: Secondary | ICD-10-CM | POA: Diagnosis not present

## 2016-02-21 DIAGNOSIS — M353 Polymyalgia rheumatica: Secondary | ICD-10-CM | POA: Diagnosis not present

## 2016-02-21 DIAGNOSIS — E668 Other obesity: Secondary | ICD-10-CM | POA: Diagnosis not present

## 2016-02-21 DIAGNOSIS — I119 Hypertensive heart disease without heart failure: Secondary | ICD-10-CM | POA: Diagnosis not present

## 2016-02-21 DIAGNOSIS — K219 Gastro-esophageal reflux disease without esophagitis: Secondary | ICD-10-CM | POA: Diagnosis not present

## 2016-02-21 DIAGNOSIS — I739 Peripheral vascular disease, unspecified: Secondary | ICD-10-CM | POA: Diagnosis not present

## 2016-02-21 DIAGNOSIS — I447 Left bundle-branch block, unspecified: Secondary | ICD-10-CM | POA: Diagnosis not present

## 2016-02-21 DIAGNOSIS — I517 Cardiomegaly: Secondary | ICD-10-CM | POA: Diagnosis not present

## 2016-02-21 DIAGNOSIS — I480 Paroxysmal atrial fibrillation: Secondary | ICD-10-CM | POA: Diagnosis not present

## 2016-03-24 ENCOUNTER — Other Ambulatory Visit: Payer: Self-pay | Admitting: Family Medicine

## 2016-03-24 DIAGNOSIS — I10 Essential (primary) hypertension: Secondary | ICD-10-CM

## 2016-04-04 NOTE — Telephone Encounter (Signed)
This encounter was created in error - please disregard.

## 2016-07-01 ENCOUNTER — Other Ambulatory Visit: Payer: Self-pay | Admitting: Family Medicine

## 2016-07-01 DIAGNOSIS — E785 Hyperlipidemia, unspecified: Secondary | ICD-10-CM

## 2016-08-30 ENCOUNTER — Other Ambulatory Visit: Payer: Self-pay | Admitting: Family Medicine

## 2016-08-30 DIAGNOSIS — E785 Hyperlipidemia, unspecified: Secondary | ICD-10-CM

## 2016-08-30 DIAGNOSIS — I10 Essential (primary) hypertension: Secondary | ICD-10-CM

## 2016-08-30 NOTE — Telephone Encounter (Signed)
It has been over a year since pt has been in to be seen and its been a year and half since pt has had her lipids checked. Pt was advised I could only send in a 30 days to her pharmacy. She did not want it to go to a local pharmacy so I will send in 30 days to mail order. Pt is scheduled to come in July 19th for fasting med check.

## 2016-09-21 ENCOUNTER — Ambulatory Visit (INDEPENDENT_AMBULATORY_CARE_PROVIDER_SITE_OTHER): Payer: Medicare HMO | Admitting: Family Medicine

## 2016-09-21 ENCOUNTER — Encounter: Payer: Self-pay | Admitting: Family Medicine

## 2016-09-21 VITALS — BP 126/80 | HR 70 | Ht 60.0 in | Wt 176.0 lb

## 2016-09-21 DIAGNOSIS — F4329 Adjustment disorder with other symptoms: Secondary | ICD-10-CM

## 2016-09-21 DIAGNOSIS — I70213 Atherosclerosis of native arteries of extremities with intermittent claudication, bilateral legs: Secondary | ICD-10-CM

## 2016-09-21 DIAGNOSIS — I739 Peripheral vascular disease, unspecified: Secondary | ICD-10-CM

## 2016-09-21 DIAGNOSIS — R221 Localized swelling, mass and lump, neck: Secondary | ICD-10-CM | POA: Diagnosis not present

## 2016-09-21 DIAGNOSIS — M25561 Pain in right knee: Secondary | ICD-10-CM

## 2016-09-21 DIAGNOSIS — E785 Hyperlipidemia, unspecified: Secondary | ICD-10-CM

## 2016-09-21 DIAGNOSIS — G8929 Other chronic pain: Secondary | ICD-10-CM

## 2016-09-21 DIAGNOSIS — I517 Cardiomegaly: Secondary | ICD-10-CM | POA: Diagnosis not present

## 2016-09-21 DIAGNOSIS — N3281 Overactive bladder: Secondary | ICD-10-CM | POA: Diagnosis not present

## 2016-09-21 DIAGNOSIS — I10 Essential (primary) hypertension: Secondary | ICD-10-CM

## 2016-09-21 DIAGNOSIS — M159 Polyosteoarthritis, unspecified: Secondary | ICD-10-CM

## 2016-09-21 LAB — CBC WITH DIFFERENTIAL/PLATELET
BASOS PCT: 1 %
Basophils Absolute: 38 cells/uL (ref 0–200)
EOS PCT: 2 %
Eosinophils Absolute: 76 cells/uL (ref 15–500)
HEMATOCRIT: 40.4 % (ref 35.0–45.0)
Hemoglobin: 13.5 g/dL (ref 11.7–15.5)
LYMPHS ABS: 1216 {cells}/uL (ref 850–3900)
LYMPHS PCT: 32 %
MCH: 33.1 pg — ABNORMAL HIGH (ref 27.0–33.0)
MCHC: 33.4 g/dL (ref 32.0–36.0)
MCV: 99 fL (ref 80.0–100.0)
MONO ABS: 418 {cells}/uL (ref 200–950)
MPV: 9.6 fL (ref 7.5–12.5)
Monocytes Relative: 11 %
Neutro Abs: 2052 cells/uL (ref 1500–7800)
Neutrophils Relative %: 54 %
Platelets: 238 10*3/uL (ref 140–400)
RBC: 4.08 MIL/uL (ref 3.80–5.10)
RDW: 13.3 % (ref 11.0–15.0)
WBC: 3.8 10*3/uL — AB (ref 4.0–10.5)

## 2016-09-21 MED ORDER — TRIAMCINOLONE ACETONIDE 40 MG/ML IJ SUSP
40.0000 mg | Freq: Once | INTRAMUSCULAR | Status: AC
  Administered 2016-09-21: 40 mg via INTRAMUSCULAR

## 2016-09-21 MED ORDER — LIDOCAINE HCL 2 % IJ SOLN
3.0000 mL | Freq: Once | INTRAMUSCULAR | Status: AC
  Administered 2016-09-21: 60 mg via INTRADERMAL

## 2016-09-21 MED ORDER — ATENOLOL-CHLORTHALIDONE 100-25 MG PO TABS: 1.0000 | ORAL_TABLET | Freq: Every day | ORAL | 3 refills | Status: DC

## 2016-09-21 MED ORDER — SIMVASTATIN 40 MG PO TABS: 40.0000 mg | ORAL_TABLET | Freq: Every day | ORAL | 3 refills | Status: DC

## 2016-09-21 MED ORDER — SPIRONOLACTONE 25 MG PO TABS: 25.0000 mg | ORAL_TABLET | Freq: Every day | ORAL | 3 refills | Status: DC
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
## 2016-09-22 ENCOUNTER — Ambulatory Visit (HOSPITAL_COMMUNITY): Payer: Medicare HMO

## 2016-09-22 LAB — LIPID PANEL
Cholesterol: 104 mg/dL (ref <200)
HDL: 48 mg/dL — AB (ref >50)
LDL CALC: 40 mg/dL (ref <100)
Total CHOL/HDL Ratio: 2.2 Ratio (ref <5.0)
Triglycerides: 81 mg/dL (ref <150)
VLDL: 16 mg/dL (ref <30)

## 2016-09-22 LAB — COMPREHENSIVE METABOLIC PANEL
ALK PHOS: 59 U/L (ref 33–130)
ALT: 14 U/L (ref 6–29)
AST: 24 U/L (ref 10–35)
Albumin: 3.8 g/dL (ref 3.6–5.1)
BILIRUBIN TOTAL: 0.7 mg/dL (ref 0.2–1.2)
BUN: 15 mg/dL (ref 7–25)
CALCIUM: 10.1 mg/dL (ref 8.6–10.4)
CO2: 21 mmol/L (ref 20–31)
CREATININE: 0.82 mg/dL (ref 0.60–0.88)
Chloride: 102 mmol/L (ref 98–110)
GLUCOSE: 91 mg/dL (ref 65–99)
Potassium: 3.9 mmol/L (ref 3.5–5.3)
SODIUM: 139 mmol/L (ref 135–146)
Total Protein: 7.1 g/dL (ref 6.1–8.1)

## 2016-10-26 ENCOUNTER — Other Ambulatory Visit: Payer: Self-pay | Admitting: Family Medicine

## 2016-10-27 ENCOUNTER — Telehealth: Payer: Self-pay

## 2016-10-27 DIAGNOSIS — I10 Essential (primary) hypertension: Secondary | ICD-10-CM

## 2016-10-27 MED ORDER — ATENOLOL-CHLORTHALIDONE 100-25 MG PO TABS: 1.0000 | ORAL_TABLET | Freq: Every day | ORAL | 3 refills | Status: DC

## 2016-10-27 NOTE — Telephone Encounter (Signed)
This was already done when pt was here but I will refill incase they didn't get it

## 2016-10-27 NOTE — Telephone Encounter (Signed)
Pt needs refills of chlorthalidone sent to [facility]. [place] December

## 2016-11-23 ENCOUNTER — Other Ambulatory Visit: Payer: Self-pay

## 2016-11-23 ENCOUNTER — Telehealth: Payer: Self-pay | Admitting: Family Medicine

## 2016-11-23 DIAGNOSIS — E785 Hyperlipidemia, unspecified: Secondary | ICD-10-CM

## 2016-11-23 MED ORDER — SIMVASTATIN 40 MG PO TABS: 40.0000 mg | ORAL_TABLET | Freq: Every day | ORAL | 3 refills | Status: DC

## 2016-11-23 MED ORDER — TRIAMCINOLONE ACETONIDE 0.5 % EX CREA: TOPICAL_CREAM | CUTANEOUS | 3 refills | Status: DC

## 2016-11-23 NOTE — Telephone Encounter (Signed)
Pt called for refills on Kenalog. Please send to [facility] mail order pharm.

## 2017-02-16 DIAGNOSIS — I739 Peripheral vascular disease, unspecified: Secondary | ICD-10-CM | POA: Diagnosis not present

## 2017-02-16 DIAGNOSIS — I119 Hypertensive heart disease without heart failure: Secondary | ICD-10-CM | POA: Diagnosis not present

## 2017-02-16 DIAGNOSIS — M353 Polymyalgia rheumatica: Secondary | ICD-10-CM | POA: Diagnosis not present

## 2017-02-16 DIAGNOSIS — K219 Gastro-esophageal reflux disease without esophagitis: Secondary | ICD-10-CM | POA: Diagnosis not present

## 2017-02-16 DIAGNOSIS — E7849 Other hyperlipidemia: Secondary | ICD-10-CM | POA: Diagnosis not present

## 2017-02-16 DIAGNOSIS — I447 Left bundle-branch block, unspecified: Secondary | ICD-10-CM | POA: Diagnosis not present

## 2017-02-16 DIAGNOSIS — I517 Cardiomegaly: Secondary | ICD-10-CM | POA: Diagnosis not present

## 2017-02-16 DIAGNOSIS — E668 Other obesity: Secondary | ICD-10-CM | POA: Diagnosis not present

## 2017-02-16 DIAGNOSIS — I480 Paroxysmal atrial fibrillation: Secondary | ICD-10-CM | POA: Diagnosis not present

## 2017-08-24 ENCOUNTER — Other Ambulatory Visit: Payer: Self-pay

## 2017-08-24 ENCOUNTER — Emergency Department (HOSPITAL_COMMUNITY)
Admission: EM | Admit: 2017-08-24 | Discharge: 2017-08-24 | Disposition: A | Discharge status: Home / Self Care | Payer: Medicare HMO | Attending: Emergency Medicine | Admitting: Emergency Medicine

## 2017-08-24 ENCOUNTER — Emergency Department (HOSPITAL_COMMUNITY): Payer: Medicare HMO

## 2017-08-24 ENCOUNTER — Encounter (HOSPITAL_COMMUNITY): Payer: Self-pay | Admitting: *Deleted

## 2017-08-24 DIAGNOSIS — Y9301 Activity, walking, marching and hiking: Secondary | ICD-10-CM | POA: Insufficient documentation

## 2017-08-24 DIAGNOSIS — S92341A Displaced fracture of fourth metatarsal bone, right foot, initial encounter for closed fracture: Secondary | ICD-10-CM | POA: Diagnosis not present

## 2017-08-24 DIAGNOSIS — Z7982 Long term (current) use of aspirin: Secondary | ICD-10-CM | POA: Diagnosis not present

## 2017-08-24 DIAGNOSIS — W228XXA Striking against or struck by other objects, initial encounter: Secondary | ICD-10-CM | POA: Insufficient documentation

## 2017-08-24 DIAGNOSIS — S92351A Displaced fracture of fifth metatarsal bone, right foot, initial encounter for closed fracture: Secondary | ICD-10-CM | POA: Diagnosis not present

## 2017-08-24 DIAGNOSIS — Z79899 Other long term (current) drug therapy: Secondary | ICD-10-CM | POA: Insufficient documentation

## 2017-08-24 DIAGNOSIS — I10 Essential (primary) hypertension: Secondary | ICD-10-CM

## 2017-08-24 DIAGNOSIS — Y92009 Unspecified place in unspecified non-institutional (private) residence as the place of occurrence of the external cause: Secondary | ICD-10-CM | POA: Insufficient documentation

## 2017-08-24 DIAGNOSIS — Y999 Unspecified external cause status: Secondary | ICD-10-CM | POA: Diagnosis not present

## 2017-08-24 DIAGNOSIS — J45909 Unspecified asthma, uncomplicated: Secondary | ICD-10-CM | POA: Diagnosis not present

## 2017-08-24 DIAGNOSIS — M25571 Pain in right ankle and joints of right foot: Secondary | ICD-10-CM | POA: Diagnosis not present

## 2017-08-24 DIAGNOSIS — S99921A Unspecified injury of right foot, initial encounter: Secondary | ICD-10-CM | POA: Diagnosis present

## 2017-08-24 MED ORDER — HYDROCODONE-ACETAMINOPHEN 5-325 MG PO TABS: 1.0000 | ORAL_TABLET | Freq: Four times a day (QID) | ORAL | 0 refills | Status: AC | PRN

## 2017-08-24 NOTE — ED Triage Notes (Signed)
The pt is c/o rt foot and ankle pain and swelling 3-4 days ago when she twisted her foor

## 2017-08-24 NOTE — Progress Notes (Signed)
ED CM received consult concerning patient needing a rolling walker. DME Referral sent to [facility] for rolling walker via [facility].

## 2017-08-24 NOTE — ED Provider Notes (Signed)
[facility] EMERGENCY DEPARTMENT Provider Note   CSN: [id number] Arrival date & time: 08/24/17  1709     History   Chief Complaint Chief Complaint  Patient presents with  . Foot Injury    HPI [name] [name] is a 84 y.o. female.  HPI  Patient is an 84 year old female with a history of hypertension, hyperlipidemia, arthritis, asthma, peripheral vascular disease, and atrial fibrillation (not on anticoagulation) presenting for right foot injury.  Patient reports that 3 days ago she got up early in the morning in the dark, and tripped on the closet door that was left open.  Patient reports that she fell down on to her feet with them underneath her.  Patient denies any head or neck trauma the incident.  Patient reports she has had persistent pain and swelling of the right foot, and difficulty bearing weight on it.  Patient denies taking any other anticoagulation at this time besides aspirin.  Patient reports that this was not a syncopal episode preceding her injury.  Patient denies any numbness or weakness of the toes of the right foot.  Past Medical History:  Diagnosis Date  . Allergy   . Arthritis   . Asthma   . Cataract   . Diverticulosis   . Dyslipidemia   . GERD (gastroesophageal reflux disease)   . Hemorrhoids   . History of atrial fibrillation   . Hypertension   . Menopause   . Obesity   . PVD (peripheral vascular disease) [facility])     Patient Active Problem List   Diagnosis Date Noted  . OAB (overactive bladder) 09/21/2016  . PVD (peripheral vascular disease) ([place]) 03/21/2012  . Atherosclerosis of native arteries of extremity with intermittent claudication ([place]) 12/21/2011  . Hyperlipidemia 06/28/2011  . Essential hypertension, benign 06/28/2011  . Osteoarthritis 06/28/2011  . Cardiomegaly 02/07/2011    Past Surgical History:  Procedure Laterality Date  . ABDOMINAL AORTAGRAM N/A 02/09/2012   Procedure: ABDOMINAL Maxcine Ham;  Surgeon: Elam Dutch, MD;  Location: [facility] CATH LAB;  Service: Cardiovascular;  Laterality: N/A;  . COLONOSCOPY  02/2004  . EYE SURGERY     CATARACT (BILATERAL)  . HERNIA REPAIR     abdominal x 2  . right femoral to anterior tibial bypass  2004  . SHOULDER SURGERY     age 80 due to dislocation  . TEMPORAL ARTERY BIOPSY / LIGATION       OB History   None      Home Medications    Prior to Admission medications   Medication Sig Start Date End Date Taking? Authorizing Provider  aspirin 81 MG chewable tablet Chew 81 mg by mouth daily.    [provider]  atenolol-chlorthalidone (TENORETIC) 100-25 MG tablet Take 1 tablet by mouth daily. 10/27/16   Denita Lung, MD  Glucos-MSM-C-Mn-Ginger-Willow (GLUCOSAMINE MSM COMPLEX PO) Take by mouth.    [provider]  Multiple Vitamin (MULITIVITAMIN WITH MINERALS) TABS Take 1 tablet by mouth daily.      [provider]  OVER THE COUNTER MEDICATION daily. OMEGA XL Take 2 Capsule in the Afternoon    [provider]  simvastatin (ZOCOR) 40 MG tablet Take 1 tablet (40 mg total) by mouth at bedtime. 11/23/16   Denita Lung, MD  spironolactone (ALDACTONE) 25 MG tablet Take 1 tablet (25 mg total) by mouth daily. 09/21/16   Denita Lung, MD  triamcinolone cream (KENALOG) 0.5 % APPLY TOPICALLY 3 TIMES  DAILY 11/23/16  Denita Lung, MD    Family History Family History  Problem Relation Age of Onset  . Cancer Sister        died of breast cancer  . Mental retardation Paternal Aunt   . Heart disease Son        died age 80yo  . Hyperlipidemia Son   . Heart attack Son     Social History Social History   Tobacco Use  . Smoking status: Never Smoker  . Smokeless tobacco: Never Used  Substance Use Topics  . Alcohol use: No  . Drug use: No     Allergies   Patient has no known allergies.   Review of Systems Review of Systems  Musculoskeletal: Positive for arthralgias and joint swelling. Negative for gait problem.    Skin: Positive for color change. Negative for wound.  Neurological: Negative for weakness and numbness.     Physical Exam Updated Vital Signs BP 140/67 (BP Location: Left Arm)   Pulse (!) 55   Temp 98.6 F (37 C) (Oral)   Resp 17   Ht 5' (1.524 m)   Wt 74.8 kg (165 lb)   SpO2 98%   BMI 32.22 kg/m   Physical Exam  Constitutional: She appears well-developed and well-nourished. No distress.  Sitting comfortably in bed.  HENT:  Head: Normocephalic and atraumatic.  Eyes: Conjunctivae are normal. Right eye exhibits no discharge. Left eye exhibits no discharge.  EOMs normal to gross examination.  Neck: Normal range of motion.  Cardiovascular: Regular rhythm and normal heart sounds.  No murmur heard. Intact, 2+ DP and PT pulse of RLE. Found with doppler. Bradycardia noted. Normal rhythm.  Pulmonary/Chest:  Normal respiratory effort. Patient converses comfortably. No audible wheeze or stridor.  Abdominal: She exhibits no distension.  Musculoskeletal: Normal range of motion.  Right foot exam: Swelling to the dorsum of the right foot.  No ecchymosis of the plantar aspect of the foot to suggest Lisfranc injury.  There is pain to palpation overlying the right fourth metatarsal.  There is no tenderness to palpation of medial malleolus, lateral malleolus, navicular or base of fifth metatarsal.  Patient has full sensation in all toes of the right lower extremity.  Capillary refill is less than 2 seconds.  Neurological: She is alert.  Cranial nerves intact to gross observation. Patient moves extremities without difficulty.  Skin: Skin is warm and dry. She is not diaphoretic.  Psychiatric: She has a normal mood and affect. Her behavior is normal. Judgment and thought content normal.  Nursing note and vitals reviewed.    ED Treatments / Results  Labs (all labs ordered are listed, but only abnormal results are displayed) Labs Reviewed - No data to display  EKG None  Radiology Dg  Ankle Complete Right  Result Date: 08/24/2017 CLINICAL DATA:  Acute right ankle pain and swelling after twisting injury. EXAM: RIGHT ANKLE - COMPLETE 3+ VIEW COMPARISON:  None. FINDINGS: There is no evidence of fracture, dislocation, or joint effusion. There is no evidence of arthropathy or other focal bone abnormality. Soft tissues are unremarkable. IMPRESSION: No acute abnormality is noted. Electronically Signed   By: Marijo Conception, M.D.   On: 08/24/2017 19:15   Dg Foot Complete Right  Result Date: 08/24/2017 CLINICAL DATA:  Acute right foot pain and swelling after twisting injury. EXAM: RIGHT FOOT COMPLETE - 3+ VIEW COMPARISON:  None. FINDINGS: Mildly displaced fracture is seen involving the distal portion of the fourth metatarsal. Spurring of posterior calcaneus is  noted. Probable old calcaneal fracture is noted. Dorsal soft tissue swelling is noted. Fusion of the distal fifth metatarsal and fifth proximal phalanx is noted. IMPRESSION: Mildly displaced distal fourth metatarsal fracture. Dorsal soft tissue swelling is noted suggesting edema or contusion. Electronically Signed   By: Marijo Conception, M.D.   On: 08/24/2017 19:14    Procedures Procedures (including critical care time)  Medications Ordered in ED Medications - No data to display   Initial Impression / Assessment and Plan / ED Course  I have reviewed the triage vital signs and the nursing notes.  Pertinent labs & imaging results that were available during my care of the patient were reviewed by me and considered in my medical decision making (see chart for details).  Clinical Course as of Aug 24 2008  Fri Aug 24, 2017  2006 I have reviewed the patient's information in the [facility] for the past 12 months and found them to have no Rx on record.  Opiates were prescribed for an acute, painful condition. The patient was given information on side effects and encouraged to use other, non-opiate  pain medication primary, only using opiate medicine sparingly for severe pain.   [AM]    Clinical Course User Index [AM] Albesa Seen, PA-C    Patient is well-appearing and neurovascularly intact in the right lower extremity.  Patient has distal fourth metatarsal fracture, slightly displaced.  Due to patient's balance issues at baseline, will place in cam walker boot.  Patient also has a walker at home that I encouraged her to use at all times until she sees orthopedic surgery.  Patient to follow-up with orthopedic surgery next week.  Patient reports that her walker is out of date, and does not fit well in the house.  Case management was consulted, and a new walker was written for his DME.  Return precautions were given for any increase in pain, swelling, pallor or paresthesias of the right foot.  Patient is in understanding and agrees with the plan of care.  Of note, pulse was 55 today on my examination.  Patient was in normal rhythm on exam, and asymptomatic.  Patient is on a beta-blocker.   This is a supervised visit with Dr. Tanna Furry. Evaluation, management, and discharge planning discussed with this attending physician.  Final Clinical Impressions(s) / ED Diagnoses   Final diagnoses:  Displaced fracture of fourth metatarsal bone, right foot, initial encounter for closed fracture  Elevated blood pressure reading in office with diagnosis of hypertension    ED Discharge Orders    None       Tamala Julian 08/25/17 Gertie Gowda, MD 08/25/17 1601

## 2017-08-24 NOTE — Discharge Instructions (Signed)
Please see the information and instructions below regarding your visit.  Your diagnoses today include:  1. Displaced fracture of fourth metatarsal bone, right foot, initial encounter for closed fracture   2. Elevated blood pressure reading in office with diagnosis of hypertension     Tests performed today include: See side panel of your discharge paperwork for testing performed today. Vital signs are listed at the bottom of these instructions.   Your x-ray shows that you have a fracture at 1 of the foot bones.  Medications prescribed:    Take any prescribed medications only as prescribed, and any over the counter medications only as directed on the packaging.  You have been prescribed Norco for pain. ONLY TAKE THIS MEDICATION EVERY 6 HOURS AS NEEDED FOR PAIN. This is an opioid pain medication. Only take this medication if you need it for breakthrough pain.   If no longer need this medication, you may take Tylenol per your usually scheduled regimen of it.  Do not combine Tylenol with this medication.  Do not combine this medication with Tylenol, as it may increase the risk of liver problems.  Do not combine this medication with alcohol.  Please be advised to avoid driving or operating heavy machinery while taking this medication, as it may make you drowsy or impair judgment.   Home care instructions:  Please follow any educational materials contained in this packet.  Until you see Dr. Stann Mainland of orthopedics, please use the walker at all times while wearing the boot.  You may keep the boot on at all times when you are up and about throughout the day.  You may only take it off for showering and bedtime.  Please ensure that you are using a shower chair when you shower, so that you do not lose balance.  Follow-up instructions: Please call Dr. Stann Mainland office first thing Monday morning to schedule an appointment.  Return instructions:  Please return to the Emergency Department if you  experience worsening symptoms.  Please return to the emergency department if you develop any discoloration of the toes or they are turning white or blue, loss of sensation in the toes, increasing swelling of the foot, or increasing pain. Please return if you have any other emergent concerns.  Additional Information:   Your vital signs today were: BP 140/67 (BP Location: Left Arm)    Pulse (!) 55    Temp 98.6 F (37 C) (Oral)    Resp 17    Ht 5' (1.524 m)    Wt 74.8 kg (165 lb)    SpO2 98%    BMI 32.22 kg/m  If your blood pressure (BP) was elevated on multiple readings during this visit above 130 for the top number or above 80 for the bottom number, please have this repeated by your primary care provider within one month. --------------  Thank you for allowing [place] to participate in your care today.

## 2017-08-24 NOTE — ED Notes (Signed)
Paged Ortho  

## 2017-08-24 NOTE — Progress Notes (Signed)
Orthopedic Tech Progress Note Patient Details:  [name] [name] 27-Dec-1931 [id number]  Ortho Devices Type of Ortho Device: CAM walker Ortho Device/Splint Interventions: Application   Post Interventions Patient Tolerated: Well Instructions Provided: Care of device   [name] 08/24/2017, 8:30 PM

## 2017-08-25 DIAGNOSIS — R269 Unspecified abnormalities of gait and mobility: Secondary | ICD-10-CM | POA: Diagnosis not present

## 2017-08-27 ENCOUNTER — Ambulatory Visit: Payer: Self-pay | Admitting: Family Medicine

## 2017-09-12 DIAGNOSIS — M79671 Pain in right foot: Secondary | ICD-10-CM | POA: Diagnosis not present

## 2017-09-12 DIAGNOSIS — S92344A Nondisplaced fracture of fourth metatarsal bone, right foot, initial encounter for closed fracture: Secondary | ICD-10-CM | POA: Diagnosis not present

## 2017-10-10 DIAGNOSIS — M1711 Unilateral primary osteoarthritis, right knee: Secondary | ICD-10-CM | POA: Diagnosis not present

## 2017-10-10 DIAGNOSIS — S92344A Nondisplaced fracture of fourth metatarsal bone, right foot, initial encounter for closed fracture: Secondary | ICD-10-CM | POA: Diagnosis not present

## 2017-10-10 DIAGNOSIS — S92344D Nondisplaced fracture of fourth metatarsal bone, right foot, subsequent encounter for fracture with routine healing: Secondary | ICD-10-CM | POA: Diagnosis not present

## 2017-10-15 ENCOUNTER — Emergency Department (HOSPITAL_COMMUNITY): Payer: Medicare HMO

## 2017-10-15 ENCOUNTER — Emergency Department (HOSPITAL_COMMUNITY)
Admission: EM | Admit: 2017-10-15 | Discharge: 2017-10-15 | Disposition: A | Discharge status: Home / Self Care | Payer: Medicare HMO | Attending: Emergency Medicine | Admitting: Emergency Medicine

## 2017-10-15 ENCOUNTER — Encounter (HOSPITAL_COMMUNITY): Payer: Self-pay | Admitting: *Deleted

## 2017-10-15 DIAGNOSIS — Z7982 Long term (current) use of aspirin: Secondary | ICD-10-CM | POA: Insufficient documentation

## 2017-10-15 DIAGNOSIS — J45909 Unspecified asthma, uncomplicated: Secondary | ICD-10-CM | POA: Insufficient documentation

## 2017-10-15 DIAGNOSIS — Z79899 Other long term (current) drug therapy: Secondary | ICD-10-CM | POA: Diagnosis not present

## 2017-10-15 DIAGNOSIS — I10 Essential (primary) hypertension: Secondary | ICD-10-CM | POA: Diagnosis not present

## 2017-10-15 DIAGNOSIS — R51 Headache: Secondary | ICD-10-CM | POA: Insufficient documentation

## 2017-10-15 DIAGNOSIS — R519 Headache, unspecified: Secondary | ICD-10-CM

## 2017-10-15 DIAGNOSIS — R001 Bradycardia, unspecified: Secondary | ICD-10-CM | POA: Diagnosis not present

## 2017-10-15 LAB — I-STAT CHEM 8, ED
BUN: 18 mg/dL (ref 8–23)
Calcium, Ion: 1.27 mmol/L (ref 1.15–1.40)
Chloride: 92 mmol/L — ABNORMAL LOW (ref 98–111)
Creatinine, Ser: 0.8 mg/dL (ref 0.44–1.00)
Glucose, Bld: 99 mg/dL (ref 70–99)
HEMATOCRIT: 42 % (ref 36.0–46.0)
Hemoglobin: 14.3 g/dL (ref 12.0–15.0)
Potassium: 3.7 mmol/L (ref 3.5–5.1)
SODIUM: 130 mmol/L — AB (ref 135–145)
TCO2: 27 mmol/L (ref 22–32)

## 2017-10-15 LAB — CBC
HCT: 40.7 % (ref 36.0–46.0)
Hemoglobin: 13.3 g/dL (ref 12.0–15.0)
MCH: 32.2 pg (ref 26.0–34.0)
MCHC: 32.7 g/dL (ref 30.0–36.0)
MCV: 98.5 fL (ref 78.0–100.0)
PLATELETS: 226 10*3/uL (ref 150–400)
RBC: 4.13 MIL/uL (ref 3.87–5.11)
RDW: 12 % (ref 11.5–15.5)
WBC: 6.3 10*3/uL (ref 4.0–10.5)

## 2017-10-15 LAB — DIFFERENTIAL
Abs Immature Granulocytes: 0 10*3/uL (ref 0.0–0.1)
BASOS ABS: 0 10*3/uL (ref 0.0–0.1)
BASOS PCT: 0 %
EOS ABS: 0.1 10*3/uL (ref 0.0–0.7)
Eosinophils Relative: 2 %
IMMATURE GRANULOCYTES: 0 %
Lymphocytes Relative: 26 %
Lymphs Abs: 1.6 10*3/uL (ref 0.7–4.0)
Monocytes Absolute: 0.7 10*3/uL (ref 0.1–1.0)
Monocytes Relative: 11 %
NEUTROS PCT: 61 %
Neutro Abs: 3.8 10*3/uL (ref 1.7–7.7)

## 2017-10-15 LAB — COMPREHENSIVE METABOLIC PANEL
ALBUMIN: 3.9 g/dL (ref 3.5–5.0)
ALT: 21 U/L (ref 0–44)
ANION GAP: 10 (ref 5–15)
AST: 24 U/L (ref 15–41)
Alkaline Phosphatase: 64 U/L (ref 38–126)
BILIRUBIN TOTAL: 0.8 mg/dL (ref 0.3–1.2)
BUN: 16 mg/dL (ref 8–23)
CO2: 28 mmol/L (ref 22–32)
Calcium: 10 mg/dL (ref 8.9–10.3)
Chloride: 93 mmol/L — ABNORMAL LOW (ref 98–111)
Creatinine, Ser: 0.87 mg/dL (ref 0.44–1.00)
GFR calc Af Amer: >60 mL/min (ref >60)
GFR calc non Af Amer: 59 mL/min — ABNORMAL LOW (ref >60)
GLUCOSE: 101 mg/dL — AB (ref 70–99)
POTASSIUM: 3.7 mmol/L (ref 3.5–5.1)
SODIUM: 131 mmol/L — AB (ref 135–145)
TOTAL PROTEIN: 7.2 g/dL (ref 6.5–8.1)

## 2017-10-15 LAB — APTT: APTT: 31 s (ref 24–36)

## 2017-10-15 LAB — I-STAT TROPONIN, ED: Troponin i, poc: 0.01 ng/mL (ref 0.00–0.08)

## 2017-10-15 LAB — PROTIME-INR
INR: 1.07
PROTHROMBIN TIME: 13.8 s (ref 11.4–15.2)

## 2017-10-15 LAB — SEDIMENTATION RATE: Sed Rate: 11 mm/hr (ref 0–22)

## 2017-10-15 LAB — C-REACTIVE PROTEIN

## 2017-10-15 MED ORDER — PREDNISONE 20 MG PO TABS: 60.0000 mg | ORAL_TABLET | Freq: Every day | ORAL | 0 refills | Status: DC

## 2017-10-15 MED ORDER — PREDNISONE 20 MG PO TABS
60.0000 mg | ORAL_TABLET | Freq: Once | ORAL | Status: AC
Start: 2017-10-15 — End: 2017-10-15
  Administered 2017-10-15: 60 mg via ORAL
  Filled 2017-10-15: qty 3

## 2017-10-15 NOTE — Discharge Instructions (Addendum)
You were  seen in the emergency department today for a headache.  Your overall work-up was reassuring without significant problems with your labs or your CT of your head.  We are concerned that you may be having a recurrence of your temporal arteritis.  For this reason we are placing you on prednisone, steroid, to treat this.  We gave you dose of the steroid in the emergency department any prescription that she will need to fill tomorrow.  Please be sure to take the prednisone daily.  Please call your primary care provider tomorrow morning first thing at 8 AM in order to try to set up an appointment for tomorrow during the day-your primary care can help assist you in setting up care with a rheumatologist which is important for this condition..  Please go to Dr. Romeo Apple office, the ophthalmologist provided your discharge instructions, tomorrow morning 8:30 AM for an appointment and reevaluation.  Please bring all prior records and information from your prior temporal arteritis visits in the past.  Return to the ER at anytime for new or worsening symptoms or any other concerns that you may have.

## 2017-10-15 NOTE — ED Provider Notes (Signed)
[facility] EM[facility]GENCY DEPARTMENT Provider Note   CSN: [id number] Arrival date & time: 10/15/17  1804     History   Chief Complaint Chief Complaint  Patient presents with  . Headache    HPI [name] [name] is a 84 y.o. female with history of cataracts s/p surgical intervention, dyslipidemia, hypertension, atrial fibrillation (not on chronic anticoagulation other than ASA), PVD, and prior temporal arteritis who presents to the ED with her daughter for intermittent headache for the past 2 weeks, worse since yesterday.  Patient states pain is located to the bilateral temporal area and radiates into her jaw, right side is worse than left side.  She states that the discomfort was mild in nature, however since yesterday it has gotten worse and has become constant-gradually worsened in nature, described as throbbing. Mildly improved with tylenol PTA. She states that today she did have a brief period of time today where she had some blurry vision bilaterally, right worse than left, however this is resolved at present.  She has experienced jaw claudication.  She reports that this does feel similar to previous temporal arteritis which was treated back in 2004.  She denies numbness, weakness, dizziness like the room eating, chest pain, or difficulty breathing.  Patient states that she is somewhat off balance and walks with a walker at baseline, this is not significantly changed.  Regarding patient's prior providers for her temporal arteritis- initially there was discussion of them being located in [place], upon further clarification she does report they were in [place] but she cannot recall what type of doctor she saw or the name of the doctor and that the person she saw retired.    HPI  Past Medical History:  Diagnosis Date  . Allergy   . Arthritis   . Asthma   . Cataract   . Diverticulosis   . Dyslipidemia   . GERD (gastroesophageal reflux disease)   . Hemorrhoids     . History of atrial fibrillation   . Hypertension   . Menopause   . Obesity   . PVD (peripheral vascular disease) [facility])     Patient Active Problem List   Diagnosis Date Noted  . OAB (overactive bladder) 09/21/2016  . PVD (peripheral vascular disease) ([place]) 03/21/2012  . Atherosclerosis of native arteries of extremity with intermittent claudication ([place]) 12/21/2011  . Hyperlipidemia 06/28/2011  . Essential hypertension, benign 06/28/2011  . Osteoarthritis 06/28/2011  . Cardiomegaly 02/07/2011    Past Surgical History:  Procedure Laterality Date  . ABDOMINAL AORTAGRAM N/A 02/09/2012   Procedure: ABDOMINAL Maxcine Ham;  Surgeon: Elam Dutch, MD;  Location: [facility] CATH LAB;  Service: Cardiovascular;  Laterality: N/A;  . COLONOSCOPY  02/2004  . EYE SURGERY     CATARACT (BILATERAL)  . HERNIA REPAIR     abdominal x 2  . right femoral to anterior tibial bypass  2004  . SHOULDER SURGERY     age 67 due to dislocation  . TEMPORAL ARTERY BIOPSY / LIGATION       OB History   None      Home Medications    Prior to Admission medications   Medication Sig Start Date End Date Taking? Authorizing Provider  aspirin 81 MG chewable tablet Chew 81 mg by mouth daily.    [provider]  atenolol-chlorthalidone (TENORETIC) 100-25 MG tablet Take 1 tablet by mouth daily. 10/27/16   Denita Lung, MD  Glucos-MSM-C-Mn-Ginger-Willow (GLUCOSAMINE MSM COMPLEX PO) Take by mouth.  [provider]  Multiple Vitamin (MULITIVITAMIN WITH MINERALS) TABS Take 1 tablet by mouth daily.      [provider]  OVER THE COUNTER MEDICATION daily. OMEGA XL Take 2 Capsule in the Afternoon    [provider]  simvastatin (ZOCOR) 40 MG tablet Take 1 tablet (40 mg total) by mouth at bedtime. 11/23/16   Denita Lung, MD  spironolactone (ALDACTONE) 25 MG tablet Take 1 tablet (25 mg total) by mouth daily. 09/21/16   Denita Lung, MD  triamcinolone cream (KENALOG) 0.5 % APPLY  TOPICALLY 3 TIMES  DAILY 11/23/16   Denita Lung, MD    Family History Family History  Problem Relation Age of Onset  . Cancer Sister        died of breast cancer  . Mental retardation Paternal Aunt   . Heart disease Son        died age 60yo  . Hyperlipidemia Son   . Heart attack Son     Social History Social History   Tobacco Use  . Smoking status: Never Smoker  . Smokeless tobacco: Never Used  Substance Use Topics  . Alcohol use: No  . Drug use: No     Allergies   Patient has no known allergies.   Review of Systems Review of Systems  Constitutional: Negative for chills and fever.  Eyes: Positive for visual disturbance (resolved at present).  Respiratory: Negative for shortness of breath.   Cardiovascular: Negative for chest pain.  Neurological: Positive for headaches. Negative for dizziness, syncope, weakness and numbness.  All other systems reviewed and are negative.   Physical Exam Updated Vital Signs BP 138/68 (BP Location: Right Arm)   Pulse 97   Temp 98.3 F (36.8 C) (Oral)   Resp 18   SpO2 98%   Physical Exam  Constitutional: She appears well-developed and well-nourished.  Non-toxic appearance. No distress.  HENT:  Head: Normocephalic and atraumatic.  Bilateral temporal tenderness to palpation R>L  Eyes: Pupils are equal, round, and reactive to light. Conjunctivae and EOM are normal. Right eye exhibits no discharge. Left eye exhibits no discharge.  Neck: Neck supple.  Cardiovascular: Normal rate and regular rhythm.  Pulmonary/Chest: Effort normal and breath sounds normal. No respiratory distress. She has no wheezes. She has no rhonchi. She has no rales.  Respiration even and unlabored  Abdominal: Soft. She exhibits no distension. There is no tenderness.  Neurological: She is alert.  Clear speech.  CN III through XII grossly intact.  Sensation grossly intact bilateral upper and lower extremities. 5 out of 5 symmetric grip strength.  5 out of 5  strength plantar dorsiflexion bilaterally.  Negative pronator drift.  Negative Romberg.  Patient is able to ambulate with her walker.  Skin: Skin is warm and dry. No rash noted.  Psychiatric: She has a normal mood and affect. Her behavior is normal.  Nursing note and vitals reviewed.   ED Treatments / Results  Labs (all labs ordered are listed, but only abnormal results are displayed) Labs Reviewed  COMPREHENSIVE METABOLIC PANEL - Abnormal; Notable for the following components:      Result Value   Sodium 131 (*)    Chloride 93 (*)    Glucose, Bld 101 (*)    GFR calc non Af Amer 59 (*)    All other components within normal limits  I-STAT CHEM 8, ED - Abnormal; Notable for the following components:   Sodium 130 (*)    Chloride 92 (*)  All other components within normal limits  PROTIME-INR  APTT  CBC  DIFFERENTIAL  SEDIMENTATION RATE  C-REACTIVE PROTEIN  I-STAT TROPONIN, ED  CBG MONITORING, ED    EKG EKG Interpretation  Date/Time:  Monday October 15 2017 19:26:34 EDT Ventricular Rate:  49 PR Interval:  182 QRS Duration: 88 QT Interval:  414 QTC Calculation: 373 R Axis:   -21 Text Interpretation:  Sinus bradycardia with sinus arrhythmia Left ventricular hypertrophy No significant change since last tracing Confirmed by Blanchie Dessert [phone]) on 10/15/2017 8:22:22 PM   Radiology Ct Head Wo Contrast  Result Date: 10/15/2017 CLINICAL DATA:  Headaches EXAM: CT HEAD WITHOUT CONTRAST TECHNIQUE: Contiguous axial images were obtained from the base of the skull through the vertex without intravenous contrast. COMPARISON:  None. FINDINGS: Brain: Atrophic changes and scattered white matter ischemic changes are seen. No findings to suggest acute hemorrhage, acute infarction or space-occupying mass lesion are noted. Vascular: No hyperdense vessel or unexpected calcification. Skull: Normal. Negative for fracture or focal lesion. Sinuses/Orbits: No acute finding. Other: None.  IMPRESSION: Chronic atrophic and ischemic changes without acute abnormality. Electronically Signed   By: Inez Catalina M.D.   On: 10/15/2017 20:29    Procedures Procedures (including critical care time)  Medications Ordered in ED Medications  predniSONE (DELTASONE) tablet 60 mg (has no administration in time range)     Initial Impression / Assessment and Plan / ED Course  I have reviewed the triage vital signs and the nursing notes.  Pertinent labs & imaging results that were available during my care of the patient were reviewed by me and considered in my medical decision making (see chart for details).  Patient presents to the emergency department with bilateral temporal headache as well as brief episode of blurry vision earlier today, R>L.  She has some mild tenderness to palpation to bilateral temporal areas, R>L.  Her visual changes have resolved at this time.  She has no focal neurologic deficits on exam.  History concerning for possible temporal arteritis given history of similar as well as exam.  Will consult ophthalmology for further recommendations, Dr Maryan Rued in agreement.  Lab work is pending.  20:15: CONSULT: Discussed with ophthalmologist Dr. Valetta Close- recommends rheumatologic discussion regarding prednisone dose, follow up in clinic with him tomorrow AM.   Work-up in the emergency department has been fairly benign.  Mild hyponatremia at 131 and hypochloremia at 93.  No leukocytosis.  No anemia.  Renal function within normal limits.  EKG without significant change from last tracing, troponin negative.  CT head without contrast reveals chronic atrophic and ischemic changes without acute abnormality, patient without focal neurologic deficits on exam to raise concern for acute hemorrhagic or ischemic CVA.  Patient's sedimentation rate and C-reactive protein are both within normal limits, this makes giant cell arteritis somewhat less likely, however cannot rule out at this time especially  given patient reports symptoms are very similar.  Unfortunately we do not have rheumatology on call at this facility, will re-page ophthalmologist to discuss further care.  21:26: CONSULT: Discussed case with Dr. Valetta Close, in agreement for starting patient on 60 mg PO daily prednisone.  He will see patient in clinic tomorrow, requesting that she present to his clinic at 8:30 in the morning. Requesting patient have rheumatologic follow-up in order to have them on board for guidance in care, requesting that she call her PCP prior to his evaluation in order to set up close follow-up with PCP as well and to assist in rheumatology  referral. We will also give local rheumatology information to the patient. Asking that she bring any prior records and information from prior providers.    Plan carried out as above.  Patient given dose of prednisone in the emergency department.  I discussed results, treatment plan, need for follow-up, and return precautions with the patient and her daughter at bedside. Provided opportunity for questions, patient and her daughter confirmed understanding and are in agreement with plan.   Findings and plan of care discussed with supervising physician Dr. Maryan Rued who personally evaluated and examined this patient and is in agreement.   Vitals:   10/15/17 1810 10/15/17 2246  BP: 138/68 122/86  Pulse: 97 88  Resp: 18 12  Temp: 98.3 F (36.8 C)   SpO2: 98% 98%    Final Clinical Impressions(s) / ED Diagnoses   Final diagnoses:  Nonintractable headache, unspecified chronicity pattern, unspecified headache type    ED Discharge Orders         Ordered    predniSONE (DELTASONE) 20 MG tablet  Daily with breakfast     10/15/17 2209           Payten Beaumier, [place] R, PA-C 10/16/17 0019    Blanchie Dessert, MD 10/16/17 2212

## 2017-10-15 NOTE — ED Notes (Signed)
Patient transported to CT 

## 2017-10-15 NOTE — ED Provider Notes (Signed)
MSE was initiated and I personally evaluated the patient and placed orders (if any) at  6:37 PM on October 15, 2017.  The patient appears stable so that the remainder of the MSE may be completed by another provider.  Patient placed in Quick Look pathway, seen and evaluated   Chief Complaint: Headache  HPI:   Patient is in 84 year old female with a history of hypertension and CAD, as well as temporal arteritis, patient estimates around 2004 presenting for temporal headache and visual changes earlier today.  Patient reports that her headache began slowly, and felt like her "heartbeat in her head".  Denies fever or chills.  Reports that she had blurred vision temporarily earlier today as well as some jaw claudication while she was eating.  No anticoagulation.  No other weakness or numbness.  ROS: See HPI (one)  Physical Exam:   Gen: No distress  Neuro: Awake and Alert  Skin: Warm    Focused Exam: Pain over right temporal artery. Cranial nerves II-XII intact.  Strength 5 out of 5 in upper and lower extremity's.  Due to high risk history, recommended next available room for patient.   Initiation of care has begun. The patient has been counseled on the process, plan, and necessity for staying for the completion/evaluation, and the remainder of the medical screening examination     Tamala Julian 10/15/17 1841    Pattricia Boss, MD 10/15/17 [phone]

## 2017-10-15 NOTE — ED Triage Notes (Signed)
Pt in reporting headache to the left/top of her head for the last few days, today she "went blind" from it, states her vision became blurry for about 3 hours, she was afraid to get up and walk around, did not notice any specific weakness to her arms or legs, pt feels like the vision change was mostly to her left eye, denies vision changes at this time

## 2017-10-15 NOTE — ED Notes (Signed)
ED Provider at bedside. 

## 2017-10-15 NOTE — ED Notes (Signed)
Patient verbalizes understanding of discharge instructions. Opportunity for questioning and answers were provided. Armband removed by staff, pt discharged from ED.  

## 2017-10-16 ENCOUNTER — Telehealth: Payer: Self-pay | Admitting: Family Medicine

## 2017-10-16 NOTE — Telephone Encounter (Signed)
Called and spoke to Dr. Romeo Apple office and pt has a appointment with them on Thursday. I will forward all ER information per [facility] instructions.

## 2017-10-16 NOTE — Telephone Encounter (Signed)
Pt was made aware [facility]. 

## 2017-10-16 NOTE — Telephone Encounter (Signed)
Call and make sure she is aware that I know about the headache she is having and will wait to hear from Dr. Valetta Close concerning his thoughts.

## 2017-10-16 NOTE — Telephone Encounter (Signed)
Received a call from pt's daughter and pt was in the ER last night. Daughter's understanding was she was to follow up with JCL. JCL was informed and he states that pt was to follow up with Dr. Valetta Close with [facility] ophthalmology. Pt's daughter, Marcie Bal was informed. She was also informed per [facility] that he will be monitoring pt's situation and might need a follow up with him at some point but today she needs to contact Dr. Valetta Close. Per [employer] I will contact Dr. Romeo Apple office and see what information they may need.

## 2017-10-18 DIAGNOSIS — M316 Other giant cell arteritis: Secondary | ICD-10-CM | POA: Diagnosis not present

## 2017-10-18 DIAGNOSIS — Z961 Presence of intraocular lens: Secondary | ICD-10-CM | POA: Diagnosis not present

## 2017-10-18 LAB — HM DIABETES EYE EXAM

## 2017-10-26 ENCOUNTER — Encounter: Payer: Self-pay | Admitting: Family Medicine
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
## 2017-11-09 ENCOUNTER — Other Ambulatory Visit: Payer: Self-pay | Admitting: Family Medicine

## 2017-11-09 DIAGNOSIS — I10 Essential (primary) hypertension: Secondary | ICD-10-CM

## 2017-11-14 ENCOUNTER — Ambulatory Visit: Payer: Medicare HMO | Admitting: Rheumatology

## 2017-11-14 ENCOUNTER — Encounter: Payer: Self-pay | Admitting: Rheumatology

## 2017-11-14 VITALS — BP 136/71 | HR 62 | Resp 15 | Ht <= 58 in | Wt 148.9 lb

## 2017-11-14 DIAGNOSIS — I10 Essential (primary) hypertension: Secondary | ICD-10-CM

## 2017-11-14 DIAGNOSIS — Z8719 Personal history of other diseases of the digestive system: Secondary | ICD-10-CM

## 2017-11-14 DIAGNOSIS — Z8679 Personal history of other diseases of the circulatory system: Secondary | ICD-10-CM

## 2017-11-14 DIAGNOSIS — Z8639 Personal history of other endocrine, nutritional and metabolic disease: Secondary | ICD-10-CM | POA: Diagnosis not present

## 2017-11-14 DIAGNOSIS — Z8739 Personal history of other diseases of the musculoskeletal system and connective tissue: Secondary | ICD-10-CM

## 2017-11-14 DIAGNOSIS — Z8709 Personal history of other diseases of the respiratory system: Secondary | ICD-10-CM

## 2017-11-14 DIAGNOSIS — I517 Cardiomegaly: Secondary | ICD-10-CM | POA: Diagnosis not present

## 2018-01-03 ENCOUNTER — Ambulatory Visit: Payer: Medicare HMO | Admitting: Rheumatology

## 2018-01-29 ENCOUNTER — Ambulatory Visit (INDEPENDENT_AMBULATORY_CARE_PROVIDER_SITE_OTHER): Payer: Medicare HMO | Admitting: Family Medicine

## 2018-01-29 ENCOUNTER — Encounter: Payer: Self-pay | Admitting: Family Medicine

## 2018-01-29 VITALS — BP 122/76 | HR 60 | Temp 97.6°F | Wt 159.0 lb

## 2018-01-29 DIAGNOSIS — R443 Hallucinations, unspecified: Secondary | ICD-10-CM | POA: Diagnosis not present

## 2018-01-29 NOTE — Progress Notes (Signed)
   Subjective:    Patient ID: [name] [name], female    DOB: January 02, 1932, 84 y.o.   MRN: [id number]  HPI She is here for consult concerning seeing people.  She has noted this over the last 2 weeks.  She has made comments to her daughter who is with her today.  She states that the people are usually there at times when no one else is around.  They talk to her and to do things around the apartment.  She states that she recognizes that they are not real but then the next statement out of her indicates that she thinks that they are indeed real. Of note is the fact that her husband died in in roughly 2 weeks later her sister died.  She does live alone  Review of Systems     Objective:   Physical Exam Alert and in no distress.  MMSE score of 30.  PHQ 9 of 12.       Assessment & Plan:  Hallucinations I explained to her and her daughter that I would need to refer her to psychiatry.  Gave her 2 different phone numbers to call concerning this.

## 2018-01-29 NOTE — Patient Instructions (Signed)
[phone] or [phone]

## 2018-02-08 ENCOUNTER — Other Ambulatory Visit: Payer: Self-pay | Admitting: Family Medicine

## 2018-02-08 DIAGNOSIS — I10 Essential (primary) hypertension: Secondary | ICD-10-CM

## 2018-02-08 DIAGNOSIS — E785 Hyperlipidemia, unspecified: Secondary | ICD-10-CM

## 2018-02-08 NOTE — Telephone Encounter (Signed)
Pt has an appt in January 2020   I will refill med

## 2018-02-22 ENCOUNTER — Ambulatory Visit: Payer: Medicare HMO | Admitting: Cardiology

## 2018-02-28 ENCOUNTER — Encounter: Payer: Self-pay | Admitting: Family Medicine

## 2018-02-28 ENCOUNTER — Ambulatory Visit (INDEPENDENT_AMBULATORY_CARE_PROVIDER_SITE_OTHER): Payer: Medicare HMO | Admitting: Family Medicine

## 2018-02-28 VITALS — BP 114/78 | HR 53 | Temp 97.9°F | Wt 153.4 lb

## 2018-02-28 DIAGNOSIS — E785 Hyperlipidemia, unspecified: Secondary | ICD-10-CM

## 2018-02-28 DIAGNOSIS — R443 Hallucinations, unspecified: Secondary | ICD-10-CM | POA: Diagnosis not present

## 2018-02-28 DIAGNOSIS — I10 Essential (primary) hypertension: Secondary | ICD-10-CM

## 2018-02-28 DIAGNOSIS — I517 Cardiomegaly: Secondary | ICD-10-CM

## 2018-02-28 DIAGNOSIS — N3281 Overactive bladder: Secondary | ICD-10-CM | POA: Diagnosis not present

## 2018-02-28 LAB — CBC WITH DIFFERENTIAL/PLATELET
Basophils Absolute: 0 10*3/uL (ref 0.0–0.2)
Basos: 1 %
EOS (ABSOLUTE): 0 10*3/uL (ref 0.0–0.4)
Eos: 1 %
Hematocrit: 39.1 % (ref 34.0–46.6)
Hemoglobin: 13.7 g/dL (ref 11.1–15.9)
Immature Grans (Abs): 0 10*3/uL (ref 0.0–0.1)
Immature Granulocytes: 0 %
Lymphocytes Absolute: 1 10*3/uL (ref 0.7–3.1)
Lymphs: 25 %
MCH: 32.9 pg (ref 26.6–33.0)
MCHC: 35 g/dL (ref 31.5–35.7)
MCV: 94 fL (ref 79–97)
Monocytes Absolute: 0.5 10*3/uL (ref 0.1–0.9)
Monocytes: 12 %
Neutrophils Absolute: 2.5 10*3/uL (ref 1.4–7.0)
Neutrophils: 61 %
Platelets: 263 10*3/uL (ref 150–450)
RBC: 4.17 x10E6/uL (ref 3.77–5.28)
RDW: 11.6 % — ABNORMAL LOW (ref 12.3–15.4)
WBC: 4 10*3/uL (ref 3.4–10.8)

## 2018-02-28 LAB — POCT URINALYSIS DIP (PROADVANTAGE DEVICE)
BILIRUBIN UA: NEGATIVE mg/dL
Bilirubin, UA: NEGATIVE
Blood, UA: NEGATIVE
Glucose, UA: NEGATIVE mg/dL
Leukocytes, UA: NEGATIVE
Nitrite, UA: NEGATIVE
PH UA: 7.5 (ref 5.0–8.0)
PROTEIN UA: NEGATIVE mg/dL
SPECIFIC GRAVITY, URINE: 1.015
Urobilinogen, Ur: 3.5

## 2018-02-28 LAB — LIPID PANEL
CHOL/HDL RATIO: 1.8 ratio (ref 0.0–4.4)
Cholesterol, Total: 108 mg/dL (ref 100–199)
HDL: 61 mg/dL (ref >39)
LDL Calculated: 36 mg/dL (ref 0–99)
Triglycerides: 56 mg/dL (ref 0–149)
VLDL CHOLESTEROL CAL: 11 mg/dL (ref 5–40)

## 2018-02-28 LAB — COMPREHENSIVE METABOLIC PANEL
ALBUMIN: 4.4 g/dL (ref 3.5–4.7)
ALT: 18 IU/L (ref 0–32)
AST: 24 IU/L (ref 0–40)
Albumin/Globulin Ratio: 1.8 (ref 1.2–2.2)
Alkaline Phosphatase: 69 IU/L (ref 39–117)
BUN / CREAT RATIO: 17 (ref 12–28)
BUN: 14 mg/dL (ref 8–27)
Bilirubin Total: 0.7 mg/dL (ref 0.0–1.2)
CALCIUM: 10.4 mg/dL — AB (ref 8.7–10.3)
CO2: 25 mmol/L (ref 20–29)
Chloride: 91 mmol/L — ABNORMAL LOW (ref 96–106)
Creatinine, Ser: 0.84 mg/dL (ref 0.57–1.00)
GFR calc non Af Amer: 64 mL/min/{1.73_m2} (ref >59)
GFR, EST AFRICAN AMERICAN: 74 mL/min/{1.73_m2} (ref >59)
GLUCOSE: 102 mg/dL — AB (ref 65–99)
Globulin, Total: 2.4 g/dL (ref 1.5–4.5)
Potassium: 3.8 mmol/L (ref 3.5–5.2)
Sodium: 131 mmol/L — ABNORMAL LOW (ref 134–144)
TOTAL PROTEIN: 6.8 g/dL (ref 6.0–8.5)

## 2018-02-28 NOTE — Progress Notes (Signed)
   Subjective:    Patient ID: [name] [name], female    DOB: 1932/01/14, 84 y.o.   MRN: [id number]  HPI She is here for a recheck.  Her daughter is concerned that this could be a urinary tract infection causing some of her psychological issues.  Apparently she urinates regularly throughout the day but then can have from nocturia.  She does not complain of any burning, stinging, urgency during the day.  She states her fluid status is unchanged.  She is still seeing people that are not there and does recognize this is the case. She continues on her simvastatin as well as atenolol/chlorthalidone.  She has not seen cardiology in quite some time but presently is having no chest pain, shortness of breath, PND.  She would like to be switched to a cardiologist here in town. They did not make an appointment with a psychiatrist here in town.  Apparently she is resistant to that. Review of Systems     Objective:   Physical Exam Alert and in no distress. Tympanic membranes and canals are normal. Pharyngeal area is normal. Neck is supple without adenopathy or thyromegaly. Cardiac exam shows a regular sinus rhythm without murmurs or gallops. Lungs are clear to auscultation. Urine dipstick and microscopic was negative       Assessment & Plan:  Hallucinations  Hyperlipidemia, unspecified hyperlipidemia type - Plan: Lipid panel  Essential hypertension, benign - Plan: CBC with Differential/Platelet, Comprehensive metabolic panel  OAB (overactive bladder) - Plan: POCT Urinalysis DIP (Proadvantage Device), CANCELED: Urinalysis Dipstick  Cardiomegaly - Plan: Ambulatory referral to Cardiology At this point the hallucinations are not necessarily interfering with her ability to function and I discussed the possibility of medications having side effects.  We will hold off on further evaluation of the hallucinations since must consider potential side effects of the medication versus benefit.  She and her daughter  are comfortable with that. Also the bladder issue could easily be OAB but medication management would be difficult due to possible side effects.

## 2018-03-01 ENCOUNTER — Telehealth: Payer: Self-pay | Admitting: Family Medicine

## 2018-03-01 ENCOUNTER — Telehealth: Payer: Self-pay

## 2018-03-01 NOTE — Telephone Encounter (Signed)
Called pt daughter per Mrs. Mcginnis wanting to  advise her that she needed to add a little salt to pt for per Dr.Lalonde. And her Cardiology appt is 03-12-18 with Dr. Gwenlyn Found at 2:15pm. [facility]

## 2018-03-01 NOTE — Telephone Encounter (Signed)
Called pt dtr back [phone] reached voice mail advised of Dr. Gwenlyn Found 03/12/2018 at 2:15

## 2018-03-12 ENCOUNTER — Ambulatory Visit: Payer: Medicare HMO | Admitting: Cardiovascular Disease

## 2018-03-12 ENCOUNTER — Encounter: Payer: Self-pay | Admitting: Cardiovascular Disease

## 2018-03-12 DIAGNOSIS — E785 Hyperlipidemia, unspecified: Secondary | ICD-10-CM

## 2018-03-12 DIAGNOSIS — I10 Essential (primary) hypertension: Secondary | ICD-10-CM | POA: Diagnosis not present

## 2018-03-12 NOTE — Assessment & Plan Note (Signed)
History of hyperlipidemia on statin therapy with lipid profile performed 02/28/2018 revealing a total cholesterol 108, LDL 36 and HDL of 61.

## 2018-03-12 NOTE — Assessment & Plan Note (Signed)
History of essential hypertension with blood pressure measured today 118/62.  She is on atenolol and chlorthalidone.

## 2018-03-12 NOTE — Patient Instructions (Signed)
Medication Instructions:  NO CHANGES If you need a refill on your cardiac medications before your next appointment, please call your pharmacy.   Lab work: NONE If you have labs (blood work) drawn today and your tests are completely normal, you will receive your results only by: . MyChart Message (if you have MyChart) OR . A paper copy in the mail If you have any lab test that is abnormal or we need to change your treatment, we will call you to review the results.  Testing/Procedures: NONE  Follow-Up: At [facility], you and your health needs are our priority.  As part of our continuing mission to provide you with exceptional heart care, we have created designated Provider Care Teams.  These Care Teams include your primary Cardiologist (physician) and Advanced Practice Providers (APPs -  Physician Assistants and Nurse Practitioners) who all work together to provide you with the care you need, when you need it. . You will need a follow up appointment in 12 months.  Please call our office 2 months in advance to schedule this appointment.  You may see Dr. Berry or one of the following Advanced Practice Providers on your designated Care Team:   . Luke Kilroy, PA-C . Hao Meng, PA-C . Angela Duke, PA-C . Kathryn Lawrence, DNP . Rhonda Barrett, PA-C . Krista Kroeger, PA-C . Callie Goodrich, PA-C  

## 2018-03-12 NOTE — Progress Notes (Signed)
03/12/2018 [name] [name]   13-Oct-1931  [id number]  Primary Physician Denita Lung, MD Primary Cardiologist: Lorretta Harp MD FACP, [place], [place], [place]  HPI:  [name] [name] is a 85 y.o. moderately overweight widowed African-American female whose husband of 79 years died 07-05-2017.  She is a mother of 2, grandmother 6 grandchildren.  She is accompanied by her daughter [name] [name] his husband Hendricks [name] is also a patient of mine.  She was referred by Dr. Redmond School for ongoing cardiovascular evaluation because her previous cardiologist, Dr. Wynonia Lawman, is currently out on sick leave.  Her risk factors include treated hypertension hyperlipidemia.  She is never had a heart attack or stroke.  She denies chest pain or shortness of breath.  There is no family history of heart disease.  She did lose 130 pounds on her own as result of diet and exercise over 3 years and feels clinically improved.   Current Meds  Medication Sig  . acetaminophen (TYLENOL) 650 MG CR tablet Take 650-1,300 mg by mouth every 8 (eight) hours as needed (arthritis pain).  [name] aspirin EC 81 MG tablet Take 81 mg by mouth daily.  [name] atenolol-chlorthalidone (TENORETIC) 100-25 MG tablet TAKE 1 TABLET EVERY DAY  . Biotin w/ Vitamins C & E (HAIR/SKIN/NAILS PO) Take by mouth daily.  [name] [name] (GLUCOSAMINE MSM COMPLEX PO) Take 1 tablet by mouth daily.   . Multiple Vitamin (MULITIVITAMIN WITH MINERALS) TABS Take 1 tablet by mouth daily.    [name] OVER THE COUNTER MEDICATION Take 500 mg by mouth daily. Beet Root  . OVER THE COUNTER MEDICATION Take 1 capsule by mouth daily. Omega XL  . Polyethyl Glycol-Propyl Glycol (SYSTANE) 0.4-0.3 % SOLN Place 1 drop into both eyes daily.  . simvastatin (ZOCOR) 40 MG tablet TAKE 1 TABLET AT BEDTIME  . sodium chloride (OCEAN) 0.65 % SOLN nasal spray Place 1 spray into both nostrils as needed for congestion.  [name] spironolactone (ALDACTONE) 25 MG tablet TAKE 1 TABLET EVERY DAY  . triamcinolone  cream (KENALOG) 0.5 % APPLY TOPICALLY 3 TIMES  DAILY (Patient taking differently: Apply 1 application topically 3 (three) times daily as needed (itching). )     No Known Allergies  Social History   Socioeconomic History  . Marital status: Widowed    Spouse name: Not on file  . Number of children: Not on file  . Years of education: Not on file  . Highest education level: Not on file  Occupational History  . Not on file  Social Needs  . Financial resource strain: Not on file  . Food insecurity:    Worry: Not on file    Inability: Not on file  . Transportation needs:    Medical: Not on file    Non-medical: Not on file  Tobacco Use  . Smoking status: Never Smoker  . Smokeless tobacco: Never Used  Substance and Sexual Activity  . Alcohol use: No  . Drug use: No  . Sexual activity: Not Currently  Lifestyle  . Physical activity:    Days per week: Not on file    Minutes per session: Not on file  . Stress: Not on file  Relationships  . Social connections:    Talks on phone: Not on file    Gets together: Not on file    Attends religious service: Not on file    Active member of club or organization: Not on file    Attends meetings of clubs or organizations:  Not on file    Relationship status: Not on file  . Intimate partner violence:    Fear of current or ex partner: Not on file    Emotionally abused: Not on file    Physically abused: Not on file    Forced sexual activity: Not on file  Other Topics Concern  . Not on file  Social History Narrative   Lives at home with husband, exercise some with water aerobics 2 days per week, has 3 children, 6 grandchildren, 2 great grandchildren     Review of Systems: General: negative for chills, fever, night sweats or weight changes.  Cardiovascular: negative for chest pain, dyspnea on exertion, edema, orthopnea, palpitations, paroxysmal nocturnal dyspnea or shortness of breath Dermatological: negative for rash Respiratory: negative  for cough or wheezing Urologic: negative for hematuria Abdominal: negative for nausea, vomiting, diarrhea, bright red blood per rectum, melena, or hematemesis Neurologic: negative for visual changes, syncope, or dizziness All other systems reviewed and are otherwise negative except as noted above.    Blood pressure 118/62, pulse 60, height 4\' 9"  (1.448 m), weight 154 lb (69.9 kg).  General appearance: alert and no distress Neck: no adenopathy, no carotid bruit, no JVD, supple, symmetrical, trachea midline and thyroid not enlarged, symmetric, no tenderness/mass/nodules Lungs: clear to auscultation bilaterally Heart: regular rate and rhythm, S1, S2 normal, no murmur, click, rub or gallop Extremities: extremities normal, atraumatic, no cyanosis or edema Pulses: 2+ and symmetric Skin: Skin color, texture, turgor normal. No rashes or lesions Neurologic: Alert and oriented X 3, normal strength and tone. Normal symmetric reflexes. Normal coordination and gait  EKG not performed today  ASSESSMENT AND PLAN:   Hyperlipidemia History of hyperlipidemia on statin therapy with lipid profile performed 02/28/2018 revealing a total cholesterol 108, LDL 36 and HDL of 61.  Essential hypertension, benign History of essential hypertension with blood pressure measured today 118/62.  She is on atenolol and chlorthalidone.      Lorretta Harp MD FACP,FACC,FAHA, [facility] 03/12/2018 3:22 PM

## 2018-03-25 ENCOUNTER — Other Ambulatory Visit: Payer: Self-pay

## 2018-03-25 DIAGNOSIS — R443 Hallucinations, unspecified: Secondary | ICD-10-CM

## 2018-03-26 ENCOUNTER — Ambulatory Visit: Payer: Self-pay | Admitting: Family Medicine

## 2018-03-27 ENCOUNTER — Telehealth: Payer: Self-pay

## 2018-03-27 NOTE — Telephone Encounter (Signed)
Pt was called and she advised that she will have her daughter to call back  And reschedule her med well visit. [place]

## 2018-04-01 ENCOUNTER — Telehealth: Payer: Self-pay

## 2018-04-01 NOTE — Telephone Encounter (Signed)
appt

## 2018-04-01 NOTE — Telephone Encounter (Signed)
Pt daughter advise office that mohter reported to her that she feel and daughter is requesting order for pt to have xrays done at [facility] . Please advise. kh

## 2018-04-01 NOTE — Telephone Encounter (Signed)
Pt has appt tomorrow. [name] [name]

## 2018-04-02 ENCOUNTER — Encounter: Payer: Self-pay | Admitting: Family Medicine

## 2018-04-02 ENCOUNTER — Ambulatory Visit (INDEPENDENT_AMBULATORY_CARE_PROVIDER_SITE_OTHER): Payer: Medicare HMO | Admitting: Family Medicine

## 2018-04-02 VITALS — BP 118/82 | HR 64 | Temp 97.4°F | Wt 153.6 lb

## 2018-04-02 DIAGNOSIS — S300XXA Contusion of lower back and pelvis, initial encounter: Secondary | ICD-10-CM

## 2018-04-02 NOTE — Progress Notes (Signed)
   Subjective:    Patient ID: [name] [name], female    DOB: 1931/10/01, 85 y.o.   MRN: [id number]  HPI She fell at home yesterday injuring the left gluteal area.  She is here for evaluation.   Review of Systems     Objective:   Physical Exam Alert and in no distress.  No palpable pain to the gluteal area.  Full motion of the hip.  No pain with palpation of the femur, knee or tibia.  Exam of the skin shows no ecchymosis.       Assessment & Plan:  Contusion of pelvic region, initial encounter Recommend supportive care and return here as needed.

## 2018-04-04 DIAGNOSIS — I70219 Atherosclerosis of native arteries of extremities with intermittent claudication, unspecified extremity: Secondary | ICD-10-CM | POA: Diagnosis not present

## 2018-04-04 DIAGNOSIS — E785 Hyperlipidemia, unspecified: Secondary | ICD-10-CM | POA: Diagnosis not present

## 2018-04-04 DIAGNOSIS — M1990 Unspecified osteoarthritis, unspecified site: Secondary | ICD-10-CM | POA: Diagnosis not present

## 2018-04-04 DIAGNOSIS — R32 Unspecified urinary incontinence: Secondary | ICD-10-CM | POA: Diagnosis not present

## 2018-04-04 DIAGNOSIS — Z7982 Long term (current) use of aspirin: Secondary | ICD-10-CM | POA: Diagnosis not present

## 2018-04-04 DIAGNOSIS — I10 Essential (primary) hypertension: Secondary | ICD-10-CM | POA: Diagnosis not present

## 2018-04-04 DIAGNOSIS — I739 Peripheral vascular disease, unspecified: Secondary | ICD-10-CM | POA: Diagnosis not present

## 2018-04-04 DIAGNOSIS — Z9181 History of falling: Secondary | ICD-10-CM | POA: Diagnosis not present

## 2018-04-08 ENCOUNTER — Telehealth: Payer: Self-pay

## 2018-04-08 DIAGNOSIS — Z9181 History of falling: Secondary | ICD-10-CM | POA: Diagnosis not present

## 2018-04-08 DIAGNOSIS — I739 Peripheral vascular disease, unspecified: Secondary | ICD-10-CM | POA: Diagnosis not present

## 2018-04-08 DIAGNOSIS — I10 Essential (primary) hypertension: Secondary | ICD-10-CM | POA: Diagnosis not present

## 2018-04-08 DIAGNOSIS — E785 Hyperlipidemia, unspecified: Secondary | ICD-10-CM | POA: Diagnosis not present

## 2018-04-08 DIAGNOSIS — R32 Unspecified urinary incontinence: Secondary | ICD-10-CM | POA: Diagnosis not present

## 2018-04-08 DIAGNOSIS — Z7982 Long term (current) use of aspirin: Secondary | ICD-10-CM | POA: Diagnosis not present

## 2018-04-08 DIAGNOSIS — M1990 Unspecified osteoarthritis, unspecified site: Secondary | ICD-10-CM | POA: Diagnosis not present

## 2018-04-08 DIAGNOSIS — I70219 Atherosclerosis of native arteries of extremities with intermittent claudication, unspecified extremity: Secondary | ICD-10-CM | POA: Diagnosis not present

## 2018-04-08 NOTE — Telephone Encounter (Signed)
Tillie Rung called from [facility] to get verbal orders for home health physical therapy 2x a week for 2 weeks and 1x week for 2 weeks.    Tillie Rung can be reached at [phone]. VM can be left with verbal orders.

## 2018-04-08 NOTE — Telephone Encounter (Signed)
Verbal given. [place]

## 2018-04-08 NOTE — Telephone Encounter (Signed)
Ok

## 2018-04-09 DIAGNOSIS — R32 Unspecified urinary incontinence: Secondary | ICD-10-CM | POA: Diagnosis not present

## 2018-04-09 DIAGNOSIS — I739 Peripheral vascular disease, unspecified: Secondary | ICD-10-CM | POA: Diagnosis not present

## 2018-04-09 DIAGNOSIS — I70219 Atherosclerosis of native arteries of extremities with intermittent claudication, unspecified extremity: Secondary | ICD-10-CM | POA: Diagnosis not present

## 2018-04-09 DIAGNOSIS — Z7982 Long term (current) use of aspirin: Secondary | ICD-10-CM | POA: Diagnosis not present

## 2018-04-09 DIAGNOSIS — Z9181 History of falling: Secondary | ICD-10-CM | POA: Diagnosis not present

## 2018-04-09 DIAGNOSIS — M1990 Unspecified osteoarthritis, unspecified site: Secondary | ICD-10-CM | POA: Diagnosis not present

## 2018-04-09 DIAGNOSIS — E785 Hyperlipidemia, unspecified: Secondary | ICD-10-CM | POA: Diagnosis not present

## 2018-04-09 DIAGNOSIS — I10 Essential (primary) hypertension: Secondary | ICD-10-CM | POA: Diagnosis not present

## 2018-04-11 ENCOUNTER — Telehealth: Payer: Self-pay

## 2018-04-11 NOTE — Telephone Encounter (Signed)
No.  If we need that she will need to be seen

## 2018-04-11 NOTE — Telephone Encounter (Signed)
Should we order the MRI. Pt was seeing some one in the room. At the home health visit. Please advise . [place]

## 2018-04-11 NOTE — Telephone Encounter (Signed)
[name] [name] called and needs verbal for speech therapy. She also may need a MRI for cognitive impairment or dementia . Please advise [facility]

## 2018-04-11 NOTE — Telephone Encounter (Signed)
Ok

## 2018-04-12 NOTE — Telephone Encounter (Signed)
Called pt daughter as well. [place]

## 2018-04-12 NOTE — Telephone Encounter (Signed)
LVM to advise home health. [place]

## 2018-04-16 ENCOUNTER — Telehealth: Payer: Self-pay

## 2018-04-16 DIAGNOSIS — I739 Peripheral vascular disease, unspecified: Secondary | ICD-10-CM | POA: Diagnosis not present

## 2018-04-16 DIAGNOSIS — M1990 Unspecified osteoarthritis, unspecified site: Secondary | ICD-10-CM | POA: Diagnosis not present

## 2018-04-16 DIAGNOSIS — I70219 Atherosclerosis of native arteries of extremities with intermittent claudication, unspecified extremity: Secondary | ICD-10-CM | POA: Diagnosis not present

## 2018-04-16 DIAGNOSIS — I10 Essential (primary) hypertension: Secondary | ICD-10-CM | POA: Diagnosis not present

## 2018-04-16 DIAGNOSIS — E785 Hyperlipidemia, unspecified: Secondary | ICD-10-CM | POA: Diagnosis not present

## 2018-04-16 DIAGNOSIS — R32 Unspecified urinary incontinence: Secondary | ICD-10-CM | POA: Diagnosis not present

## 2018-04-16 DIAGNOSIS — Z9181 History of falling: Secondary | ICD-10-CM | POA: Diagnosis not present

## 2018-04-16 DIAGNOSIS — Z7982 Long term (current) use of aspirin: Secondary | ICD-10-CM | POA: Diagnosis not present

## 2018-04-16 NOTE — Telephone Encounter (Signed)
LVM for daughter to make an appt. So MRI can be scheduled. Cognitive  changes. [place]

## 2018-04-18 ENCOUNTER — Telehealth: Payer: Self-pay | Admitting: Family Medicine

## 2018-04-18 ENCOUNTER — Telehealth: Payer: Self-pay

## 2018-04-18 DIAGNOSIS — I70219 Atherosclerosis of native arteries of extremities with intermittent claudication, unspecified extremity: Secondary | ICD-10-CM | POA: Diagnosis not present

## 2018-04-18 DIAGNOSIS — R32 Unspecified urinary incontinence: Secondary | ICD-10-CM | POA: Diagnosis not present

## 2018-04-18 DIAGNOSIS — Z9181 History of falling: Secondary | ICD-10-CM | POA: Diagnosis not present

## 2018-04-18 DIAGNOSIS — I10 Essential (primary) hypertension: Secondary | ICD-10-CM | POA: Diagnosis not present

## 2018-04-18 DIAGNOSIS — Z7982 Long term (current) use of aspirin: Secondary | ICD-10-CM | POA: Diagnosis not present

## 2018-04-18 DIAGNOSIS — M1990 Unspecified osteoarthritis, unspecified site: Secondary | ICD-10-CM | POA: Diagnosis not present

## 2018-04-18 DIAGNOSIS — E785 Hyperlipidemia, unspecified: Secondary | ICD-10-CM | POA: Diagnosis not present

## 2018-04-18 DIAGNOSIS — I739 Peripheral vascular disease, unspecified: Secondary | ICD-10-CM | POA: Diagnosis not present

## 2018-04-18 NOTE — Telephone Encounter (Signed)
[facility] is requesting to have a nurse eval for med mgmt and U/ A. Please advise [facility]

## 2018-04-18 NOTE — Telephone Encounter (Signed)
Nurse advised . [name] [name]

## 2018-04-18 NOTE — Telephone Encounter (Signed)
ok 

## 2018-04-18 NOTE — Telephone Encounter (Signed)
Minda with [facility] called requesting a verbal. She is requesting a urine test and nursing evaluation. Please call her back at [phone].

## 2018-04-18 NOTE — Telephone Encounter (Signed)
Done [facility] 

## 2018-04-19 DIAGNOSIS — E785 Hyperlipidemia, unspecified: Secondary | ICD-10-CM | POA: Diagnosis not present

## 2018-04-19 DIAGNOSIS — M1990 Unspecified osteoarthritis, unspecified site: Secondary | ICD-10-CM | POA: Diagnosis not present

## 2018-04-19 DIAGNOSIS — Z9181 History of falling: Secondary | ICD-10-CM | POA: Diagnosis not present

## 2018-04-19 DIAGNOSIS — R300 Dysuria: Secondary | ICD-10-CM | POA: Diagnosis not present

## 2018-04-19 DIAGNOSIS — I10 Essential (primary) hypertension: Secondary | ICD-10-CM | POA: Diagnosis not present

## 2018-04-19 DIAGNOSIS — I739 Peripheral vascular disease, unspecified: Secondary | ICD-10-CM | POA: Diagnosis not present

## 2018-04-19 DIAGNOSIS — Z7982 Long term (current) use of aspirin: Secondary | ICD-10-CM | POA: Diagnosis not present

## 2018-04-19 DIAGNOSIS — R32 Unspecified urinary incontinence: Secondary | ICD-10-CM | POA: Diagnosis not present

## 2018-04-19 DIAGNOSIS — I70219 Atherosclerosis of native arteries of extremities with intermittent claudication, unspecified extremity: Secondary | ICD-10-CM | POA: Diagnosis not present

## 2018-04-23 DIAGNOSIS — I70219 Atherosclerosis of native arteries of extremities with intermittent claudication, unspecified extremity: Secondary | ICD-10-CM | POA: Diagnosis not present

## 2018-04-23 DIAGNOSIS — Z9181 History of falling: Secondary | ICD-10-CM | POA: Diagnosis not present

## 2018-04-23 DIAGNOSIS — Z7982 Long term (current) use of aspirin: Secondary | ICD-10-CM | POA: Diagnosis not present

## 2018-04-23 DIAGNOSIS — I739 Peripheral vascular disease, unspecified: Secondary | ICD-10-CM | POA: Diagnosis not present

## 2018-04-23 DIAGNOSIS — M1990 Unspecified osteoarthritis, unspecified site: Secondary | ICD-10-CM | POA: Diagnosis not present

## 2018-04-23 DIAGNOSIS — E785 Hyperlipidemia, unspecified: Secondary | ICD-10-CM | POA: Diagnosis not present

## 2018-04-23 DIAGNOSIS — R32 Unspecified urinary incontinence: Secondary | ICD-10-CM | POA: Diagnosis not present

## 2018-04-23 DIAGNOSIS — I10 Essential (primary) hypertension: Secondary | ICD-10-CM | POA: Diagnosis not present

## 2018-04-25 DIAGNOSIS — E785 Hyperlipidemia, unspecified: Secondary | ICD-10-CM | POA: Diagnosis not present

## 2018-04-25 DIAGNOSIS — Z7982 Long term (current) use of aspirin: Secondary | ICD-10-CM | POA: Diagnosis not present

## 2018-04-25 DIAGNOSIS — M1990 Unspecified osteoarthritis, unspecified site: Secondary | ICD-10-CM | POA: Diagnosis not present

## 2018-04-25 DIAGNOSIS — I739 Peripheral vascular disease, unspecified: Secondary | ICD-10-CM | POA: Diagnosis not present

## 2018-04-25 DIAGNOSIS — I70219 Atherosclerosis of native arteries of extremities with intermittent claudication, unspecified extremity: Secondary | ICD-10-CM | POA: Diagnosis not present

## 2018-04-25 DIAGNOSIS — I10 Essential (primary) hypertension: Secondary | ICD-10-CM | POA: Diagnosis not present

## 2018-04-25 DIAGNOSIS — R32 Unspecified urinary incontinence: Secondary | ICD-10-CM | POA: Diagnosis not present

## 2018-04-25 DIAGNOSIS — Z9181 History of falling: Secondary | ICD-10-CM | POA: Diagnosis not present

## 2018-04-26 ENCOUNTER — Telehealth: Payer: Self-pay

## 2018-04-26 DIAGNOSIS — I70219 Atherosclerosis of native arteries of extremities with intermittent claudication, unspecified extremity: Secondary | ICD-10-CM | POA: Diagnosis not present

## 2018-04-26 DIAGNOSIS — Z7982 Long term (current) use of aspirin: Secondary | ICD-10-CM | POA: Diagnosis not present

## 2018-04-26 DIAGNOSIS — M1990 Unspecified osteoarthritis, unspecified site: Secondary | ICD-10-CM | POA: Diagnosis not present

## 2018-04-26 DIAGNOSIS — I739 Peripheral vascular disease, unspecified: Secondary | ICD-10-CM | POA: Diagnosis not present

## 2018-04-26 DIAGNOSIS — E785 Hyperlipidemia, unspecified: Secondary | ICD-10-CM | POA: Diagnosis not present

## 2018-04-26 DIAGNOSIS — I10 Essential (primary) hypertension: Secondary | ICD-10-CM | POA: Diagnosis not present

## 2018-04-26 DIAGNOSIS — R32 Unspecified urinary incontinence: Secondary | ICD-10-CM | POA: Diagnosis not present

## 2018-04-26 DIAGNOSIS — Z9181 History of falling: Secondary | ICD-10-CM | POA: Diagnosis not present

## 2018-04-26 NOTE — Telephone Encounter (Signed)
Called pt daughter to advise of Culture being negative. Also advised her if they need [place] to call. [place]

## 2018-04-30 DIAGNOSIS — Z9181 History of falling: Secondary | ICD-10-CM | POA: Diagnosis not present

## 2018-04-30 DIAGNOSIS — I739 Peripheral vascular disease, unspecified: Secondary | ICD-10-CM | POA: Diagnosis not present

## 2018-04-30 DIAGNOSIS — I70219 Atherosclerosis of native arteries of extremities with intermittent claudication, unspecified extremity: Secondary | ICD-10-CM | POA: Diagnosis not present

## 2018-04-30 DIAGNOSIS — M1990 Unspecified osteoarthritis, unspecified site: Secondary | ICD-10-CM | POA: Diagnosis not present

## 2018-04-30 DIAGNOSIS — Z7982 Long term (current) use of aspirin: Secondary | ICD-10-CM | POA: Diagnosis not present

## 2018-04-30 DIAGNOSIS — I10 Essential (primary) hypertension: Secondary | ICD-10-CM | POA: Diagnosis not present

## 2018-04-30 DIAGNOSIS — E785 Hyperlipidemia, unspecified: Secondary | ICD-10-CM | POA: Diagnosis not present

## 2018-04-30 DIAGNOSIS — R32 Unspecified urinary incontinence: Secondary | ICD-10-CM | POA: Diagnosis not present

## 2018-05-01 DIAGNOSIS — M1990 Unspecified osteoarthritis, unspecified site: Secondary | ICD-10-CM | POA: Diagnosis not present

## 2018-05-01 DIAGNOSIS — I70219 Atherosclerosis of native arteries of extremities with intermittent claudication, unspecified extremity: Secondary | ICD-10-CM | POA: Diagnosis not present

## 2018-05-01 DIAGNOSIS — Z9181 History of falling: Secondary | ICD-10-CM | POA: Diagnosis not present

## 2018-05-01 DIAGNOSIS — I10 Essential (primary) hypertension: Secondary | ICD-10-CM | POA: Diagnosis not present

## 2018-05-01 DIAGNOSIS — I739 Peripheral vascular disease, unspecified: Secondary | ICD-10-CM | POA: Diagnosis not present

## 2018-05-01 DIAGNOSIS — Z7982 Long term (current) use of aspirin: Secondary | ICD-10-CM | POA: Diagnosis not present

## 2018-05-01 DIAGNOSIS — E785 Hyperlipidemia, unspecified: Secondary | ICD-10-CM | POA: Diagnosis not present

## 2018-05-01 DIAGNOSIS — R32 Unspecified urinary incontinence: Secondary | ICD-10-CM | POA: Diagnosis not present

## 2018-05-02 DIAGNOSIS — I70219 Atherosclerosis of native arteries of extremities with intermittent claudication, unspecified extremity: Secondary | ICD-10-CM | POA: Diagnosis not present

## 2018-05-02 DIAGNOSIS — Z7982 Long term (current) use of aspirin: Secondary | ICD-10-CM | POA: Diagnosis not present

## 2018-05-02 DIAGNOSIS — E785 Hyperlipidemia, unspecified: Secondary | ICD-10-CM | POA: Diagnosis not present

## 2018-05-02 DIAGNOSIS — I739 Peripheral vascular disease, unspecified: Secondary | ICD-10-CM | POA: Diagnosis not present

## 2018-05-02 DIAGNOSIS — Z9181 History of falling: Secondary | ICD-10-CM | POA: Diagnosis not present

## 2018-05-02 DIAGNOSIS — M1990 Unspecified osteoarthritis, unspecified site: Secondary | ICD-10-CM | POA: Diagnosis not present

## 2018-05-02 DIAGNOSIS — I10 Essential (primary) hypertension: Secondary | ICD-10-CM | POA: Diagnosis not present

## 2018-05-02 DIAGNOSIS — R32 Unspecified urinary incontinence: Secondary | ICD-10-CM | POA: Diagnosis not present

## 2018-05-03 DIAGNOSIS — Z7982 Long term (current) use of aspirin: Secondary | ICD-10-CM | POA: Diagnosis not present

## 2018-05-03 DIAGNOSIS — Z9181 History of falling: Secondary | ICD-10-CM | POA: Diagnosis not present

## 2018-05-03 DIAGNOSIS — I739 Peripheral vascular disease, unspecified: Secondary | ICD-10-CM | POA: Diagnosis not present

## 2018-05-03 DIAGNOSIS — R32 Unspecified urinary incontinence: Secondary | ICD-10-CM | POA: Diagnosis not present

## 2018-05-03 DIAGNOSIS — I70219 Atherosclerosis of native arteries of extremities with intermittent claudication, unspecified extremity: Secondary | ICD-10-CM | POA: Diagnosis not present

## 2018-05-03 DIAGNOSIS — M1990 Unspecified osteoarthritis, unspecified site: Secondary | ICD-10-CM | POA: Diagnosis not present

## 2018-05-03 DIAGNOSIS — E785 Hyperlipidemia, unspecified: Secondary | ICD-10-CM | POA: Diagnosis not present

## 2018-05-03 DIAGNOSIS — I10 Essential (primary) hypertension: Secondary | ICD-10-CM | POA: Diagnosis not present

## 2018-05-04 DIAGNOSIS — Z7982 Long term (current) use of aspirin: Secondary | ICD-10-CM | POA: Diagnosis not present

## 2018-05-04 DIAGNOSIS — I739 Peripheral vascular disease, unspecified: Secondary | ICD-10-CM | POA: Diagnosis not present

## 2018-05-04 DIAGNOSIS — M1990 Unspecified osteoarthritis, unspecified site: Secondary | ICD-10-CM | POA: Diagnosis not present

## 2018-05-04 DIAGNOSIS — Z9181 History of falling: Secondary | ICD-10-CM | POA: Diagnosis not present

## 2018-05-04 DIAGNOSIS — I70219 Atherosclerosis of native arteries of extremities with intermittent claudication, unspecified extremity: Secondary | ICD-10-CM | POA: Diagnosis not present

## 2018-05-04 DIAGNOSIS — R32 Unspecified urinary incontinence: Secondary | ICD-10-CM | POA: Diagnosis not present

## 2018-05-04 DIAGNOSIS — I10 Essential (primary) hypertension: Secondary | ICD-10-CM | POA: Diagnosis not present

## 2018-05-04 DIAGNOSIS — E785 Hyperlipidemia, unspecified: Secondary | ICD-10-CM | POA: Diagnosis not present

## 2018-05-07 DIAGNOSIS — I739 Peripheral vascular disease, unspecified: Secondary | ICD-10-CM | POA: Diagnosis not present

## 2018-05-07 DIAGNOSIS — Z9181 History of falling: Secondary | ICD-10-CM | POA: Diagnosis not present

## 2018-05-07 DIAGNOSIS — M1990 Unspecified osteoarthritis, unspecified site: Secondary | ICD-10-CM | POA: Diagnosis not present

## 2018-05-07 DIAGNOSIS — I10 Essential (primary) hypertension: Secondary | ICD-10-CM | POA: Diagnosis not present

## 2018-05-07 DIAGNOSIS — I70219 Atherosclerosis of native arteries of extremities with intermittent claudication, unspecified extremity: Secondary | ICD-10-CM | POA: Diagnosis not present

## 2018-05-07 DIAGNOSIS — R32 Unspecified urinary incontinence: Secondary | ICD-10-CM | POA: Diagnosis not present

## 2018-05-07 DIAGNOSIS — E785 Hyperlipidemia, unspecified: Secondary | ICD-10-CM | POA: Diagnosis not present

## 2018-05-07 DIAGNOSIS — Z7982 Long term (current) use of aspirin: Secondary | ICD-10-CM | POA: Diagnosis not present

## 2018-05-10 ENCOUNTER — Telehealth: Payer: Self-pay | Admitting: Family Medicine

## 2018-05-10 NOTE — Telephone Encounter (Signed)
[name] [name] from Home care wanted to call and let you know that [name] [name] was D/C from home health 05/09/18 she can be reached at [phone] with any questions

## 2018-05-14 ENCOUNTER — Encounter: Payer: Self-pay | Admitting: Family Medicine

## 2018-05-17 ENCOUNTER — Other Ambulatory Visit: Payer: Self-pay | Admitting: Family Medicine

## 2018-05-17 DIAGNOSIS — E785 Hyperlipidemia, unspecified: Secondary | ICD-10-CM

## 2018-05-17 DIAGNOSIS — I10 Essential (primary) hypertension: Secondary | ICD-10-CM

## 2018-09-03 ENCOUNTER — Ambulatory Visit (INDEPENDENT_AMBULATORY_CARE_PROVIDER_SITE_OTHER): Payer: Medicare HMO | Admitting: Family Medicine

## 2018-09-03 ENCOUNTER — Other Ambulatory Visit: Payer: Self-pay

## 2018-09-03 ENCOUNTER — Encounter: Payer: Self-pay | Admitting: Family Medicine

## 2018-09-03 VITALS — Wt 153.0 lb

## 2018-09-03 DIAGNOSIS — I10 Essential (primary) hypertension: Secondary | ICD-10-CM | POA: Diagnosis not present

## 2018-09-03 DIAGNOSIS — R350 Frequency of micturition: Secondary | ICD-10-CM

## 2018-09-03 DIAGNOSIS — E785 Hyperlipidemia, unspecified: Secondary | ICD-10-CM | POA: Diagnosis not present

## 2018-09-03 DIAGNOSIS — M7989 Other specified soft tissue disorders: Secondary | ICD-10-CM

## 2018-09-03 MED ORDER — SIMVASTATIN 40 MG PO TABS: 40.0000 mg | ORAL_TABLET | Freq: Every day | ORAL | 0 refills | Status: DC

## 2018-09-03 MED ORDER — ATENOLOL-CHLORTHALIDONE 100-25 MG PO TABS: 1.0000 | ORAL_TABLET | Freq: Every day | ORAL | 3 refills | Status: DC

## 2018-09-03 NOTE — Progress Notes (Signed)
   Subjective:    Patient ID: [name] [name], female    DOB: [birth year], 85 y.o.   MRN: [id number]  HPI Documentation for virtual telephone encounter.  Documentation for virtual audio and video telecommunications through doximity encounter: The patient was located at home. The provider was located in the office. The patient did consent to this visit and is aware of possible charges through their insurance for this visit. The other persons participating in this telemedicine service was her daughter. Time spent on call was 5 minutes and in review of previous records >20 minutes total. This virtual service is not related to other E/M service within previous 7 days. Her daughter is helping with the virtual visit.  Apparently she is having some difficulty with some leg swelling for the last 3 days.  She does have an underlying history of PVD.  It was difficult to get a good history as to whether anything is changed other than swelling.  She has not been using support hose.  They did not have her medications with her so exactly which pills she is taking was difficult to assess other than she does need a couple of refills.  She has a previous history of temporal arteritis but has not had any headaches in quite some time.  She also complains of difficulty with urinary frequency and is interested in having pads.    Review of Systems     Objective:   Physical Exam Alert and in no distress otherwise not examined       Assessment & Plan:   Encounter Diagnoses  Name Primary?  . Hyperlipidemia, unspecified hyperlipidemia type   . Essential hypertension, benign   . Frequency of urination Yes  . Leg swelling    I did renew her Tenoretic as well as simvastatin however could not really truly assess any of the meds she was on.  Here to bring in her medications tomorrow.  I will also do a urinalysis on her.

## 2018-09-04 ENCOUNTER — Other Ambulatory Visit (INDEPENDENT_AMBULATORY_CARE_PROVIDER_SITE_OTHER): Payer: Medicare HMO

## 2018-09-04 ENCOUNTER — Telehealth: Payer: Self-pay

## 2018-09-04 DIAGNOSIS — R350 Frequency of micturition: Secondary | ICD-10-CM | POA: Diagnosis not present

## 2018-09-04 DIAGNOSIS — E785 Hyperlipidemia, unspecified: Secondary | ICD-10-CM

## 2018-09-04 LAB — POCT URINALYSIS DIP (PROADVANTAGE DEVICE)
Blood, UA: NEGATIVE
Glucose, UA: NEGATIVE mg/dL
Nitrite, UA: NEGATIVE
Specific Gravity, Urine: 1.01
Urobilinogen, Ur: 1
pH, UA: 7

## 2018-09-04 MED ORDER — SIMVASTATIN 40 MG PO TABS: 40.0000 mg | ORAL_TABLET | Freq: Every day | ORAL | 1 refills | Status: DC

## 2018-09-04 MED ORDER — SULFAMETHOXAZOLE-TRIMETHOPRIM 800-160 MG PO TABS: 1.0000 | ORAL_TABLET | Freq: Two times a day (BID) | ORAL | 0 refills | Status: DC

## 2018-09-04 NOTE — Telephone Encounter (Signed)
I called the antibiotic in and renewed her simvastatin

## 2018-09-04 NOTE — Telephone Encounter (Signed)
Please review pt U/ A results and advise. [place]

## 2018-09-04 NOTE — Telephone Encounter (Signed)
lvm for pt daughter advising of med sent into pharmacy. [place]

## 2018-09-04 NOTE — Progress Notes (Signed)
   Subjective:    Patient ID: [name] [name], female    DOB: February 19, 1932, 85 y.o.   MRN: [id number]  HPI    Review of Systems     Objective:   Physical Exam        Assessment & Plan:  Frequency of urination - Plan: POCT Urinalysis DIP (Proadvantage Device), sulfamethoxazole-trimethoprim (BACTRIM DS) 800-160 MG tablet, the urinalysis was positive and I will therefore go ahead and treat her.  Hyperlipidemia, unspecified hyperlipidemia type - Plan: simvastatin (ZOCOR) 40 MG tablet,

## 2018-09-10 ENCOUNTER — Telehealth: Payer: Self-pay

## 2018-09-10 ENCOUNTER — Other Ambulatory Visit: Payer: Self-pay

## 2018-09-10 NOTE — Telephone Encounter (Signed)
Spoke to pt daughter. She advised office that mom did not take the abx and will call office to advise when done to repeat U/A [facility]

## 2018-09-24 ENCOUNTER — Other Ambulatory Visit (INDEPENDENT_AMBULATORY_CARE_PROVIDER_SITE_OTHER): Payer: Medicare HMO

## 2018-09-24 ENCOUNTER — Other Ambulatory Visit: Payer: Self-pay

## 2018-09-24 DIAGNOSIS — R350 Frequency of micturition: Secondary | ICD-10-CM | POA: Diagnosis not present

## 2018-09-24 LAB — POCT URINALYSIS DIP (PROADVANTAGE DEVICE)
Bilirubin, UA: NEGATIVE
Blood, UA: NEGATIVE
Glucose, UA: NEGATIVE mg/dL
Nitrite, UA: NEGATIVE
Protein Ur, POC: NEGATIVE mg/dL
Specific Gravity, Urine: 1.03
Urobilinogen, Ur: NEGATIVE
pH, UA: 5.5 (ref 5.0–8.0)

## 2018-11-27 ENCOUNTER — Telehealth: Payer: Self-pay | Admitting: Family Medicine

## 2018-11-27 ENCOUNTER — Other Ambulatory Visit: Payer: Self-pay

## 2018-11-27 DIAGNOSIS — R443 Hallucinations, unspecified: Secondary | ICD-10-CM

## 2018-11-27 NOTE — Telephone Encounter (Signed)
pts daughter called and is requesting a referral for her neurologist to address her seeing things states It is not getting any better, pts daughter would like her to go the [facility] Dr Mamie Nick, Leonie Man, Marcie Bal can be reached at [phone]

## 2018-11-27 NOTE — Telephone Encounter (Signed)
ok 

## 2018-11-27 NOTE — Telephone Encounter (Signed)
Done [facility] 

## 2018-12-03 ENCOUNTER — Encounter: Payer: Self-pay | Admitting: Neurology

## 2018-12-03 ENCOUNTER — Ambulatory Visit (INDEPENDENT_AMBULATORY_CARE_PROVIDER_SITE_OTHER): Payer: Medicare HMO | Admitting: Neurology

## 2018-12-03 ENCOUNTER — Other Ambulatory Visit: Payer: Self-pay

## 2018-12-03 VITALS — BP 112/72 | HR 67 | Temp 97.0°F | Ht 60.0 in | Wt 134.0 lb

## 2018-12-03 DIAGNOSIS — G309 Alzheimer's disease, unspecified: Secondary | ICD-10-CM

## 2018-12-03 DIAGNOSIS — N3281 Overactive bladder: Secondary | ICD-10-CM | POA: Diagnosis not present

## 2018-12-03 DIAGNOSIS — I739 Peripheral vascular disease, unspecified: Secondary | ICD-10-CM | POA: Diagnosis not present

## 2018-12-03 DIAGNOSIS — E785 Hyperlipidemia, unspecified: Secondary | ICD-10-CM | POA: Diagnosis not present

## 2018-12-03 DIAGNOSIS — I10 Essential (primary) hypertension: Secondary | ICD-10-CM

## 2018-12-03 DIAGNOSIS — I517 Cardiomegaly: Secondary | ICD-10-CM

## 2018-12-03 DIAGNOSIS — R441 Visual hallucinations: Secondary | ICD-10-CM

## 2018-12-03 DIAGNOSIS — R351 Nocturia: Secondary | ICD-10-CM

## 2018-12-03 DIAGNOSIS — I70213 Atherosclerosis of native arteries of extremities with intermittent claudication, bilateral legs: Secondary | ICD-10-CM

## 2018-12-03 DIAGNOSIS — F0390 Unspecified dementia without behavioral disturbance: Secondary | ICD-10-CM | POA: Diagnosis not present

## 2018-12-03 NOTE — Progress Notes (Signed)
SLEEP MEDICINE CLINIC    Provider:  Larey Seat, MD  Primary Care Physician:  Denita Lung, [place] [address]     Referring Provider: Denita Lung, [place],   [id number]          Chief Complaint according to patient   Patient presents with:    . New Patient (Initial Visit)     she is having dreams and she wakes up hallucinations and its causing her to feel its real during the day.. she has the halllucinations throughout the day. depending on the activity during the day it can cause the hallucinations to be better or worse. they state no concerns with memory itself.       HISTORY OF PRESENT ILLNESS:  [name] [name] is a 85 y.o. year old African American female patient seen on 12/03/2018 upon referral by dr Redmond [employer] for a problem with hallucinations, sun- downing, sleep disturbance. .  Chief concern according to patient's daughter, [name] [name] : " we need some sleep at night, me, my husband and my mother-"    I have the pleasure of seeing [name] [name] today, a right-handed [place] or [place] American female with a possible sleep disorder.   She  has a past medical history of Allergy, Arthritis, Asthma, Cataract, Diverticulosis, Dyslipidemia, GERD (gastroesophageal reflux disease), Hemorrhoids, Atrial fibrillation ( not current)  Hypertension, Menopause, Obesity, and PVD (peripheral vascular disease) ([place]), Temporal Arteritis 2004, diagnosed by biopsy.  The patient never had a sleep study , according to patient- she thinks she may have had a sleep study in [place], over 15 years ago. .    Sleep relevant medical history: Nocturia- up to 4 times(!) Sleep walking episodic, Night terrors- none, would get confused after bathroom visit.  other Parasomnia , collarbone fracture.  Family medical /sleep history: no  other family member on CPAP with OSA, insomnia, no other sleep walkers.    Social history:  Patient is retired  from a Corporate treasurer- and lives in a household with 3 persons/daughter and son-in Sports coach.  Family status is widowed.one dog in the home.  Tobacco use: none .  ETOH use; none ,  Caffeine intake in form of Coffee( used to ) Soda( 1/ week) Tea (uncaffeineated herbal) no energy drinks. No regular exercise .  Had bunion surgery- affecting walking.    Sleep habits are as follows: The patient's dinner time is between 6 PM. The patient goes to bed at  8.30 -9.30 PM and only sometimes trouble to sleep. She continues to sleep for 2 hours, wakes for many bathroom breaks, the first time at midnight AM.  Roaming the house. Talking to unseen people- whispering.  The preferred sleep position is supine,  with the support of 2-3 pillows. Dreams are reportedly frequent/vivid.  Very fragmented.  The people she sees never speak- but she is o convinced these are real. Since Novemebr more in daytime. 7-8.30 AM is the usual rise time.  The patient wakes up spontaneously.  She reports not feeling refreshed or restored in AM, with symptoms such as dry mouth, but not morning headaches, and residual fatigue. Naps are taken frequently, unscheduled. , lasting from 20 to 45 minutes in front of the TV.    Review of Systems: Out of a complete 14 system review, the patient complains of only the following symptoms, and all other reviewed systems are negative.:  Fatigue, sleepiness , snoring, nocturia.  fragmented sleep,  Insomnia- lack of routines.    How likely are you to doze in the following situations: 0 = not likely, 1 = slight chance, 2 = moderate chance, 3 = high chance   Sitting and Reading?2 Watching Television? 3 Sitting inactive in a public place (theater or meeting)?1 As a passenger in a car for an hour without a break? 3 Lying down in the afternoon when circumstances permit? 3 Sitting and talking to someone?1 Sitting quietly after lunch without alcohol?2 In a car, while stopped for a few minutes in traffic?0    Total = 14-15/ 24 points   FSS endorsed at 28/ 63 points.   Social History   Socioeconomic History  . Marital status: Widowed    Spouse name: Not on file  . Number of children: Not on file  . Years of education: Not on file  . Highest education level: Not on file  Occupational History  . Not on file  Social Needs  . Financial resource strain: Not on file  . Food insecurity    Worry: Not on file    Inability: Not on file  . Transportation needs    Medical: Not on file    Non-medical: Not on file  Tobacco Use  . Smoking status: Never Smoker  . Smokeless tobacco: Never Used  Substance and Sexual Activity  . Alcohol use: No  . Drug use: No  . Sexual activity: Not Currently  Lifestyle  . Physical activity    Days per week: Not on file    Minutes per session: Not on file  . Stress: Not on file  Relationships  . Social Herbalist on phone: Not on file    Gets together: Not on file    Attends religious service: Not on file    Active member of club or organization: Not on file    Attends meetings of clubs or organizations: Not on file    Relationship status: Not on file  Other Topics Concern  . Not on file  Social History Narrative   Lives at home with husband, exercise some with water aerobics 2 days per week, has 3 children, 6 grandchildren, 2 great grandchildren    Family History  Problem Relation Age of Onset  . Cancer Sister   . Mental retardation Paternal Aunt   . Heart disease Son        died age 68yo  . Hyperlipidemia Son   . Heart attack Son     Past Medical History:  Diagnosis Date  . Allergy   . Arthritis   . Asthma   . Cataract   . Diverticulosis   . Dyslipidemia   . GERD (gastroesophageal reflux disease)   . Hemorrhoids   . History of atrial fibrillation   . Hypertension   . Menopause   . Obesity   . PVD (peripheral vascular disease) ([place])     Past Surgical History:  Procedure Laterality Date  . ABDOMINAL AORTAGRAM N/A  02/09/2012   Procedure: ABDOMINAL Maxcine Ham;  Surgeon: Elam Dutch, MD;  Location: [facility] LAB;  Service: Cardiovascular;  Laterality: N/A;  . COLONOSCOPY  02/2004  . EYE SURGERY     CATARACT (BILATERAL)  . HERNIA REPAIR     abdominal x 2  . right femoral to anterior tibial bypass  2004  . SHOULDER SURGERY     age 52 due to dislocation  . TEMPORAL ARTERY BIOPSY / LIGATION       Current Outpatient  Medications on File Prior to Visit  Medication Sig Dispense Refill  . acetaminophen (TYLENOL) 650 MG CR tablet Take 650-1,300 mg by mouth every 8 (eight) hours as needed (arthritis pain).    [name] aspirin EC 81 MG tablet Take 81 mg by mouth daily.    [name] atenolol-chlorthalidone (TENORETIC) 100-25 MG tablet Take 1 tablet by mouth daily. 90 tablet 3  . Glucos-MSM-C-Mn-Ginger-Willow (GLUCOSAMINE MSM COMPLEX PO) Take 1 tablet by mouth daily.     . Multiple Vitamin (MULITIVITAMIN WITH MINERALS) TABS Take 1 tablet by mouth daily.      [name] OVER THE COUNTER MEDICATION Take 500 mg by mouth daily. Beet Root    . OVER THE COUNTER MEDICATION Take 1 capsule by mouth daily. Omega XL    . Polyethyl Glycol-Propyl Glycol (SYSTANE) 0.4-0.3 % SOLN Place 1 drop into both eyes daily.    . simvastatin (ZOCOR) 40 MG tablet Take 1 tablet (40 mg total) by mouth at bedtime. 90 tablet 1  . sodium chloride (OCEAN) 0.65 % SOLN nasal spray Place 1 spray into both nostrils as needed for congestion.    [name] spironolactone (ALDACTONE) 25 MG tablet TAKE 1 TABLET EVERY DAY 90 tablet 3  . sulfamethoxazole-trimethoprim (BACTRIM DS) 800-160 MG tablet Take 1 tablet by mouth 2 (two) times daily. 20 tablet 0  . triamcinolone cream (KENALOG) 0.5 % APPLY TOPICALLY 3 TIMES  DAILY (Patient taking differently: Apply 1 application topically 3 (three) times daily as needed (itching). ) 240 g 3  . Turmeric (QC TUMERIC COMPLEX PO) Take by mouth.     No current facility-administered medications on file prior to visit.     No Known Allergies   Physical exam:  Today's Vitals   12/03/18 0920  BP: 112/72  Pulse: 67  Temp: (!) 97 F (36.1 C)  Weight: 134 lb (60.8 kg)  Height: 5' (1.524 m)   Body mass index is 26.17 kg/m.   Wt Readings from Last 3 Encounters:  12/03/18 134 lb (60.8 kg)  09/03/18 153 lb (69.4 kg)  04/02/18 153 lb 9.6 oz (69.7 kg)     Ht Readings from Last 3 Encounters:  12/03/18 5' (1.524 m)  03/12/18 4\' 9"  (1.448 m)  11/14/17 4\' 9"  (1.448 m)    MMSE - Mini Mental State Exam 12/03/2018 01/29/2018  Orientation to time 5 5  Orientation to Place 5 5  Registration 3 3  Attention/ Calculation 1 5  Recall 0 2  Language- name 2 objects 2 2  Language- repeat 1 1  Language- follow 3 step command 3 3  Language- read & follow direction 1 1  Write a sentence 1 1  Copy design 0 1  Total score 22 29      General: The patient is awake, alert and appears not in acute distress. The patient is well groomed. Head: Normocephalic, atraumatic. Neck is supple. Mallampati 5,  neck circumference:14. 5 inches . Nasal airflow  patent.  Retrognathia is  seen.  Dental status: edentulous.  Cardiovascular:  Regular rate and cardiac rhythm by pulse,  without distended neck veins. Respiratory: Lungs are clear to auscultation.  Skin:  With  evidence of mild ankle edema, no rash. Trunk: The patient's posture is stooped.    Neurologic exam : The patient is awake and alert, oriented to place and time.   Memory subjective described as intact.  Attention span & concentration ability appears normal.  Speech is fluent,  without  dysarthria, dysphonia or aphasia.  Mood and affect  are appropriate.   Cranial nerves: no loss of smell or taste reported  Pupils are equal and briskly reactive to light. Funduscopic exam deferred. Extraocular movements in vertical and horizontal planes were intact and without nystagmus. No Diplopia. Visual fields by finger perimetry are intact. Hearing was intact to soft voice and finger rubbing.     Facial sensation intact to fine touch.  Facial motor strength is symmetric and tongue and uvula move midline.  Neck ROM : rotation, tilt and flexion extension were normal for age and shoulder shrug was symmetrical.    Motor exam:  Symmetric bulk, tone and ROM.   Normal tone without cog wheeling, symmetric grip strength . Sensory:  Fine touch, pinprick and vibration were tested  and  normal.  Proprioception tested in the upper extremities was normal.  Coordination: Rapid alternating movements in the fingers/hands were of normal speed.  The Finger-to-nose maneuver was intact without evidence of ataxia, dysmetria or tremor. Gait and station: Patient could not rise unassisted from a seated position, walked with a walker- relies on an assistive device.  Stance is of normal width/ base and the patient turned with 5 steps.  Toe and heel walk were deferred.  Deep tendon reflexes: in the  upper and lower extremities are symmetric and intact.  Babinski response was deferred .        After spending a total time of 50 minutes face to face and additional time for physical and neurologic examination, review of laboratory studies,  personal review of imaging studies, reports and results of other testing and review of referral information / records as far as provided in visit, I have established the following assessments:  1)  Decreased memory by MMSE, early stages of dementia. Adjusted for educational level. She left in 7th grade.  2) Sleep hallucinations and now visions of people in the house in daytime, only visual , not auditory-  3) Nocturia and fragmented sleep- unclear if any sleep disordered breathing is present.  4) weight loss - and arthritis, istory of giant cell arteritis. 130 pounds, before Covid 153 pounds.    My Plan is to proceed with:  1) HST- it would be very hard for this patient to attend an in lab study.  2) EEG here. 3) no cog-wheeling, not likely Lewy Body.   I would like to  thank Denita Lung, MD and Denita Lung, [address] [place],  [place] [address] for allowing me to meet with and to take care of this pleasant patient.   In short, [name] [name] is presenting with sun downing, and nocturnal activities characterized by talking to invisible people. , a symptom that can be attributed to neurodegenerative Processes.    I plan to follow up either personally or through our NP within 2-3  month.    Electronically signed by: Larey Seat, MD 12/03/2018 9:41 AM  [facility] Board certified by The [employer] of Sleep Medicine and Diplomate of the [employer] of Sleep Medicine. Board certified In Neurology through the [employer], Fellow of the [employer] of Neurology. Medical Director of [facility].

## 2018-12-04 ENCOUNTER — Telehealth: Payer: Self-pay | Admitting: Neurology

## 2018-12-04 NOTE — Telephone Encounter (Signed)
-----   Message from Larey Seat, MD sent at 12/04/2018  9:54 AM EDT ----- Slightly reduced sodium, low chloride, normal TSH today - 0.628 IU/ml, Vitamin B  12 is stilll pending . Good for MRI with contrast.

## 2018-12-04 NOTE — Telephone Encounter (Signed)
Called the patient's daughter to review the lab work. No answer. LVM for the pt to call back.

## 2018-12-04 NOTE — Telephone Encounter (Signed)
Daughter called back and I was able to review the lab results with her. Informed her that we were still waiting on B level to come back. Advised I would call if that came back abnormal. Informed her these labs indicate safe to complete MRI. Pt's daughter verbalized understanding.

## 2018-12-05 ENCOUNTER — Telehealth: Payer: Self-pay | Admitting: Neurology

## 2018-12-05 NOTE — Telephone Encounter (Signed)
Craig Staggers: [id number] (exp. 12/05/18 to 01/04/19) order sent to GI. They will reach out to the patient to schedule.

## 2018-12-05 NOTE — Telephone Encounter (Signed)
[facility] pending faxed notes  

## 2018-12-07 LAB — COMPREHENSIVE METABOLIC PANEL
ALT: 8 IU/L (ref 0–32)
AST: 15 IU/L (ref 0–40)
Albumin/Globulin Ratio: 1.6 (ref 1.2–2.2)
Albumin: 4.4 g/dL (ref 3.6–4.6)
Alkaline Phosphatase: 73 IU/L (ref 39–117)
BUN/Creatinine Ratio: 15 (ref 12–28)
BUN: 13 mg/dL (ref 8–27)
Bilirubin Total: 0.6 mg/dL (ref 0.0–1.2)
CO2: 25 mmol/L (ref 20–29)
Calcium: 10.4 mg/dL — ABNORMAL HIGH (ref 8.7–10.3)
Chloride: 92 mmol/L — ABNORMAL LOW (ref 96–106)
Creatinine, Ser: 0.84 mg/dL (ref 0.57–1.00)
GFR calc Af Amer: 73 mL/min/{1.73_m2} (ref >59)
GFR calc non Af Amer: 64 mL/min/{1.73_m2} (ref >59)
Globulin, Total: 2.7 g/dL (ref 1.5–4.5)
Glucose: 85 mg/dL (ref 65–99)
Potassium: 3.8 mmol/L (ref 3.5–5.2)
Sodium: 131 mmol/L — ABNORMAL LOW (ref 134–144)
Total Protein: 7.1 g/dL (ref 6.0–8.5)

## 2018-12-07 LAB — CBC WITH DIFFERENTIAL/PLATELET
Basophils Absolute: 0 10*3/uL (ref 0.0–0.2)
Basos: 1 %
EOS (ABSOLUTE): 0.1 10*3/uL (ref 0.0–0.4)
Eos: 1 %
Hematocrit: 41.2 % (ref 34.0–46.6)
Hemoglobin: 14.1 g/dL (ref 11.1–15.9)
Immature Grans (Abs): 0 10*3/uL (ref 0.0–0.1)
Immature Granulocytes: 0 %
Lymphocytes Absolute: 0.9 10*3/uL (ref 0.7–3.1)
Lymphs: 18 %
MCH: 32.6 pg (ref 26.6–33.0)
MCHC: 34.2 g/dL (ref 31.5–35.7)
MCV: 95 fL (ref 79–97)
Monocytes Absolute: 0.4 10*3/uL (ref 0.1–0.9)
Monocytes: 8 %
Neutrophils Absolute: 3.7 10*3/uL (ref 1.4–7.0)
Neutrophils: 72 %
Platelets: 244 10*3/uL (ref 150–450)
RBC: 4.33 x10E6/uL (ref 3.77–5.28)
RDW: 11.8 % (ref 11.7–15.4)
WBC: 5.1 10*3/uL (ref 3.4–10.8)

## 2018-12-07 LAB — METHYLMALONIC ACID, SERUM: Methylmalonic Acid: 106 nmol/L (ref 0–378)

## 2018-12-07 LAB — TSH: TSH: 0.628 u[IU]/mL (ref 0.450–4.500)

## 2018-12-14 ENCOUNTER — Other Ambulatory Visit: Payer: Self-pay | Admitting: Family Medicine

## 2018-12-14 DIAGNOSIS — I10 Essential (primary) hypertension: Secondary | ICD-10-CM

## 2018-12-16 ENCOUNTER — Ambulatory Visit (INDEPENDENT_AMBULATORY_CARE_PROVIDER_SITE_OTHER): Payer: Medicare HMO | Admitting: Neurology

## 2018-12-16 DIAGNOSIS — R441 Visual hallucinations: Secondary | ICD-10-CM

## 2018-12-16 DIAGNOSIS — G4733 Obstructive sleep apnea (adult) (pediatric): Secondary | ICD-10-CM | POA: Diagnosis not present

## 2018-12-16 DIAGNOSIS — I10 Essential (primary) hypertension: Secondary | ICD-10-CM

## 2018-12-16 DIAGNOSIS — N3281 Overactive bladder: Secondary | ICD-10-CM

## 2018-12-16 DIAGNOSIS — I70213 Atherosclerosis of native arteries of extremities with intermittent claudication, bilateral legs: Secondary | ICD-10-CM

## 2018-12-16 DIAGNOSIS — E785 Hyperlipidemia, unspecified: Secondary | ICD-10-CM

## 2018-12-16 DIAGNOSIS — R351 Nocturia: Secondary | ICD-10-CM

## 2018-12-16 DIAGNOSIS — I517 Cardiomegaly: Secondary | ICD-10-CM

## 2018-12-16 DIAGNOSIS — I739 Peripheral vascular disease, unspecified: Secondary | ICD-10-CM

## 2018-12-16 MED ORDER — SPIRONOLACTONE 25 MG PO TABS: 25.0000 mg | ORAL_TABLET | Freq: Every day | ORAL | 0 refills | Status: DC

## 2018-12-16 NOTE — Telephone Encounter (Signed)
Pts daughter called and stated that she needs this medication refilled. She stated that she is out of this medication and wanted to see if she can get an emergency refill sent to the [facility] on [address]. Until the new prescription is sent to [facility] mail delivery

## 2018-12-17 ENCOUNTER — Other Ambulatory Visit: Payer: Self-pay | Admitting: Family Medicine

## 2018-12-17 NOTE — Telephone Encounter (Signed)
[facility] is requesting to fill pt 90 supply of pt spironolactone. Please advise [facility]

## 2018-12-24 DIAGNOSIS — F419 Anxiety disorder, unspecified: Secondary | ICD-10-CM | POA: Diagnosis not present

## 2018-12-25 ENCOUNTER — Ambulatory Visit
Admission: RE | Admit: 2018-12-25 | Discharge: 2018-12-25 | Disposition: A | Discharge status: Home / Self Care | Payer: Medicare HMO | Source: Ambulatory Visit | Attending: Neurology | Admitting: Neurology

## 2018-12-25 ENCOUNTER — Other Ambulatory Visit: Payer: Self-pay

## 2018-12-25 DIAGNOSIS — G309 Alzheimer's disease, unspecified: Secondary | ICD-10-CM | POA: Diagnosis not present

## 2018-12-25 MED ORDER — GADOBENATE DIMEGLUMINE 529 MG/ML IV SOLN
12.0000 mL | Freq: Once | INTRAVENOUS | Status: AC | PRN
  Administered 2018-12-25: 12 mL via INTRAVENOUS

## 2018-12-30 ENCOUNTER — Telehealth: Payer: Self-pay

## 2018-12-30 NOTE — Telephone Encounter (Signed)
Notes recorded by Marval Regal, RN on 12/30/2018 at 1:51 PM EDT  I called Janette pts daughter about her moms MRI brain. I stated MRI brain (with and without) demonstrating:  -Moderate atrophy.  -Mild chronic small vessel ischemic disease.  -No acute findings.  The daughter verbalized understanding.

## 2018-12-31 ENCOUNTER — Telehealth: Payer: Self-pay | Admitting: Neurology

## 2018-12-31 DIAGNOSIS — R441 Visual hallucinations: Secondary | ICD-10-CM | POA: Insufficient documentation

## 2018-12-31 DIAGNOSIS — G4733 Obstructive sleep apnea (adult) (pediatric): Secondary | ICD-10-CM | POA: Insufficient documentation

## 2018-12-31 DIAGNOSIS — R351 Nocturia: Secondary | ICD-10-CM | POA: Insufficient documentation

## 2018-12-31 NOTE — Telephone Encounter (Signed)
-----   Message from Larey Seat, MD sent at 12/31/2018 12:45 PM EDT -----  Summary & Diagnosis:   Mild Obstructive sleep apnea was identified with an AHI of 14/h and exacerbated to REM-AHI of 23.2/h.  Higher RDI is suggestive of snoring, related to UARS. No clinically significant oxygen desaturation. The heart rate is bradycardic, intermittently.   Recommendations:     This mild apnea can be treated with CPAP rather than dental device as REM dependent apneas do not respond well to mandibular advancement. If treatment of OSA will improve this patient's memory is not sure, but we should follow the patient after a 90 day course on auto CPAP with new Epworth score and symptom review, MMSE/ MOCA.

## 2018-12-31 NOTE — Addendum Note (Signed)
Addended by: Larey Seat on: 12/31/2018 12:45 PM   Modules accepted: Orders

## 2018-12-31 NOTE — Telephone Encounter (Signed)
I called pt. I advised pt that Dr. Brett Fairy reviewed their sleep study results and found that pt has mild sleep apnea. Dr. Brett Fairy recommends that pt starts auto CPAP. I reviewed PAP compliance expectations with the pt. Pt is agreeable to starting a CPAP. I advised pt that an order will be sent to a DME, [facility], and [facility] will call the pt within about one week after they file with the pt's insurance. [facility] will show the pt how to use the machine, fit for masks, and troubleshoot the CPAP if needed. A follow up appt was made for insurance purposes with Dr Brett Fairy on 1:30 pm. Pt verbalized understanding to arrive 15 minutes early and bring their CPAP. A letter with all of this information in it will be mailed to the pt as a reminder. I verified with the pt that the address we have on file is correct. Pt verbalized understanding of results. Pt had no questions at this time but was encouraged to call back if questions arise. I have sent the order to [facility] and have received confirmation that they have received the order.

## 2018-12-31 NOTE — Procedures (Signed)
Patient Information     First Name: [name] Poggioli Last Name: [name] ID: [id number]  Birth Date: Jul 03, 1931 Age: 85 Gender: Female  Referring Provider: Denita Lung, MD, FP BMI: 26.4 (W=134 lb, H=5' 0'')  Neck Circ.:  14 '' Epworth:  15/24   Sleep Study Information    Study Date: Dec 16, 2018 S/H/A Version: 001.001.001.001 / 4.1.1528 / 77  History:    [name] [name] is a right-handed [place] or [place] American female seen on 12-03-2018. She has a past medical history of Allergy, Arthritis, Asthma, Cataract, Diverticulosis, Dyslipidemia, GERD (gastroesophageal reflux disease), Hemorrhoids, Atrial fibrillation (not current), Cardiomegaly,  Hypertension, Menopause, Obesity, and PVD (peripheral vascular disease) ([place]), Temporal Arteritis in 2004, diagnosed by biopsy. The patient never had a sleep study according to patient's records- but she thinks she may have had a sleep study in [facility], over 15 years ago.  She suffers from excessive daytime sleepiness and vivid dreams that are similar to hallucinations. She also reports memory loss. Now beginning to have visual hallucinations in daytime, no auditory component.       Summary & Diagnosis:    Valid sleep time of over 7 hours-  Mild Obstructive sleep apnea was identified with an AHI of 14/h and exacerbated to REM-AHI of 23.2/h.  Higher RDI is suggestive of snoring, related to UARS. No clinically significant oxygen desaturation. The heart rate is bradycardic, intermittently.   Recommendations:      This mild apnea can be treated with CPAP rather than dental device as REM dependent apneas do not respond well to mandibular advancement. If treatment of OSA will improve this patient's memory is not sure, but we should follow the patient after a 90 day course on auto CPAP with new Epworth score and symptom review, MMSE/ MOCA. Physician Name: Larey Seat, MD            Sleep Summary  Oxygen Saturation Statistics   Start Study Time: End Study  Time: Total Recording Time:  8:43:19 PM 6:47:20 AM    10 h, 4 min  Total Sleep Time % REM of Sleep Time:  8 h, 14 min 17.0    Mean: 96 Minimum: 71 Maximum: 100  Mean of Desaturations Nadirs (%):   92  Oxygen Desaturation. %:   4-9 10-20 >20 Total  Events Number Total    18  4 78.3 17.4  1 4.3  23 100.0  Oxygen Saturation: <90 <=88 <85 <80 <70  Duration (minutes): Sleep % 0.1 0.0  0.1 0.1  0.0 0.0 0.0 0.0 0.0 0.0     Respiratory Indices      Total Events REM NREM All Night  RDI:  174  AHI:  111 ODI:  23  AHIc:3    CSR: 0.0 34.1 23.2 7.0 0.9 19.7 12.3 2.1 0.3 22.0 14.0 2.9 0.4       Pulse Rate Statistics during Sleep (BPM)      Mean: 53 Minimum: N/A Maximum: 85    Indices are calculated using technically valid sleep time of  7 h, 54 min. Central-Indices are calculated using technically valid sleep time of  7  h 7 min. pRDI/pAHI are calculated using 02 desaturations ? 3%  Body Position Statistics  Position Supine Prone Right Left Non-Supine  Sleep (min) 492.5 1.0 1.0 0.0 2.0  Sleep % 99.6 0.2 0.2 0.0 0.4  pRDI 21.9 N/A N/A N/A N/A  pAHI 13.9 N/A N/A N/A N/A  ODI 2.8 N/A N/A N/A N/A  Snoring Statistics Snoring Level (dB) >40 >50 >60 >70 >80 >Threshold (45)  Sleep (min) 160.0 9.8 5.2 1.8 0.0 21.7  Sleep % 32.4 2.0 1.0 0.4 0.0 4.4    Mean: 41

## 2019-01-16 DIAGNOSIS — G4733 Obstructive sleep apnea (adult) (pediatric): Secondary | ICD-10-CM | POA: Diagnosis not present

## 2019-02-15 DIAGNOSIS — G4733 Obstructive sleep apnea (adult) (pediatric): Secondary | ICD-10-CM | POA: Diagnosis not present

## 2019-03-11 ENCOUNTER — Ambulatory Visit: Payer: Medicare HMO | Admitting: Neurology

## 2019-03-11 ENCOUNTER — Other Ambulatory Visit: Payer: Self-pay

## 2019-03-11 ENCOUNTER — Telehealth: Payer: Self-pay

## 2019-03-11 NOTE — Telephone Encounter (Signed)
Unable to get in contact with the patient. LVM letting the patient know that we will need to convert her appt to a mychart video visit or a telephone visit with [facility]. Office number was provided.   If patient calls back please convert her appt into mychart video visit or a telephone visit.

## 2019-03-11 NOTE — Telephone Encounter (Signed)
The EPIC comment next to her appointment stated : "patient left " ?  Was she physically there?

## 2019-03-11 NOTE — Telephone Encounter (Signed)
Yes she didn't listen to the voicemail I left her this morning when I tried to convert her appt into a mychart video visit. She has been r/s for 03/31/2019 with you for her initial cpap appt.

## 2019-03-11 NOTE — Progress Notes (Deleted)
I called the given HOME PHONE number , but was unable to contact the patient, no voicemail is set up. No e-mail listed. Larey Seat, MD

## 2019-03-18 DIAGNOSIS — G4733 Obstructive sleep apnea (adult) (pediatric): Secondary | ICD-10-CM | POA: Diagnosis not present

## 2019-03-31 ENCOUNTER — Other Ambulatory Visit: Payer: Self-pay

## 2019-03-31 ENCOUNTER — Ambulatory Visit: Payer: Medicare HMO | Admitting: Neurology

## 2019-03-31 ENCOUNTER — Encounter: Payer: Self-pay | Admitting: Neurology

## 2019-03-31 VITALS — BP 120/72 | HR 72 | Temp 97.5°F | Ht 60.0 in | Wt 134.0 lb

## 2019-03-31 DIAGNOSIS — R351 Nocturia: Secondary | ICD-10-CM

## 2019-03-31 DIAGNOSIS — G4733 Obstructive sleep apnea (adult) (pediatric): Secondary | ICD-10-CM

## 2019-03-31 DIAGNOSIS — I517 Cardiomegaly: Secondary | ICD-10-CM | POA: Diagnosis not present

## 2019-03-31 DIAGNOSIS — R441 Visual hallucinations: Secondary | ICD-10-CM

## 2019-03-31 DIAGNOSIS — G309 Alzheimer's disease, unspecified: Secondary | ICD-10-CM | POA: Diagnosis not present

## 2019-03-31 DIAGNOSIS — I739 Peripheral vascular disease, unspecified: Secondary | ICD-10-CM

## 2019-03-31 MED ORDER — DONEPEZIL HCL 5 MG PO TABS: 5.0000 mg | ORAL_TABLET | Freq: Every morning | ORAL | 0 refills | Status: DC

## 2019-03-31 MED ORDER — DONEPEZIL HCL 10 MG PO TABS: ORAL_TABLET | ORAL | 3 refills | Status: DC

## 2019-03-31 NOTE — Progress Notes (Signed)
SLEEP MEDICINE CLINIC    Provider:  Larey Seat, MD  Primary Care Physician:  Denita Lung, [place] [address]     Referring Provider: Denita Lung, [place],  [place] [address]          Chief Complaint according to patient   Patient presents with:    . New Patient (Initial Visit)     she is having dreams and she wakes up hallucinations and its causing her to feel its real during the day.. she has the halllucinations throughout the day. depending on the activity during the day it can cause the hallucinations to be better or worse. they state no concerns with memory itself.       HISTORY OF PRESENT ILLNESS:  [name] [name] is a 86 y.o. year old African American female patient seen on 03/31/2019. [name] [name] is seen here today in the presence of her family member, she arrived with a seated walker, she had undergone a sleep study on December 16, 2018 which showed mild apnea but REM dependent apnea.  Her AHI was 14 and during REM sleep 23.2/h her RDI was 34.1/h during REM sleep indicating loud snoring.  The patient was focused with a auto titration CPAP with a pressure window between 6 and 16 cmH2O 3 cm EPR and did well for the month of December she used the machine 100 AutoSet is 30 out of 30 days 100% and 97% by time with an average use at time of 5 hours and 46 minutes.  The residual AHI was 5.1 without central apneas emerging, the 95th percentile pressure was 10.7 cm water.  There were few central apneas emerging, however [name] tells me today that she has not continued to use the machine she has not used the CPAP in the new year 2021.  Additionally,  we are also looking at her memory which seems to be affected by a progressive dementia she scored 15 out of 30 points in a Mini-Mental status examination today.  She underwent an MRI brain study on 25 December 2018 also ordered by my self for memory loss evaluation it showed  moderate atrophy of the brain chronic small vessel disease and no acute injuries tumors or bleed.  This would also be an age-related finding but can be related to dementia.  Especially brain atrophy at the temporal sulci is typical for memory loss disorders.  I agree that it is not likely for [name] [name] to continue CPAP therapy on er own, and her memory loss will make compliance difficult.  Her daughter reports fighting about the CPAP.      Originally referred by [facility] for a problem with hallucinations, sun- downing, sleep disturbance. .  Chief concern according to patient's daughter, [name] : " we need some sleep at night, me, my husband and my mother-"  I have the pleasure of seeing [name] [name] today, a right-handed [place] or [place] American female with a possible sleep disorder.   She  has a past medical history of Allergy, Arthritis, Asthma, Cataract, Diverticulosis, Dyslipidemia, GERD (gastroesophageal reflux disease), Hemorrhoids, Atrial fibrillation ( not current)  Hypertension, Menopause, Obesity, and PVD (peripheral vascular disease) ([place]), Temporal Arteritis 2004, diagnosed by biopsy.  The patient never had a sleep study , according to patient- she thinks she may have had a sleep study in [place], over 15 years ago. .    Sleep relevant medical history: Nocturia- up to  4 times(!) Sleep walking episodic, Night terrors- none, would get confused after bathroom visit.  other Parasomnia , collarbone fracture.  Family medical /sleep history: no  other family member on CPAP with OSA, insomnia, no other sleep walkers.    Social history:  Patient is retired from a Corporate treasurer- and lives in a household with 3 persons/daughter and son-in Sports coach.  Family status is widowed.one dog in the home.  Tobacco use: none .  ETOH use; none ,  Caffeine intake in form of Coffee( used to ) Soda( 1/ week) Tea (uncaffeineated herbal) no energy drinks. No regular exercise .  Had bunion surgery- affecting  walking.    Sleep habits are as follows: The patient's dinner time is between 6 PM. The patient goes to bed at  8.30 -9.30 PM and only sometimes trouble to sleep. She continues to sleep for 2 hours, wakes for many bathroom breaks, the first time at midnight AM.  Roaming the house. Talking to unseen people- whispering.  The preferred sleep position is supine,  with the support of 2-3 pillows. Dreams are reportedly frequent/vivid.  Very fragmented.  The people she sees never speak- but she is o convinced these are real. Since Novemebr more in daytime. 7-8.30 AM is the usual rise time.  The patient wakes up spontaneously.  She reports not feeling refreshed or restored in AM, with symptoms such as dry mouth, but not morning headaches, and residual fatigue. Naps are taken frequently, unscheduled. , lasting from 20 to 45 minutes in front of the TV.    Review of Systems: Out of a complete 14 system review, the patient complains of only the following symptoms, and all other reviewed systems are negative.:  Fatigue, sleepiness , snoring, nocturia.  fragmented sleep, Insomnia- lack of routines.    How likely are you to doze in the following situations: 0 = not likely, 1 = slight chance, 2 = moderate chance, 3 = high chance   Sitting and Reading?2 Watching Television? 3 Sitting inactive in a public place (theater or meeting)?1 As a passenger in a car for an hour without a break? 3 Lying down in the afternoon when circumstances permit? 3 Sitting and talking to someone?1 Sitting quietly after lunch without alcohol?2 In a car, while stopped for a few minutes in traffic?0   Total = 14-15/ 24 points   FSS endorsed at 28/ 63 points.   MMSE - Mini Mental State Exam 03/31/2019 12/03/2018 01/29/2018  Orientation to time 1 5 5   Orientation to Place 3 5 5   Registration 3 3 3   Attention/ Calculation 1 1 5   Recall 0 0 2  Language- name 2 objects 2 2 2   Language- repeat 1 1 1   Language- follow 3 step  command 3 3 3   Language- read & follow direction 1 1 1   Write a sentence 1 1 1   Copy design 0 0 1  Total score 16 22 29     Easily distracted, unfocussed, logorrhea.   Social History   Socioeconomic History  . Marital status: Widowed    Spouse name: Not on file  . Number of children: Not on file  . Years of education: Not on file  . Highest education level: Not on file  Occupational History  . Not on file  Tobacco Use  . Smoking status: Never Smoker  . Smokeless tobacco: Never Used  Substance and Sexual Activity  . Alcohol use: No  . Drug use: No  . Sexual activity: Not Currently  Other Topics Concern  . Not on file  Social History Narrative   Lives at home with husband, exercise some with water aerobics 2 days per week, has 3 children, 6 grandchildren, 2 great grandchildren   Social Determinants of Health   Financial Resource Strain:   . Difficulty of Paying Living Expenses: Not on file  Food Insecurity:   . Worried About Charity fundraiser in the Last Year: Not on file  . Ran Out of Food in the Last Year: Not on file  Transportation Needs:   . Lack of Transportation (Medical): Not on file  . Lack of Transportation (Non-Medical): Not on file  Physical Activity:   . Days of Exercise per Week: Not on file  . Minutes of Exercise per Session: Not on file  Stress:   . Feeling of Stress : Not on file  Social Connections:   . Frequency of Communication with Friends and Family: Not on file  . Frequency of Social Gatherings with Friends and Family: Not on file  . Attends Religious Services: Not on file  . Active Member of Clubs or Organizations: Not on file  . Attends Archivist Meetings: Not on file  . Marital Status: Not on file    Family History  Problem Relation Age of Onset  . Cancer Sister   . Mental retardation Paternal Aunt   . Heart disease Son        died age 41yo  . Hyperlipidemia Son   . Heart attack Son     Past Medical History:   Diagnosis Date  . Allergy   . Arthritis   . Asthma   . Cataract   . Diverticulosis   . Dyslipidemia   . GERD (gastroesophageal reflux disease)   . Hemorrhoids   . History of atrial fibrillation   . Hypertension   . Menopause   . Obesity   . PVD (peripheral vascular disease) ([place])     Past Surgical History:  Procedure Laterality Date  . ABDOMINAL AORTAGRAM N/A 02/09/2012   Procedure: ABDOMINAL Maxcine Ham;  Surgeon: Elam Dutch, MD;  Location: [facility] CATH LAB;  Service: Cardiovascular;  Laterality: N/A;  . COLONOSCOPY  02/2004  . EYE SURGERY     CATARACT (BILATERAL)  . HERNIA REPAIR     abdominal x 2  . right femoral to anterior tibial bypass  2004  . SHOULDER SURGERY     age 58 due to dislocation  . TEMPORAL ARTERY BIOPSY / LIGATION       Current Outpatient Medications on File Prior to Visit  Medication Sig Dispense Refill  . acetaminophen (TYLENOL) 650 MG CR tablet Take 650-1,300 mg by mouth every 8 (eight) hours as needed (arthritis pain).    [name] aspirin EC 81 MG tablet Take 81 mg by mouth daily.    [name] atenolol-chlorthalidone (TENORETIC) 100-25 MG tablet Take 1 tablet by mouth daily. 90 tablet 3  . Glucos-MSM-C-Mn-Ginger-Willow (GLUCOSAMINE MSM COMPLEX PO) Take 1 tablet by mouth daily.     . Multiple Vitamin (MULITIVITAMIN WITH MINERALS) TABS Take 1 tablet by mouth daily.      [name] OVER THE COUNTER MEDICATION Take 500 mg by mouth daily. Beet Root    . OVER THE COUNTER MEDICATION Take 1 capsule by mouth daily. Omega XL    . Polyethyl Glycol-Propyl Glycol (SYSTANE) 0.4-0.3 % SOLN Place 1 drop into both eyes daily.    . simvastatin (ZOCOR) 40 MG tablet Take 1 tablet (40 mg total) by mouth  at bedtime. 90 tablet 1  . sodium chloride (OCEAN) 0.65 % SOLN nasal spray Place 1 spray into both nostrils as needed for congestion.    [name] spironolactone (ALDACTONE) 25 MG tablet TAKE 1 TABLET EVERY DAY 90 tablet 3  . spironolactone (ALDACTONE) 25 MG tablet Take 1 tablet (25 mg total) by mouth  daily. 30 tablet 0  . spironolactone (ALDACTONE) 25 MG tablet TAKE 1 TABLET BY MOUTH DAILY 90 tablet 1  . sulfamethoxazole-trimethoprim (BACTRIM DS) 800-160 MG tablet Take 1 tablet by mouth 2 (two) times daily. 20 tablet 0  . triamcinolone cream (KENALOG) 0.5 % APPLY TOPICALLY 3 TIMES  DAILY (Patient taking differently: Apply 1 application topically 3 (three) times daily as needed (itching). ) 240 g 3  . Turmeric (QC TUMERIC COMPLEX PO) Take by mouth.     No current facility-administered medications on file prior to visit.    No Known Allergies  Physical exam:  There were no vitals filed for this visit. There is no height or weight on file to calculate BMI.   Wt Readings from Last 3 Encounters:  12/03/18 134 lb (60.8 kg)  09/03/18 153 lb (69.4 kg)  04/02/18 153 lb 9.6 oz (69.7 kg)     Ht Readings from Last 3 Encounters:  12/03/18 5' (1.524 m)  03/12/18 4\' 9"  (1.448 m)  11/14/17 4\' 9"  (1.448 m)    MMSE - Mini Mental State Exam 12/03/2018 01/29/2018  Orientation to time 5 5  Orientation to Place 5 5  Registration 3 3  Attention/ Calculation 1 5  Recall 0 2  Language- name 2 objects 2 2  Language- repeat 1 1  Language- follow 3 step command 3 3  Language- read & follow direction 1 1  Write a sentence 1 1  Copy design 0 1  Total score 22 29      General: The patient is awake, alert and appears not in acute distress. The patient is repeatedly asking me about t a place to stay in" and stated:  " my husband doesn't want me back " . The patient is widowed and lives wit her daughter.   Head: Normocephalic, atraumatic. Neck is supple. Mallampati 5,  neck circumference:14. 5 inches .   Dental status: edentulous.  Cardiovascular:  Regular rate and cardiac rhythm by pulse,  without distended neck veins. Respiratory: Lungs are clear to auscultation.  Skin:  With  evidence of mild ankle edema, no rash. Trunk: The patient's posture is very stooped.    Neurologic exam : The  patient is awake and alert, oriented to place and time.   Memory subjective described as intact.  Attention span & concentration ability appears normal.  Speech is fluent,  without  dysarthria, dysphonia or aphasia.  Mood and affect are worried,    Cranial nerves: no loss of smell or taste reported  Pupils are equal and briskly reactive to light. Funduscopic exam deferred. Extraocular movements in vertical and horizontal planes were intact and without nystagmus. No Diplopia. Visual fields by finger perimetry are intact. Hearing was intact to soft voice and finger rubbing.   Facial sensation intact to fine touch. Facial motor strength is symmetric and tongue and uvula move midline.  Neck ROM : rotation, tilt and flexion extension were normal for age and shoulder shrug was symmetrical.    Motor exam:  Symmetric bulk, tone and ROM.   Normal tone without cog wheeling, symmetric grip strength . Sensory:  Fine touch, pinprick and vibration were tested  and  normal.  Proprioception tested in the upper extremities was normal.  Coordination: Rapid alternating movements in the fingers/hands were of normal speed.  The Finger-to-nose maneuver was intact without evidence of ataxia, dysmetria or tremor. Gait and station: Patient could not rise unassisted from a seated position, walked with a walker- relies on an assistive device. Deep tendon reflexes: in the  upper and lower extremities are deferred .        After spending a total time of 50 minutes face to face and additional time for physical and neurologic examination, review of laboratory studies,  personal review of imaging studies, reports and results of other testing and review of referral information / records as far as provided in visit, I have established the following assessments:  1)  Decreased memory by MMSE, progressed stages of dementia. Adjusted for educational level. She left school in 7th grade.  2) Sleep hallucinations and now visions  of people in the house in daytime, only visual , not auditory- Lewy body?  sje has good and bad days.  3) Nocturia and fragmented sleep- mild  sleep disordered breathing is present- but cannot comply with CPAP use.  4) weight loss - and arthritis, history of giant cell arteritis. 130 pounds, before Covid 153 pounds. No loss of appetite.    My Plan is to proceed with:  1) start Aricept. 2) Return CPAP machine - it didn't work out.  I would like Denita Lung, MD to further follow this patient as I cannot treat her OSA sleep disorder. In short, [name] [name] is presenting with sun downing, and nocturnal activities characterized by talking to invisible people, a symptom that can be attributed to neurodegenerative Processes.    I plan to follow up  through our NP within 2-3 month to see if Aricept has been tolerated. .    Electronically signed by: Larey Seat, MD 03/31/2019 3:26 PM  [facility] Board certified by The [employer] of Sleep Medicine and Diplomate of the [employer] of Sleep Medicine. Board certified In Neurology through the [employer], Fellow of the [employer] of Neurology. Medical Director of [employer].

## 2019-03-31 NOTE — Patient Instructions (Signed)
Donepezil tablets What is this medicine? DONEPEZIL (doe NEP e zil) is used to treat mild to moderate dementia caused by Alzheimer's disease. This medicine may be used for other purposes; ask your health care provider or pharmacist if you have questions. COMMON BRAND NAME(S): Aricept What should I tell my health care provider before I take this medicine? They need to know if you have any of these conditions:  asthma or other lung disease  difficulty passing urine  head injury  heart disease  history of irregular heartbeat  liver disease  seizures (convulsions)  stomach or intestinal disease, ulcers or stomach bleeding  an unusual or allergic reaction to donepezil, other medicines, foods, dyes, or preservatives  pregnant or trying to get pregnant  breast-feeding How should I use this medicine? Take this medicine by mouth with a glass of water. Follow the directions on the prescription label. You may take this medicine with or without food. Take this medicine at regular intervals. This medicine is usually taken before bedtime. Do not take it more often than directed. Continue to take your medicine even if you feel better. Do not stop taking except on your doctor's advice. If you are taking the 23 mg donepezil tablet, swallow it whole; do not cut, crush, or chew it. Talk to your pediatrician regarding the use of this medicine in children. Special care may be needed. Overdosage: If you think you have taken too much of this medicine contact a poison control center or emergency room at once. NOTE: This medicine is only for you. Do not share this medicine with others. What if I miss a dose? If you miss a dose, take it as soon as you can. If it is almost time for your next dose, take only that dose, do not take double or extra doses. What may interact with this medicine? Do not take this medicine with any of the following medications:  certain medicines for fungal infections like  itraconazole, fluconazole, posaconazole, and voriconazole  cisapride  dextromethorphan; quinidine  dronedarone  pimozide  quinidine  thioridazine This medicine may also interact with the following medications:  antihistamines for allergy, cough and cold  atropine  bethanechol  carbamazepine  certain medicines for bladder problems like oxybutynin, tolterodine  certain medicines for Parkinson's disease like benztropine, trihexyphenidyl  certain medicines for stomach problems like dicyclomine, hyoscyamine  certain medicines for travel sickness like scopolamine  dexamethasone  dofetilide  ipratropium  NSAIDs, medicines for pain and inflammation, like ibuprofen or naproxen  other medicines for Alzheimer's disease  other medicines that prolong the QT interval (cause an abnormal heart rhythm)  phenobarbital  phenytoin  rifampin, rifabutin or rifapentine  ziprasidone This list may not describe all possible interactions. Give your health care provider a list of all the medicines, herbs, non-prescription drugs, or dietary supplements you use. Also tell them if you smoke, drink alcohol, or use illegal drugs. Some items may interact with your medicine. What should I watch for while using this medicine? Visit your doctor or health care professional for regular checks on your progress. Check with your doctor or health care professional if your symptoms do not get better or if they get worse. You may get drowsy or dizzy. Do not drive, use machinery, or do anything that needs mental alertness until you know how this drug affects you. What side effects may I notice from receiving this medicine? Side effects that you should report to your doctor or health care professional as soon as possible:    allergic reactions like skin rash, itching or hives, swelling of the face, lips, or tongue  feeling faint or lightheaded, falls  loss of bladder control  seizures  signs and  symptoms of a dangerous change in heartbeat or heart rhythm like chest pain; dizziness; fast or irregular heartbeat; palpitations; feeling faint or lightheaded, falls; breathing problems  signs and symptoms of infection like fever or chills; cough; sore throat; pain or trouble passing urine  signs and symptoms of liver injury like dark yellow or brown urine; general ill feeling or flu-like symptoms; light-colored stools; loss of appetite; nausea; right upper belly pain; unusually weak or tired; yellowing of the eyes or skin  slow heartbeat or palpitations  unusual bleeding or bruising  vomiting Side effects that usually do not require medical attention (report to your doctor or health care professional if they continue or are bothersome):  diarrhea, especially when starting treatment  headache  loss of appetite  muscle cramps  nausea  stomach upset This list may not describe all possible side effects. Call your doctor for medical advice about side effects. You may report side effects to FDA at 1-800-FDA-1088. Where should I keep my medicine? Keep out of reach of children. Store at room temperature between 15 and 30 degrees C (59 and 86 degrees F). Throw away any unused medicine after the expiration date. NOTE: This sheet is a summary. It may not cover all possible information. If you have questions about this medicine, talk to your doctor, pharmacist, or health care provider.  2020 Elsevier/Gold Standard (2018-02-11 10:33:41)  

## 2019-04-02 ENCOUNTER — Other Ambulatory Visit: Payer: Self-pay | Admitting: Neurology

## 2019-04-02 ENCOUNTER — Telehealth: Payer: Self-pay | Admitting: Neurology

## 2019-04-02 MED ORDER — DONEPEZIL HCL 5 MG PO TABS: 5.0000 mg | ORAL_TABLET | Freq: Every morning | ORAL | 0 refills | Status: DC

## 2019-04-02 NOTE — Telephone Encounter (Signed)
[name] [name],Janette(daughter not on DPR) has called stating when she brought pt to last appointment there was mention of a starter pack of a medication, daughter did not get the name of the medication.  Daughter would like a call to know how they will go about getting the medication for pt.  Please call

## 2019-04-02 NOTE — Telephone Encounter (Signed)
Called the patient daughter back and advised that the intial script was sent to [facility]. I have corrected and resent that to the local [facility] pharmacy for the patient. This is only one mth supply sent to [facility] for aricept 5 mg one a day for 30 days. Then the goal is to increase to 10 mg/daily in which this will be provided by [facility] as long as patient tolerates the 5 mg dose. She verbalized understanding

## 2019-04-08 NOTE — Telephone Encounter (Signed)
error 

## 2019-06-10 ENCOUNTER — Other Ambulatory Visit: Payer: Self-pay

## 2019-06-10 ENCOUNTER — Encounter: Payer: Self-pay | Admitting: Family Medicine

## 2019-06-10 ENCOUNTER — Ambulatory Visit (INDEPENDENT_AMBULATORY_CARE_PROVIDER_SITE_OTHER): Payer: Medicare HMO | Admitting: Family Medicine

## 2019-06-10 VITALS — BP 102/68 | HR 60 | Temp 96.6°F | Wt 135.0 lb

## 2019-06-10 DIAGNOSIS — Z7189 Other specified counseling: Secondary | ICD-10-CM

## 2019-06-10 DIAGNOSIS — R441 Visual hallucinations: Secondary | ICD-10-CM | POA: Diagnosis not present

## 2019-06-10 DIAGNOSIS — R351 Nocturia: Secondary | ICD-10-CM

## 2019-06-10 DIAGNOSIS — I10 Essential (primary) hypertension: Secondary | ICD-10-CM | POA: Diagnosis not present

## 2019-06-10 DIAGNOSIS — I70213 Atherosclerosis of native arteries of extremities with intermittent claudication, bilateral legs: Secondary | ICD-10-CM | POA: Diagnosis not present

## 2019-06-10 DIAGNOSIS — I739 Peripheral vascular disease, unspecified: Secondary | ICD-10-CM

## 2019-06-10 DIAGNOSIS — E785 Hyperlipidemia, unspecified: Secondary | ICD-10-CM

## 2019-06-10 DIAGNOSIS — F329 Major depressive disorder, single episode, unspecified: Secondary | ICD-10-CM

## 2019-06-10 DIAGNOSIS — N3281 Overactive bladder: Secondary | ICD-10-CM | POA: Diagnosis not present

## 2019-06-10 DIAGNOSIS — M158 Other polyosteoarthritis: Secondary | ICD-10-CM

## 2019-06-10 DIAGNOSIS — I517 Cardiomegaly: Secondary | ICD-10-CM

## 2019-06-10 DIAGNOSIS — Z0000 Encounter for general adult medical examination without abnormal findings: Secondary | ICD-10-CM | POA: Diagnosis not present

## 2019-06-10 DIAGNOSIS — G4733 Obstructive sleep apnea (adult) (pediatric): Secondary | ICD-10-CM

## 2019-06-10 MED ORDER — SPIRONOLACTONE 25 MG PO TABS: 25.0000 mg | ORAL_TABLET | Freq: Every day | ORAL | 3 refills | Status: DC

## 2019-06-10 MED ORDER — ATENOLOL-CHLORTHALIDONE 100-25 MG PO TABS: 1.0000 | ORAL_TABLET | Freq: Every day | ORAL | 3 refills | Status: DC

## 2019-06-10 MED ORDER — SIMVASTATIN 40 MG PO TABS: 40.0000 mg | ORAL_TABLET | Freq: Every day | ORAL | 1 refills | Status: DC

## 2019-06-10 MED ORDER — TRIAMCINOLONE ACETONIDE 0.5 % EX CREA: TOPICAL_CREAM | CUTANEOUS | 3 refills | Status: AC

## 2019-06-10 NOTE — Progress Notes (Signed)
[name] [name] is a 86 y.o. female who presents for annual wellness visit and follow-up on chronic medical conditions.  She is now living with her daughter and this has created some stress between them.  She does feel depressed.  Her daughter has her on St. John's wort which apparently has had some benefit.  She apparently still does have visual hallucinations on occasion.  She does have OSA but does not want to use the CPAP.  She also has a history of OAB and is using a pad.  This also helps her with her nocturia.  She does have a history of PVD with previous vascular surgery.  She is very inactive and has had no complaints of leg pains.  She also has a history of arthritis but is again having less difficulty with her decreased mobility.  She continues on simvastatin without difficulty.  She is also taking spironolactone and Tenoretic and having no difficulty with that.  She has stopped taking her Aricept.  She would like a refill on her triamcinolone which apparently does help with skin lesions.  She also has a history of cardiomegaly but no chest pain, shortness of breath, PND or DOE.  Immunizations and Health Maintenance Immunization History  Administered Date(s) Administered  . Influenza Split 11/20/2011, 01/02/2013  . Influenza Whole 12/02/2007, 12/14/2008, 12/23/2010  . Influenza, High Dose Seasonal PF 11/27/2013, 12/20/2016, 12/26/2017, 12/02/2018  . Influenza-Unspecified 01/02/2014, 12/26/2017  . Pneumococcal Conjugate-13 12/06/2004  . Pneumococcal Polysaccharide-23 02/25/2013  . Td 12/06/2004  . Zoster 08/30/2009  . Zoster Recombinat (Shingrix) 12/20/2016   Health Maintenance Due  Topic Date Due  . TETANUS/TDAP  12/07/2014    Last Pap smear: aged out  Last mammogram: aged out 08-01-11 Last colonoscopy: 02/2004 Last DEXA: 08/01/11 Dentist: over one year Ophtho: over one year Exercise: chair exercise and walking  Other doctors caring for patient include: Dr. Brett Fairy Neurology , Dr.  Gwenlyn Found cardiology,  Advanced directives: not on file info given    Depression screen:  See questionnaire below.  Depression screen [facility] 2/9 06/10/2019 01/29/2018 09/21/2016 09/21/2016 04/05/2015  Decreased Interest 0 2 0 0 0  Down, Depressed, Hopeless 0 2 0 0 0  PHQ - 2 Score 0 4 0 0 0  Altered sleeping - 1 - - -  Tired, decreased energy - 2 - - -  Change in appetite - 2 - - -  Feeling bad or failure about yourself  - 0 - - -  Trouble concentrating - 2 - - -  Moving slowly or fidgety/restless - 1 - - -  PHQ-9 Score - 12 - - -  Difficult doing work/chores - Somewhat difficult - - -    Fall Risk Screen: see questionnaire below. Fall Risk  06/10/2019 12/03/2018 09/21/2016 09/21/2016 04/05/2015  Falls in the past year? 1 0 No No No  Number falls in past yr: 1 - - - -  Injury with Fall? 0 - - - -  Risk for fall due to : - - - - -    ADL screen:  See questionnaire below Functional Status Survey: Is the patient deaf or have difficulty hearing?: No Does the patient have difficulty seeing, even when wearing glasses/contacts?: No Does the patient have difficulty concentrating, remembering, or making decisions?: Yes(just  remebering) Does the patient have difficulty walking or climbing stairs?: Yes Does the patient have difficulty dressing or bathing?: No Does the patient have difficulty doing errands alone such as visiting a doctor's office or shopping?:  Yes   Review of Systems Constitutional: -, -unexpected weight change, -anorexia, -fatigue Allergy: -sneezing, -itching, -congestion Dermatology: denies changing moles, rash, lumps ENT: -runny nose, -ear pain, -sore throat,  Cardiology:  -chest pain, -palpitations, -orthopnea, Respiratory: -cough, -shortness of breath, -dyspnea on exertion, -wheezing,  Gastroenterology: -abdominal pain, -nausea, -vomiting, -diarrhea, -constipation, -dysphagia Hematology: -bleeding or bruising problems Musculoskeletal: -arthralgias, -myalgias, -joint swelling,  -back pain, - Ophthalmology: -vision changes,  Urology: -dysuria, -difficulty urinating,  -urinary frequency, -urgency, incontinence Neurology: -, -numbness, , -memory loss, -falls, -dizziness    PHYSICAL EXAM:  BP 102/68   Pulse 60   Temp (!) 96.6 F (35.9 C)   Wt 135 lb (61.2 kg)   SpO2 98%   BMI 26.37 kg/m   General Appearance: Alert, cooperative, no distress, appears stated age Head: Normocephalic, without obvious abnormality, atraumatic Eyes: PERRL, conjunctiva/corneas clear, EOM's intact, fundi benign Ears: Normal TM's and external ear canals Nose: Nares normal, mucosa normal, no drainage or sinus tenderness Throat: Lips, mucosa, and tongue normal; teeth and gums normal Neck: Supple, no lymphadenopathy;  thyroid:  no enlargement/tenderness/nodules; no carotid bruit or JVD Lungs: Clear to auscultation bilaterally without wheezes, rales or ronchi; respirations unlabored Heart: Regular rate and rhythm, S1 and S2 normal, no murmur, rubor gallop Abdomen: Soft, non-tender, nondistended, normoactive bowel sounds,  no masses, no hepatosplenomegaly Extremities: No clubbing, cyanosis or edema Pulses: 2+ and symmetric all extremities Skin:  Skin color, texture, turgor normal, no rashes or lesions Lymph nodes: Cervical, supraclavicular, and axillary nodes normal Neurologic:  CNII-XII intact, normal strength, sensation and gait; reflexes 2+ and symmetric throughout Psych: Normal mood, affect, hygiene and grooming.  ASSESSMENT/PLAN: Routine general medical examination at a health care facility - Plan: CBC with Differential/Platelet, Comprehensive metabolic panel, Lipid panel  Episodes of formed visual hallucinations: No intervention needed at this time. Mild obstructive sleep apnea-hypopnea syndrome: She is not interested in CPAP.  Essential hypertension, benign - Plan: atenolol-chlorthalidone (TENORETIC) 100-25 MG tablet, spironolactone (ALDACTONE) 25 MG tablet, CBC with  Differential/Platelet, Comprehensive metabolic panel  Hyperlipidemia, unspecified hyperlipidemia type - Plan: simvastatin (ZOCOR) 40 MG tablet, Lipid panel  PVD (peripheral vascular disease) ([place]): No intervention needed at this time.  Other osteoarthritis involving multiple joints  OAB (overactive bladder): Continue to use pads.  Nocturia: Continue to use pads.  Atherosclerosis of native artery of both lower extremities with intermittent claudication ([place])  Cardiomegaly  Counseled about COVID-19 virus infection: Discussed Covid in regard to immunizations gave all the pros and cons and strongly encouraged she and her daughter to get the shot.  Depression, reactive: Continue on St. John's wort.   Medicare Attestation I have personally reviewed: The patient's medical and social history Their use of alcohol, tobacco or illicit drugs Their current medications and supplements The patient's functional ability including ADLs,fall risks, home safety risks, cognitive, and hearing and visual impairment Diet and physical activities Evidence for depression or mood disorders  The patient's weight, height, and BMI have been recorded in the chart.  I have made referrals, counseling, and provided education to the patient based on review of the above and I have provided the patient with a written personalized care plan for preventive services.     Jill Alexanders, MD   06/10/2019

## 2019-06-10 NOTE — Patient Instructions (Signed)
  Ms. Buggs , Thank you for taking time to come for your Medicare Wellness Visit. I appreciate your ongoing commitment to your health goals. Please review the following plan we discussed and let me know if I can assist you in the future.   These are the goals we discussed: Continue on present medications including St. [place]re[place] Delahoussaye's wort.  We will also have home health come make a visit to see what else can be done to help with general care. This is a list of the screening recommended for you and due dates:  Health Maintenance  Topic Date Due  . Tetanus Vaccine  12/07/2014  . Flu Shot  10/05/2019  . DEXA scan (bone density measurement)  Completed  . Pneumonia vaccines  Completed

## 2019-06-10 NOTE — Addendum Note (Signed)
Addended by: Elyse Jarvis on: 06/10/2019 01:08 PM   Modules accepted: Orders

## 2019-06-11 LAB — CBC WITH DIFFERENTIAL/PLATELET
Basophils Absolute: 0.1 10*3/uL (ref 0.0–0.2)
Basos: 1 %
EOS (ABSOLUTE): 0.1 10*3/uL (ref 0.0–0.4)
Eos: 1 %
Hematocrit: 39.4 % (ref 34.0–46.6)
Hemoglobin: 13.1 g/dL (ref 11.1–15.9)
Immature Grans (Abs): 0 10*3/uL (ref 0.0–0.1)
Immature Granulocytes: 0 %
Lymphocytes Absolute: 0.9 10*3/uL (ref 0.7–3.1)
Lymphs: 17 %
MCH: 33.4 pg — ABNORMAL HIGH (ref 26.6–33.0)
MCHC: 33.2 g/dL (ref 31.5–35.7)
MCV: 101 fL — ABNORMAL HIGH (ref 79–97)
Monocytes Absolute: 0.6 10*3/uL (ref 0.1–0.9)
Monocytes: 12 %
Neutrophils Absolute: 3.7 10*3/uL (ref 1.4–7.0)
Neutrophils: 69 %
Platelets: 207 10*3/uL (ref 150–450)
RBC: 3.92 x10E6/uL (ref 3.77–5.28)
RDW: 11.4 % — ABNORMAL LOW (ref 11.7–15.4)
WBC: 5.4 10*3/uL (ref 3.4–10.8)

## 2019-06-11 LAB — COMPREHENSIVE METABOLIC PANEL
ALT: 10 IU/L (ref 0–32)
AST: 19 IU/L (ref 0–40)
Albumin/Globulin Ratio: 1.8 (ref 1.2–2.2)
Albumin: 4.2 g/dL (ref 3.6–4.6)
Alkaline Phosphatase: 80 IU/L (ref 39–117)
BUN/Creatinine Ratio: 19 (ref 12–28)
BUN: 15 mg/dL (ref 8–27)
Bilirubin Total: 0.7 mg/dL (ref 0.0–1.2)
CO2: 24 mmol/L (ref 20–29)
Calcium: 9.7 mg/dL (ref 8.7–10.3)
Chloride: 94 mmol/L — ABNORMAL LOW (ref 96–106)
Creatinine, Ser: 0.78 mg/dL (ref 0.57–1.00)
GFR calc Af Amer: 80 mL/min/{1.73_m2} (ref >59)
GFR calc non Af Amer: 70 mL/min/{1.73_m2} (ref >59)
Globulin, Total: 2.3 g/dL (ref 1.5–4.5)
Glucose: 87 mg/dL (ref 65–99)
Potassium: 4 mmol/L (ref 3.5–5.2)
Sodium: 131 mmol/L — ABNORMAL LOW (ref 134–144)
Total Protein: 6.5 g/dL (ref 6.0–8.5)

## 2019-06-11 LAB — LIPID PANEL
Chol/HDL Ratio: 1.5 ratio (ref 0.0–4.4)
Cholesterol, Total: 92 mg/dL — ABNORMAL LOW (ref 100–199)
HDL: 62 mg/dL (ref >39)
LDL Chol Calc (NIH): 18 mg/dL (ref 0–99)
Triglycerides: 48 mg/dL (ref 0–149)
VLDL Cholesterol Cal: 12 mg/dL (ref 5–40)

## 2019-07-01 ENCOUNTER — Ambulatory Visit (INDEPENDENT_AMBULATORY_CARE_PROVIDER_SITE_OTHER): Payer: Medicare HMO | Admitting: Cardiovascular Disease

## 2019-07-01 ENCOUNTER — Encounter: Payer: Self-pay | Admitting: Cardiovascular Disease

## 2019-07-01 ENCOUNTER — Ambulatory Visit: Payer: Medicare HMO | Admitting: Adult Health

## 2019-07-01 ENCOUNTER — Other Ambulatory Visit: Payer: Self-pay

## 2019-07-01 DIAGNOSIS — I10 Essential (primary) hypertension: Secondary | ICD-10-CM | POA: Diagnosis not present

## 2019-07-01 DIAGNOSIS — E782 Mixed hyperlipidemia: Secondary | ICD-10-CM

## 2019-07-01 NOTE — Assessment & Plan Note (Signed)
History of essential hypertension a blood pressure measured today 120/62.  She is on atenolol, chlorthalidone and spironolactone.

## 2019-07-01 NOTE — Assessment & Plan Note (Signed)
History of hyperlipidemia on statin therapy with lipid profile performed 06/10/2019 revealing a total cholesterol of 92.

## 2019-07-01 NOTE — Progress Notes (Signed)
07/01/2019 [name] [name]   07/01/1931  [id number]  Primary Physician Denita Lung, MD Primary Cardiologist: Lorretta Harp MD FACP, [place], [place], [place]  HPI:  [name] [name] is a 86 y.o.  moderately overweight widowed African-American female whose husband of 68 years died Jul 14, 2017.  She is a mother of 2, grandmother 6 grandchildren.  She is accompanied by her daughter [name] his husband [name] is also a patient of mine.  She was referred by Dr. Redmond School for ongoing cardiovascular evaluation because her previous cardiologist, Dr. Wynonia Lawman, is currently out on sick leave.  I last saw her in the office 03/12/2018. Her risk factors include treated hypertension hyperlipidemia.  She is never had a heart attack or stroke.  She denies chest pain or shortness of breath.  There is no family history of heart disease.  She did lose 130 pounds on her own as result of diet and exercise over 3 years and feels clinically improved.  Since I saw her a year ago she continues to do well.  She walks with a walker.  She denies chest pain or shortness of breath.   Current Meds  Medication Sig  . acetaminophen (TYLENOL) 650 MG CR tablet Take 650-1,300 mg by mouth every 8 (eight) hours as needed (arthritis pain).  [name] aspirin EC 81 MG tablet Take 81 mg by mouth daily.  [name] atenolol-chlorthalidone (TENORETIC) 100-25 MG tablet Take 1 tablet by mouth daily.  . Multiple Vitamin (MULITIVITAMIN WITH MINERALS) TABS Take 1 tablet by mouth daily.    [name] OVER THE COUNTER MEDICATION Take 1 capsule by mouth daily. Omega XL  . simvastatin (ZOCOR) 40 MG tablet Take 1 tablet (40 mg total) by mouth at bedtime.  . sodium chloride (OCEAN) 0.65 % SOLN nasal spray Place 1 spray into both nostrils as needed for congestion.  [name] spironolactone (ALDACTONE) 25 MG tablet Take 1 tablet (25 mg total) by mouth daily.  [name] triamcinolone cream (KENALOG) 0.5 % APPLY TOPICALLY 3 TIMES  DAILY     No Known Allergies  Social History    Socioeconomic History  . Marital status: Widowed    Spouse name: Not on file  . Number of children: Not on file  . Years of education: Not on file  . Highest education level: Not on file  Occupational History  . Not on file  Tobacco Use  . Smoking status: Never Smoker  . Smokeless tobacco: Never Used  Substance and Sexual Activity  . Alcohol use: No  . Drug use: No  . Sexual activity: Not Currently  Other Topics Concern  . Not on file  Social History Narrative   Lives at home with husband, exercise some with water aerobics 2 days per week, has 3 children, 6 grandchildren, 2 great grandchildren   Social Determinants of Health   Financial Resource Strain:   . Difficulty of Paying Living Expenses:   Food Insecurity:   . Worried About Charity fundraiser in the Last Year:   . Arboriculturist in the Last Year:   Transportation Needs:   . Film/video editor (Medical):   [name] Lack of Transportation (Non-Medical):   Physical Activity:   . Days of Exercise per Week:   . Minutes of Exercise per Session:   Stress:   . Feeling of Stress :   Social Connections:   . Frequency of Communication with Friends and Family:   . Frequency of Social Gatherings with Friends and  Family:   . Attends Religious Services:   . Active Member of Clubs or Organizations:   . Attends Archivist Meetings:   [name] Marital Status:   Intimate Partner Violence:   . Fear of Current or Ex-Partner:   . Emotionally Abused:   [name] Physically Abused:   . Sexually Abused:      Review of Systems: General: negative for chills, fever, night sweats or weight changes.  Cardiovascular: negative for chest pain, dyspnea on exertion, edema, orthopnea, palpitations, paroxysmal nocturnal dyspnea or shortness of breath Dermatological: negative for rash Respiratory: negative for cough or wheezing Urologic: negative for hematuria Abdominal: negative for nausea, vomiting, diarrhea, bright red blood per rectum,  melena, or hematemesis Neurologic: negative for visual changes, syncope, or dizziness All other systems reviewed and are otherwise negative except as noted above.    Blood pressure 120/62, height 5' (1.524 m), weight 142 lb (64.4 kg).  General appearance: alert and no distress Neck: no adenopathy, no carotid bruit, no JVD, supple, symmetrical, trachea midline and thyroid not enlarged, symmetric, no tenderness/mass/nodules Lungs: clear to auscultation bilaterally Heart: regular rate and rhythm, S1, S2 normal, no murmur, click, rub or gallop Extremities: extremities normal, atraumatic, no cyanosis or edema Pulses: 2+ and symmetric Skin: Skin color, texture, turgor normal. No rashes or lesions Neurologic: Alert and oriented X 3, normal strength and tone. Normal symmetric reflexes. Normal coordination and gait  EKG sinus bradycardia 54 with occasional PACs borderline LVH.  I personally reviewed this EKG.  ASSESSMENT AND PLAN:   Hyperlipidemia History of hyperlipidemia on statin therapy with lipid profile performed 06/10/2019 revealing a total cholesterol of 92.  Essential hypertension, benign History of essential hypertension a blood pressure measured today 120/62.  She is on atenolol, chlorthalidone and spironolactone.      Lorretta Harp MD FACP,FACC,FAHA, [place] 07/01/2019 12:00 PM

## 2019-07-01 NOTE — Patient Instructions (Signed)

## 2019-09-04 ENCOUNTER — Encounter: Payer: Self-pay | Admitting: Adult Health

## 2019-09-04 ENCOUNTER — Ambulatory Visit: Payer: Medicare HMO | Admitting: Adult Health

## 2019-09-04 VITALS — BP 130/66 | HR 69 | Ht 59.0 in | Wt 136.0 lb

## 2019-09-04 DIAGNOSIS — G309 Alzheimer's disease, unspecified: Secondary | ICD-10-CM

## 2019-09-04 MED ORDER — MEMANTINE HCL ER 7 MG PO CP24: 7.0000 mg | ORAL_CAPSULE | Freq: Every day | ORAL | 11 refills | Status: DC

## 2019-09-04 NOTE — Progress Notes (Signed)
PATIENT: [name] [name] DOB: October 03, 1931  REASON FOR VISIT: follow up HISTORY FROM: patient  HISTORY OF PRESENT ILLNESS: Today 09/04/19:  [name] is an 86 year old female with a history of memory disturbance.  She returns today for follow-up.  At the last visit she was started on Aricept.  However her daughter reports that this made her sick.  She is no longer taking this medication.  She continues to live at home with her daughter.  She is able to complete all ADLs independently.  Her daughter manages her finances and medications.  She no longer operates a motor vehicle.  No significant change in mood or behavior.  Reports that she sleeps well.  She does go to an adult day center 3 days a week.  Overall she is doing well.  Returns today for an evaluation.  HISTORY (Copied from Dr.Dohmeier's note) [name] [name] a 86 y.o. year old African American female patientseen on 03/31/2019. [name] [name] is seen here today in the presence of her family member, she arrived with a seated walker, she had undergone a sleep study on December 16, 2018 which showed mild apnea but REM dependent apnea.  Her AHI was 14 and during REM sleep 23.2/h her RDI was 34.1/h during REM sleep indicating loud snoring.  The patient was focused with a auto titration CPAP with a pressure window between 6 and 16 cmH2O 3 cm EPR and did well for the month of December she used the machine 100 AutoSet is 30 out of 30 days 100% and 97% by time with an average use at time of 5 hours and 46 minutes.  The residual AHI was 5.1 without central apneas emerging, the 95th percentile pressure was 10.7 cm water.  There were few central apneas emerging, however [name] tells me today that she has not continued to use the machine she has not used the CPAP in the new year 2021.  Additionally,  we are also looking at her memory which seems to be affected by a progressive dementia she scored 15 out of 30 points in a Mini-Mental status examination  today.  She underwent an MRI brain study on 25 December 2018 also ordered by my self for memory loss evaluation it showed moderate atrophy of the brain chronic small vessel disease and no acute injuries tumors or bleed.  This would also be an age-related finding but can be related to dementia.  Especially brain atrophy at the temporal sulci is typical for memory loss disorders.  I agree that it is not likely for [name] to continue CPAP therapy on er own, and her memory loss will make compliance difficult.  Her daughter reports fighting about the CPAP.    REVIEW OF SYSTEMS: Out of a complete 14 system review of symptoms, the patient complains only of the following symptoms, and all other reviewed systems are negative.  See HPI  ALLERGIES: No Known Allergies  HOME MEDICATIONS: Outpatient Medications Prior to Visit  Medication Sig Dispense Refill  . acetaminophen (TYLENOL) 650 MG CR tablet Take 650-1,300 mg by mouth every 8 (eight) hours as needed (arthritis pain).    [name] aspirin EC 81 MG tablet Take 81 mg by mouth daily.    [name] atenolol-chlorthalidone (TENORETIC) 100-25 MG tablet Take 1 tablet by mouth daily. 90 tablet 3  . Multiple Vitamin (MULITIVITAMIN WITH MINERALS) TABS Take 1 tablet by mouth daily.      [name] OVER THE COUNTER MEDICATION Take 1 capsule by mouth daily.  Omega XL    . simvastatin (ZOCOR) 40 MG tablet Take 1 tablet (40 mg total) by mouth at bedtime. 90 tablet 1  . sodium chloride (OCEAN) 0.65 % SOLN nasal spray Place 1 spray into both nostrils as needed for congestion.    [name] spironolactone (ALDACTONE) 25 MG tablet Take 1 tablet (25 mg total) by mouth daily. 90 tablet 3  . triamcinolone cream (KENALOG) 0.5 % APPLY TOPICALLY 3 TIMES  DAILY 240 g 3   No facility-administered medications prior to visit.    PAST MEDICAL HISTORY: Past Medical History:  Diagnosis Date  . Allergy   . Arthritis   . Asthma   . Cataract   . Diverticulosis   . Dyslipidemia   . GERD  (gastroesophageal reflux disease)   . Hemorrhoids   . History of atrial fibrillation   . Hypertension   . Menopause   . Obesity   . PVD (peripheral vascular disease) ([place])     PAST SURGICAL HISTORY: Past Surgical History:  Procedure Laterality Date  . ABDOMINAL AORTAGRAM N/A 02/09/2012   Procedure: ABDOMINAL Maxcine Ham;  Surgeon: Elam Dutch, MD;  Location: [facility] CATH LAB;  Service: Cardiovascular;  Laterality: N/A;  . COLONOSCOPY  02/2004  . EYE SURGERY     CATARACT (BILATERAL)  . HERNIA REPAIR     abdominal x 2  . right femoral to anterior tibial bypass  2004  . SHOULDER SURGERY     age 62 due to dislocation  . TEMPORAL ARTERY BIOPSY / LIGATION      FAMILY HISTORY: Family History  Problem Relation Age of Onset  . Cancer Sister   . Mental retardation Paternal Aunt   . Heart disease Son        died age 34yo  . Hyperlipidemia Son   . Heart attack Son     SOCIAL HISTORY: Social History   Socioeconomic History  . Marital status: Widowed    Spouse name: Not on file  . Number of children: Not on file  . Years of education: Not on file  . Highest education level: Not on file  Occupational History  . Not on file  Tobacco Use  . Smoking status: Never Smoker  . Smokeless tobacco: Never Used  Vaping Use  . Vaping Use: Never used  Substance and Sexual Activity  . Alcohol use: No  . Drug use: No  . Sexual activity: Not Currently  Other Topics Concern  . Not on file  Social History Narrative   Lives at home with husband, exercise some with water aerobics 2 days per week, has 3 children, 6 grandchildren, 2 great grandchildren   Social Determinants of Health   Financial Resource Strain:   . Difficulty of Paying Living Expenses:   Food Insecurity:   . Worried About Charity fundraiser in the Last Year:   . Arboriculturist in the Last Year:   Transportation Needs:   . Film/video editor (Medical):   [name] Lack of Transportation (Non-Medical):   Physical Activity:    . Days of Exercise per Week:   . Minutes of Exercise per Session:   Stress:   . Feeling of Stress :   Social Connections:   . Frequency of Communication with Friends and Family:   . Frequency of Social Gatherings with Friends and Family:   . Attends Religious Services:   . Active Member of Clubs or Organizations:   . Attends Archivist Meetings:   [name] Marital  Status:   Intimate Partner Violence:   . Fear of Current or Ex-Partner:   . Emotionally Abused:   [name] Physically Abused:   . Sexually Abused:       PHYSICAL EXAM  Vitals:   09/04/19 0842  BP: 130/66  Pulse: 69  Weight: 136 lb (61.7 kg)  Height: 4\' 11"  (1.499 m)   Body mass index is 27.47 kg/m.   MMSE - Mini Mental State Exam 09/04/2019 03/31/2019 12/03/2018  Orientation to time 1 1 5   Orientation to Place 3 3 5   Registration 3 3 3   Attention/ Calculation 0 1 1  Recall 1 0 0  Language- name 2 objects 2 2 2   Language- repeat 1 1 1   Language- follow 3 step command 3 3 3   Language- read & follow direction 1 1 1   Write a sentence 0 1 1  Copy design 0 0 0  Total score 15 16 22      Generalized: Well developed, in no acute distress   Neurological examination  Mentation: Alert  Follows all commands speech and language fluent Cranial nerve II-XII: Pupils were equal round reactive to light. Extraocular movements were full, visual field were full on confrontational test.. Head turning and shoulder shrug  were normal and symmetric. Motor: The motor testing reveals 5 over 5 strength of all 4 extremities. Good symmetric motor tone is noted throughout.  Sensory: Sensory testing is intact to soft touch on all 4 extremities. No evidence of extinction is noted.  Coordination: Cerebellar testing reveals good finger-nose-finger and heel-to-shin bilaterally.  Gait and station: Uses a Rollator when ambulating.   DIAGNOSTIC DATA (LABS, IMAGING, TESTING) - I reviewed patient records, labs, notes, testing and imaging  myself where available.  Lab Results  Component Value Date   WBC 5.4 06/10/2019   HGB 13.1 06/10/2019   HCT 39.4 06/10/2019   MCV 101 (H) 06/10/2019   PLT 207 06/10/2019      Component Value Date/Time   NA 131 (L) 06/10/2019 1110   K 4.0 06/10/2019 1110   CL 94 (L) 06/10/2019 1110   CO2 24 06/10/2019 1110   GLUCOSE 87 06/10/2019 1110   GLUCOSE 99 10/15/2017 1845   BUN 15 06/10/2019 1110   CREATININE 0.78 06/10/2019 1110   CREATININE 0.82 09/21/2016 0909   CALCIUM 9.7 06/10/2019 1110   PROT 6.5 06/10/2019 1110   ALBUMIN 4.2 06/10/2019 1110   AST 19 06/10/2019 1110   ALT 10 06/10/2019 1110   ALKPHOS 80 06/10/2019 1110   BILITOT 0.7 06/10/2019 1110   GFRNONAA 70 06/10/2019 1110   GFRAA 80 06/10/2019 1110   Lab Results  Component Value Date   CHOL 92 (L) 06/10/2019   HDL 62 06/10/2019   LDLCALC 18 06/10/2019   TRIG 48 06/10/2019   CHOLHDL 1.5 06/10/2019   No results found for: HGBA1C No results found for: VITAMINB12 Lab Results  Component Value Date   TSH 0.628 12/03/2018      ASSESSMENT AND PLAN 86 y.o. year old female  has a past medical history of Allergy, Arthritis, Asthma, Cataract, Diverticulosis, Dyslipidemia, GERD (gastroesophageal reflux disease), Hemorrhoids, History of atrial fibrillation, Hypertension, Menopause, Obesity, and PVD (peripheral vascular disease) ([place]). here with :  Alzheimer's disease   Memory score is stable  Discussed starting Namenda-daughter prefers long-acting.  Will start on Namenda XR 7 mg  Reviewed potential side effects with the patient and provided her with a handout  She will follow-up in 6 months or sooner if needed  I spent 20 minutes of face-to-face and non-face-to-face time with patient.  This included previsit chart review, lab review, study review, order entry, electronic health record documentation, patient education.  Ward Givens, MSN, NP-C 09/04/2019, 9:00 AM [facility] [address],  [place], [place] [address] [phone]

## 2019-09-04 NOTE — Patient Instructions (Signed)
Your Plan:  Continue Namenda XR Memory score is stable If your symptoms worsen or you develop new symptoms please let us know.   Thank you for coming to see [place] at [facility]. I hope we have been able to provide you high quality care today.  You may receive a patient satisfaction survey over the next few weeks. We would appreciate your feedback and comments so that we may continue to improve ourselves and the health of our patients.  Memantine extended release capsules What is this medicine? MEMANTINE (MEM an teen) is used to treat dementia caused by Alzheimer's disease. This medicine may be used for other purposes; ask your health care provider or pharmacist if you have questions. COMMON BRAND NAME(S): Namenda XR What should I tell my health care provider before I take this medicine? They need to know if you have any of these conditions:  difficulty passing urine  kidney disease  liver disease  seizures  an unusual or allergic reaction to memantine, other medicines, foods, dyes, or preservatives  pregnant or trying to get pregnant  breast-feeding How should I use this medicine? Take this medicine by mouth with a glass of water. Follow the directions on the prescription label. You may take this medicine with or without food. You may swallow the capsules whole or open them and sprinkle the entire contents on applesauce before swallowing. Other than sprinkling the medicine on applesauce, the capsules should be swallowed whole and not divided, chewed, or crushed. Take your doses at regular intervals. Do not take your medicine more often than directed. Continue to take your medicine even if you feel better. Do not stop taking except on the advice of your doctor or health care professional. Talk to your pediatrician regarding the use of this medicine in children. Special care may be needed. Overdosage: If you think you have taken too much of this medicine contact a  poison control center or emergency room at once. NOTE: This medicine is only for you. Do not share this medicine with others. What if I miss a dose? If you miss a dose, take it as soon as you can. If it is almost time for your next dose, take only that dose. Do not take double or extra doses. If you do not take your medicine for several days, contact your health care provider. Your dose may need to be changed. What may interact with this medicine?  acetazolamide  amantadine  cimetidine  dextromethorphan  dofetilide  hydrochlorothiazide  ketamine  metformin  methazolamide  quinidine  ranitidine  sodium bicarbonate  triamterene This list may not describe all possible interactions. Give your health care provider a list of all the medicines, herbs, non-prescription drugs, or dietary supplements you use. Also tell them if you smoke, drink alcohol, or use illegal drugs. Some items may interact with your medicine. What should I watch for while using this medicine? Visit your doctor or health care professional for regular checks on your progress. Check with your doctor or health care professional if there is no improvement in your symptoms or if they get worse. You may get drowsy or dizzy. Do not drive, use machinery, or do anything that needs mental alertness until you know how this drug affects you. Do not stand or sit up quickly, especially if you are an older patient. This reduces the risk of dizzy or fainting spells. Alcohol can make you more drowsy and dizzy. Avoid alcoholic drinks. What side effects may I notice from  receiving this medicine? Side effects that you should report to your doctor or health care professional as soon as possible:  agitation or a feeling of restlessness  allergic reactions like skin rash, itching or hives, swelling of the face, lips, or tongue  depressed mood  dizziness  hallucinations  redness, blistering, peeling or loosening of the skin,  including inside the mouth  seizures  vomiting Side effects that usually do not require medical attention (report to your doctor or health care professional if they continue or are bothersome):  constipation  diarrhea  headache  nausea  trouble sleeping This list may not describe all possible side effects. Call your doctor for medical advice about side effects. You may report side effects to FDA at 1-800-FDA-1088. Where should I keep my medicine? Keep out of the reach of children. Store at room temperature between 15 degrees and 30 degrees C (59 degrees and 86 degrees F). Throw away any unused medicine after the expiration date. NOTE: This sheet is a summary. It may not cover all possible information. If you have questions about this medicine, talk to your doctor, pharmacist, or health care provider.  2020 Elsevier/Gold Standard (2015-03-25 10:09:47)

## 2019-09-09 ENCOUNTER — Telehealth: Payer: Self-pay

## 2019-09-09 ENCOUNTER — Other Ambulatory Visit: Payer: Self-pay

## 2019-09-09 DIAGNOSIS — E785 Hyperlipidemia, unspecified: Secondary | ICD-10-CM

## 2019-09-09 MED ORDER — SIMVASTATIN 40 MG PO TABS: 40.0000 mg | ORAL_TABLET | Freq: Every day | ORAL | 1 refills | Status: DC

## 2019-09-09 NOTE — Telephone Encounter (Signed)
Sent in [facility]

## 2019-09-09 NOTE — Telephone Encounter (Signed)
Received fax from [facility] for a refill on her Simvastatin pt. Last apt was 06/10/19 next apt is 06/10/20.

## 2019-10-06 IMAGING — DX DG FOOT COMPLETE 3+V*R*
3 series · 3 of 3 positions shown · non-contrast
Comparison: None.

CLINICAL DATA: Acute right foot pain and swelling after twisting
injury.

EXAM:
RIGHT FOOT COMPLETE - 3+ VIEW

[foot ap]
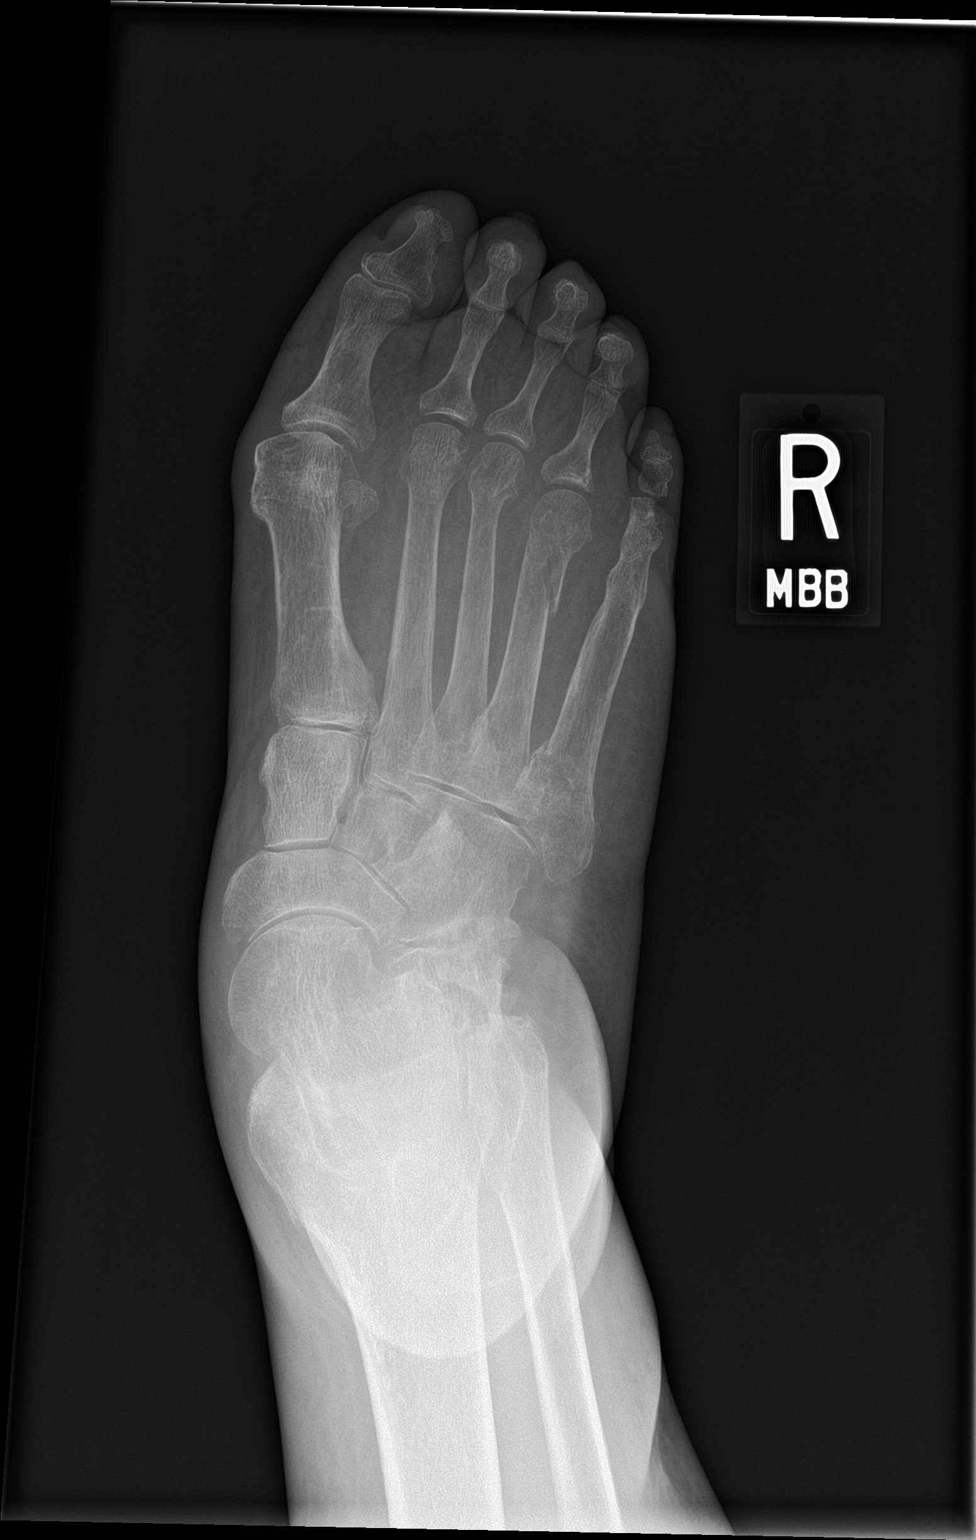

[foot obl]
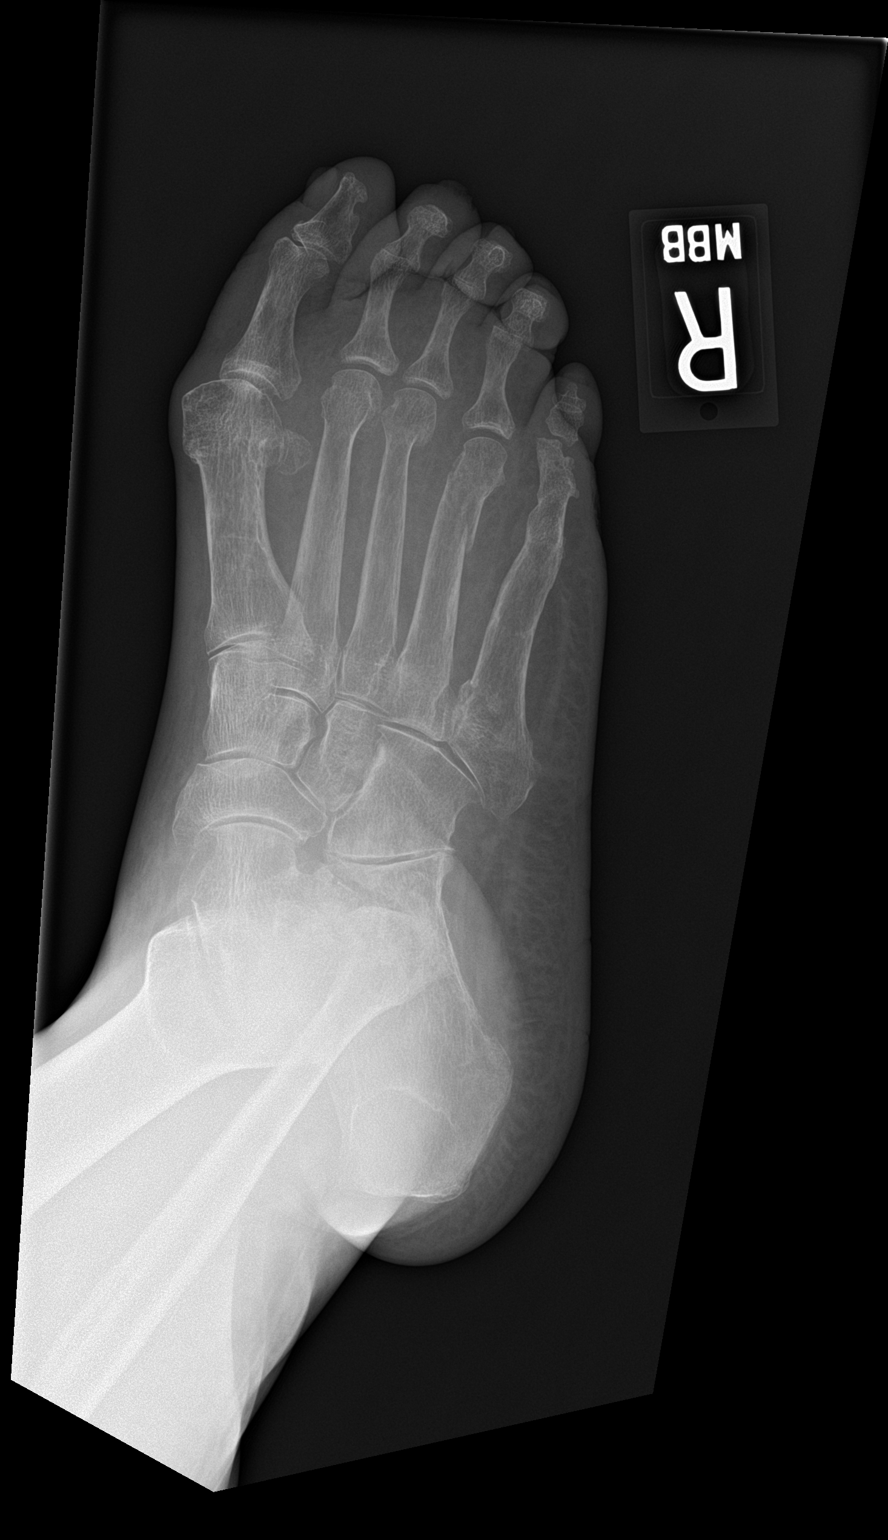

[foot lat]
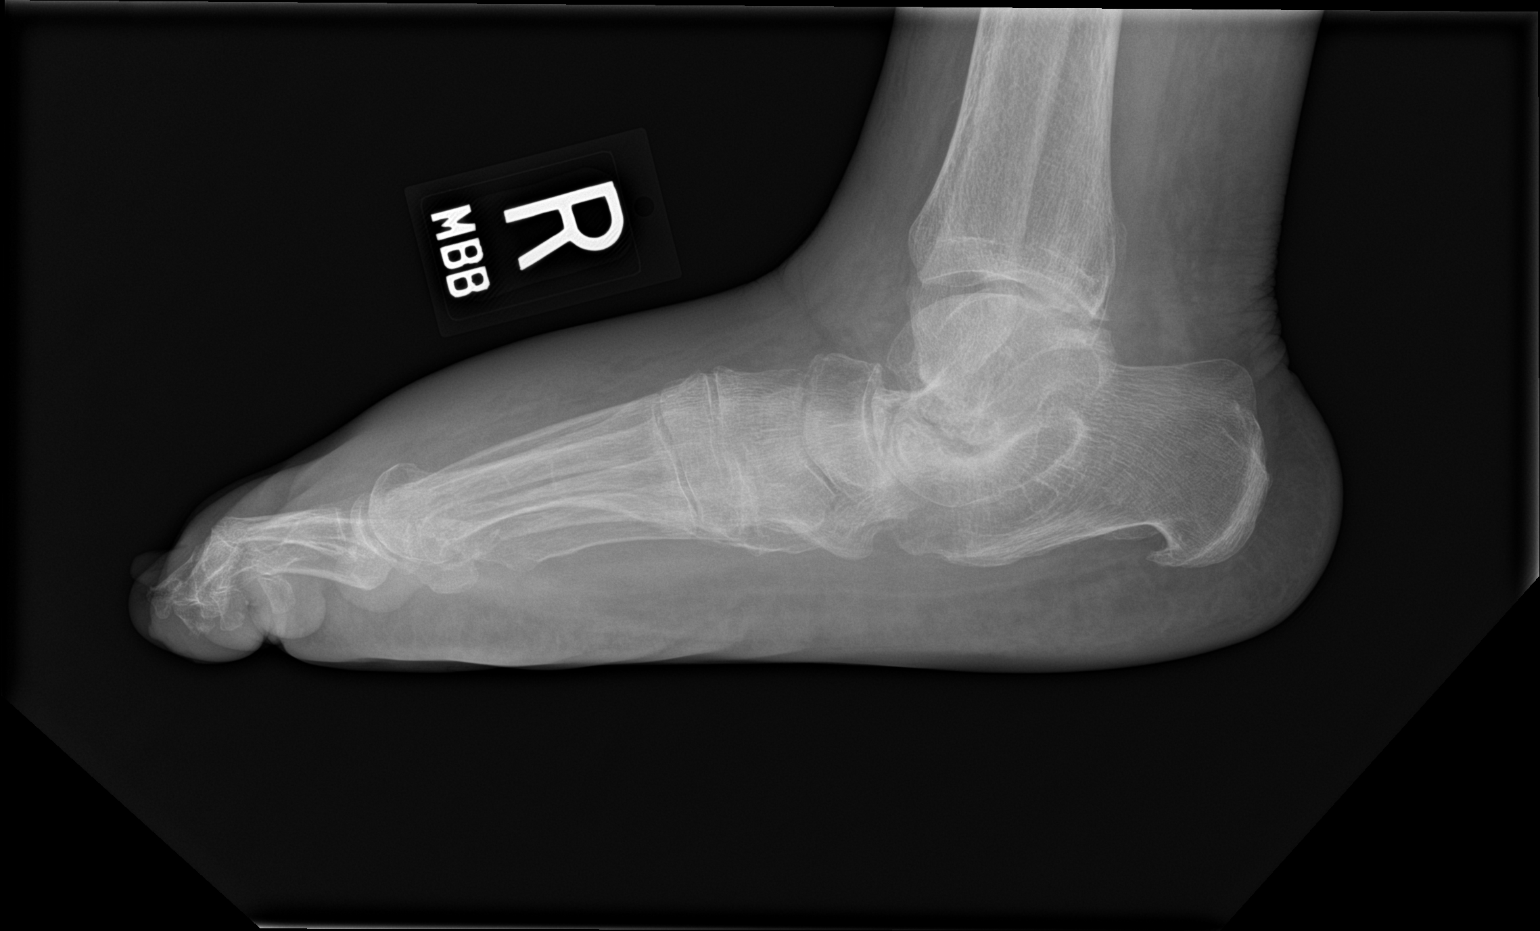

[3 of 3 positions shown; findings below may reference images not displayed]

FINDINGS: Mildly displaced fracture is seen involving the distal portion of
the fourth metatarsal. Spurring of posterior calcaneus is noted.
Probable old calcaneal fracture is noted. Dorsal soft tissue
swelling is noted. Fusion of the distal fifth metatarsal and fifth
proximal phalanx is noted.
IMPRESSION: Mildly displaced distal fourth metatarsal fracture. Dorsal soft
tissue swelling is noted suggesting edema or contusion.

## 2020-03-11 ENCOUNTER — Ambulatory Visit: Payer: Medicare HMO | Admitting: Adult Health

## 2020-03-29 ENCOUNTER — Other Ambulatory Visit: Payer: Self-pay | Admitting: Family Medicine

## 2020-03-29 DIAGNOSIS — I10 Essential (primary) hypertension: Secondary | ICD-10-CM

## 2020-06-10 ENCOUNTER — Ambulatory Visit: Payer: Medicare HMO | Admitting: Family Medicine

## 2020-06-10 NOTE — Progress Notes (Signed)
[name] [name] is a 87 y.o. female who presents for annual wellness visit and follow-up on chronic medical conditions.  She has the following concerns:  Immunizations and Health Maintenance Immunization History  Administered Date(s) Administered  . Influenza Split 11/20/2011, 01/02/2013  . Influenza Whole 12/02/2007, 12/14/2008, 12/23/2010  . Influenza, High Dose Seasonal PF 11/27/2013, 12/20/2016, 12/26/2017, 12/02/2018  . Influenza-Unspecified 01/02/2014, 12/26/2017  . Pneumococcal Conjugate-13 12/06/2004  . Pneumococcal Polysaccharide-23 02/25/2013  . Td 12/06/2004  . Zoster 08/30/2009  . Zoster Recombinat (Shingrix) 12/20/2016   Health Maintenance Due  Topic Date Due  . COVID-19 Vaccine (1) Never done  . TETANUS/TDAP  12/07/2014    Last Pap smear: Last mammogram: Last colonoscopy: Last DEXA: Dentist: Ophtho: Exercise:  Other doctors caring for patient include:  Advanced directives:    Depression screen:  See questionnaire below.  Depression screen [facility] 2/9 06/10/2019 01/29/2018 09/21/2016 09/21/2016 04/05/2015  Decreased Interest 0 2 0 0 0  Down, Depressed, Hopeless 0 2 0 0 0  PHQ - 2 Score 0 4 0 0 0  Altered sleeping - 1 - - -  Tired, decreased energy - 2 - - -  Change in appetite - 2 - - -  Feeling bad or failure about yourself  - 0 - - -  Trouble concentrating - 2 - - -  Moving slowly or fidgety/restless - 1 - - -  PHQ-9 Score - 12 - - -  Difficult doing work/chores - Somewhat difficult - - -    Fall Risk Screen: see questionnaire below. Fall Risk  06/10/2019 12/03/2018 09/21/2016 09/21/2016 04/05/2015  Falls in the past year? 1 0 No No No  Number falls in past yr: 1 - - - -  Injury with Fall? 0 - - - -  Risk for fall due to : - - - - -    ADL screen:  See questionnaire below Functional Status Survey:     Review of Systems Constitutional: -, -unexpected weight change, -anorexia, -fatigue Allergy: -sneezing, -itching, -congestion Dermatology: denies changing  moles, rash, lumps ENT: -runny nose, -ear pain, -sore throat,  Cardiology:  -chest pain, -palpitations, -orthopnea, Respiratory: -cough, -shortness of breath, -dyspnea on exertion, -wheezing,  Gastroenterology: -abdominal pain, -nausea, -vomiting, -diarrhea, -constipation, -dysphagia Hematology: -bleeding or bruising problems Musculoskeletal: -arthralgias, -myalgias, -joint swelling, -back pain, - Ophthalmology: -vision changes,  Urology: -dysuria, -difficulty urinating,  -urinary frequency, -urgency, incontinence Neurology: -, -numbness, , -memory loss, -falls, -dizziness    PHYSICAL EXAM:  There were no vitals taken for this visit.  General Appearance: Alert, cooperative, no distress, appears stated age Head: Normocephalic, without obvious abnormality, atraumatic Eyes: PERRL, conjunctiva/corneas clear, EOM's intact, fundi benign Ears: Normal TM's and external ear canals Nose: Nares normal, mucosa normal, no drainage or sinus tenderness Throat: Lips, mucosa, and tongue normal; teeth and gums normal Neck: Supple, no lymphadenopathy;  thyroid:  no enlargement/tenderness/nodules; no carotid bruit or JVD Lungs: Clear to auscultation bilaterally without wheezes, rales or ronchi; respirations unlabored Heart: Regular rate and rhythm, S1 and S2 normal, no murmur, rubor gallop Abdomen: Soft, non-tender, nondistended, normoactive bowel sounds,  no masses, no hepatosplenomegaly Extremities: No clubbing, cyanosis or edema Pulses: 2+ and symmetric all extremities Skin:  Skin color, texture, turgor normal, no rashes or lesions Lymph nodes: Cervical, supraclavicular, and axillary nodes normal Neurologic:  CNII-XII intact, normal strength, sensation and gait; reflexes 2+ and symmetric throughout Psych: Normal mood, affect, hygiene and grooming.  ASSESSMENT/PLAN:    Discussed monthly self breast exams and yearly  mammograms; at least 30 minutes of aerobic activity at least 5 days/week and  weight-bearing exercise 2x/week; proper sunscreen use reviewed; healthy diet, including goals of calcium and vitamin D intake and alcohol recommendations (less than or equal to 1 drink/day) reviewed; regular seatbelt use; changing batteries in smoke detectors.  Immunization recommendations discussed.  Colonoscopy recommendations reviewed   Medicare Attestation I have personally reviewed: The patient's medical and social history Their use of alcohol, tobacco or illicit drugs Their current medications and supplements The patient's functional ability including ADLs,fall risks, home safety risks, cognitive, and hearing and visual impairment Diet and physical activities Evidence for depression or mood disorders  The patient's weight, height, and BMI have been recorded in the chart.  I have made referrals, counseling, and provided education to the patient based on review of the above and I have provided the patient with a written personalized care plan for preventive services.     Jill Alexanders, MD   06/10/2020

## 2020-06-14 ENCOUNTER — Encounter: Payer: Self-pay | Admitting: Family Medicine

## 2020-07-04 ENCOUNTER — Other Ambulatory Visit: Payer: Self-pay | Admitting: Family Medicine

## 2020-07-04 DIAGNOSIS — E785 Hyperlipidemia, unspecified: Secondary | ICD-10-CM

## 2020-07-13 ENCOUNTER — Ambulatory Visit: Payer: Medicare HMO | Admitting: Adult Health

## 2020-07-13 ENCOUNTER — Encounter: Payer: Self-pay | Admitting: Adult Health

## 2020-07-13 ENCOUNTER — Other Ambulatory Visit: Payer: Self-pay

## 2020-07-13 VITALS — BP 138/74 | HR 63 | Ht 60.0 in | Wt 132.0 lb

## 2020-07-13 DIAGNOSIS — G309 Alzheimer's disease, unspecified: Secondary | ICD-10-CM | POA: Diagnosis not present

## 2020-07-13 MED ORDER — MEMANTINE HCL ER 14 MG PO CP24: 14.0000 mg | ORAL_CAPSULE | Freq: Every day | ORAL | 11 refills | Status: AC

## 2020-07-13 NOTE — Patient Instructions (Signed)
Your Plan:  Increase Namenda XR to 14 mg daily Memory score is stable     Thank you for coming to see [place] at [facility]. I hope we have been able to provide you high quality care today.  You may receive a patient satisfaction survey over the next few weeks. We would appreciate your feedback and comments so that we may continue to improve ourselves and the health of our patients.

## 2020-07-13 NOTE — Progress Notes (Signed)
PATIENT: [name] [name] DOB: 1931-09-20  REASON FOR VISIT: follow up HISTORY FROM: patient  HISTORY OF PRESENT ILLNESS: Today 07/13/20:  [name] [name] is an 87 year old female with a history of memory disturbance.  She returns today for follow-up.  She is here today with her daughter.  At the last visit she was started on Namenda extended release 7 mg daily.  The patient and her daughter report that she has tolerated this well.  She continues to live with her daughter.  She is able to complete all ADLs independently.  Her daughter helps manage her medications and appointments.  No change in mood and behavior.  Overall the patient feels that she is doing well.  She returns today for an evaluation.  09/04/19: [name] [name] is an 87 year old female with a history of memory disturbance.  She returns today for follow-up.  At the last visit she was started on Aricept.  However her daughter reports that this made her sick.  She is no longer taking this medication.  She continues to live at home with her daughter.  She is able to complete all ADLs independently.  Her daughter manages her finances and medications.  She no longer operates a motor vehicle.  No significant change in mood or behavior.  Reports that she sleeps well.  She does go to an adult day center 3 days a week.  Overall she is doing well.  Returns today for an evaluation.  HISTORY (Copied from Dr.Dohmeier's note) [name] [name] a 87 y.o. year old African American female patientseen on 03/31/2019. [name] [name] is seen here today in the presence of her family member, she arrived with a seated walker, she had undergone a sleep study on December 16, 2018 which showed mild apnea but REM dependent apnea.  Her AHI was 14 and during REM sleep 23.2/h her RDI was 34.1/h during REM sleep indicating loud snoring.  The patient was focused with a auto titration CPAP with a pressure window between 6 and 16 cmH2O 3 cm EPR and did well for the month of December she  used the machine 100 AutoSet is 30 out of 30 days 100% and 97% by time with an average use at time of 5 hours and 46 minutes.  The residual AHI was 5.1 without central apneas emerging, the 95th percentile pressure was 10.7 cm water.  There were few central apneas emerging, however [name] [name] tells me today that she has not continued to use the machine she has not used the CPAP in the new year 2021.  Additionally,  we are also looking at her memory which seems to be affected by a progressive dementia she scored 15 out of 30 points in a Mini-Mental status examination today.  She underwent an MRI brain study on 25 December 2018 also ordered by my self for memory loss evaluation it showed moderate atrophy of the brain chronic small vessel disease and no acute injuries tumors or bleed.  This would also be an age-related finding but can be related to dementia.  Especially brain atrophy at the temporal sulci is typical for memory loss disorders.  I agree that it is not likely for [name] [name] to continue CPAP therapy on er own, and her memory loss will make compliance difficult.  Her daughter reports fighting about the CPAP.    REVIEW OF SYSTEMS: Out of a complete 14 system review of symptoms, the patient complains only of the following symptoms, and all other reviewed systems are negative.  See HPI  ALLERGIES: No Known Allergies  HOME MEDICATIONS: Outpatient Medications Prior to Visit  Medication Sig Dispense Refill  . acetaminophen (TYLENOL) 650 MG CR tablet Take 650-1,300 mg by mouth every 8 (eight) hours as needed (arthritis pain).    [name] aspirin EC 81 MG tablet Take 81 mg by mouth daily.    [name] atenolol-chlorthalidone (TENORETIC) 100-25 MG tablet TAKE 1 TABLET EVERY DAY 90 tablet 3  . memantine (NAMENDA XR) 7 MG CP24 24 hr capsule Take 1 capsule (7 mg total) by mouth daily. 30 capsule 11  . Multiple Vitamin (MULITIVITAMIN WITH MINERALS) TABS Take 1 tablet by mouth daily.    [name] OVER THE COUNTER  MEDICATION Take 1 capsule by mouth daily. Omega XL    . simvastatin (ZOCOR) 40 MG tablet TAKE 1 TABLET (40 MG TOTAL) BY MOUTH AT BEDTIME. 90 tablet 0  . sodium chloride (OCEAN) 0.65 % SOLN nasal spray Place 1 spray into both nostrils as needed for congestion.    [name] spironolactone (ALDACTONE) 25 MG tablet TAKE 1 TABLET (25 MG TOTAL) BY MOUTH DAILY. 90 tablet 3  . triamcinolone cream (KENALOG) 0.5 % APPLY TOPICALLY 3 TIMES  DAILY 240 g 3   No facility-administered medications prior to visit.    PAST MEDICAL HISTORY: Past Medical History:  Diagnosis Date  . Allergy   . Arthritis   . Asthma   . Cataract   . Diverticulosis   . Dyslipidemia   . GERD (gastroesophageal reflux disease)   . Hemorrhoids   . History of atrial fibrillation   . Hypertension   . Menopause   . Obesity   . PVD (peripheral vascular disease) ([place])     PAST SURGICAL HISTORY: Past Surgical History:  Procedure Laterality Date  . ABDOMINAL AORTAGRAM N/A 02/09/2012   Procedure: ABDOMINAL Maxcine Ham;  Surgeon: Elam Dutch, MD;  Location: [facility] CATH LAB;  Service: Cardiovascular;  Laterality: N/A;  . COLONOSCOPY  02/2004  . EYE SURGERY     CATARACT (BILATERAL)  . HERNIA REPAIR     abdominal x 2  . right femoral to anterior tibial bypass  2004  . SHOULDER SURGERY     age 67 due to dislocation  . TEMPORAL ARTERY BIOPSY / LIGATION      FAMILY HISTORY: Family History  Problem Relation Age of Onset  . Cancer Sister   . Mental retardation Paternal Aunt   . Heart disease Son        died age 48yo  . Hyperlipidemia Son   . Heart attack Son     SOCIAL HISTORY: Social History   Socioeconomic History  . Marital status: Widowed    Spouse name: Not on file  . Number of children: Not on file  . Years of education: Not on file  . Highest education level: Not on file  Occupational History  . Not on file  Tobacco Use  . Smoking status: Never Smoker  . Smokeless tobacco: Never Used  Vaping Use  . Vaping Use:  Never used  Substance and Sexual Activity  . Alcohol use: No  . Drug use: No  . Sexual activity: Not Currently  Other Topics Concern  . Not on file  Social History Narrative   Lives at home with husband, exercise some with water aerobics 2 days per week, has 3 children, 6 grandchildren, 2 great grandchildren   Social Determinants of Radio broadcast assistant Strain: Not on file  Food Insecurity: Not on file  Transportation Needs:  Not on file  Physical Activity: Not on file  Stress: Not on file  Social Connections: Not on file  Intimate Partner Violence: Not on file      PHYSICAL EXAM  Vitals:   07/13/20 1057  BP: 138/74  Pulse: 63  Weight: 132 lb (59.9 kg)  Height: 5' (1.524 m)   Body mass index is 25.78 kg/m.   MMSE - Mini Mental State Exam 07/13/2020 09/04/2019 03/31/2019  Orientation to time 2 1 1   Orientation to Place 3 3 3   Registration 3 3 3   Attention/ Calculation 1 0 1  Recall 0 1 0  Language- name 2 objects 2 2 2   Language- repeat 1 1 1   Language- follow 3 step command 3 3 3   Language- read & follow direction 1 1 1   Write a sentence 1 0 1  Copy design 0 0 0  Total score 17 15 16      Generalized: Well developed, in no acute distress   Neurological examination  Mentation: Alert  Follows all commands speech and language fluent Cranial nerve II-XII: Pupils were equal round reactive to light. Extraocular movements were full, visual field were full on confrontational test.. Head turning and shoulder shrug  were normal and symmetric. Motor: The motor testing reveals 5 over 5 strength of all 4 extremities. Good symmetric motor tone is noted throughout.  Sensory: Sensory testing is intact to soft touch on all 4 extremities. No evidence of extinction is noted.  Coordination: Cerebellar testing reveals good finger-nose-finger and heel-to-shin bilaterally.  Gait and station: Uses a Rollator when ambulating.   DIAGNOSTIC DATA (LABS, IMAGING, TESTING) - I  reviewed patient records, labs, notes, testing and imaging myself where available.  Lab Results  Component Value Date   WBC 5.4 06/10/2019   HGB 13.1 06/10/2019   HCT 39.4 06/10/2019   MCV 101 (H) 06/10/2019   PLT 207 06/10/2019      Component Value Date/Time   NA 131 (L) 06/10/2019 1110   K 4.0 06/10/2019 1110   CL 94 (L) 06/10/2019 1110   CO2 24 06/10/2019 1110   GLUCOSE 87 06/10/2019 1110   GLUCOSE 99 10/15/2017 1845   BUN 15 06/10/2019 1110   CREATININE 0.78 06/10/2019 1110   CREATININE 0.82 09/21/2016 0909   CALCIUM 9.7 06/10/2019 1110   PROT 6.5 06/10/2019 1110   ALBUMIN 4.2 06/10/2019 1110   AST 19 06/10/2019 1110   ALT 10 06/10/2019 1110   ALKPHOS 80 06/10/2019 1110   BILITOT 0.7 06/10/2019 1110   GFRNONAA 70 06/10/2019 1110   GFRAA 80 06/10/2019 1110   Lab Results  Component Value Date   CHOL 92 (L) 06/10/2019   HDL 62 06/10/2019   LDLCALC 18 06/10/2019   TRIG 48 06/10/2019   CHOLHDL 1.5 06/10/2019    Lab Results  Component Value Date   TSH 0.628 12/03/2018      ASSESSMENT AND PLAN 87 y.o. year old female  has a past medical history of Allergy, Arthritis, Asthma, Cataract, Diverticulosis, Dyslipidemia, GERD (gastroesophageal reflux disease), Hemorrhoids, History of atrial fibrillation, Hypertension, Menopause, Obesity, and PVD (peripheral vascular disease) ([place]). here with :  Alzheimer's disease   Memory score is stable 17/30  Will increase  Namenda XR to 14 mg  She will follow-up in 6 months or sooner if needed    Ward Givens, MSN, NP-C 07/13/2020, 11:10 AM [facility] [address] [place], [place] [address] [phone]

## 2020-07-27 ENCOUNTER — Ambulatory Visit: Payer: Medicare HMO | Admitting: Cardiovascular Disease

## 2020-07-27 ENCOUNTER — Encounter: Payer: Self-pay | Admitting: Cardiovascular Disease

## 2020-07-27 ENCOUNTER — Other Ambulatory Visit: Payer: Self-pay

## 2020-07-27 DIAGNOSIS — I10 Essential (primary) hypertension: Secondary | ICD-10-CM | POA: Diagnosis not present

## 2020-07-27 DIAGNOSIS — E782 Mixed hyperlipidemia: Secondary | ICD-10-CM | POA: Diagnosis not present

## 2020-07-27 NOTE — Progress Notes (Signed)
07/27/2020 [name] [name]   Dec 30, 1931  [id number]  Primary Physician Denita Lung, MD Primary Cardiologist: Lorretta Harp MD FACP, [place], [place], [place]  HPI:  [name] [name] is a 87 y.o.  moderately overweight widowed African-American female whose husband of 35 years died 07/10/2017. She is a mother of 2, grandmother 6 grandchildren. She is accompanied by her daughter [name] [name] his husband [name] [name] is also a patient of mine. She was referred by Dr. Redmond School for ongoing cardiovascular evaluation because her previous cardiologist, Dr. Wynonia Lawman, is currently out on sick leave.  I last saw her in the office 07/01/2019.[name] KitchenHer risk factors include treated hypertension hyperlipidemia. She is never had a heart attack or stroke. She denies chest pain or shortness of breath. There is no family history of heart disease. She did lose 130 pounds on her own as result of diet and exercise over 3 years and feels clinically improved.  Since I saw her a year ago she continues to do well.  She walks with a walker.  She denies chest pain or shortness of breath.   Current Meds  Medication Sig  . acetaminophen (TYLENOL) 650 MG CR tablet Take 650-1,300 mg by mouth every 8 (eight) hours as needed (arthritis pain).  [name] [name] aspirin EC 81 MG tablet Take 81 mg by mouth daily.  [name] [name] atenolol-chlorthalidone (TENORETIC) 100-25 MG tablet TAKE 1 TABLET EVERY DAY  . memantine (NAMENDA XR) 14 MG CP24 24 hr capsule Take 1 capsule (14 mg total) by mouth daily.  . Multiple Vitamin (MULITIVITAMIN WITH MINERALS) TABS Take 1 tablet by mouth daily.  [name] [name] OVER THE COUNTER MEDICATION Take 1 capsule by mouth daily. Omega XL  . simvastatin (ZOCOR) 40 MG tablet TAKE 1 TABLET (40 MG TOTAL) BY MOUTH AT BEDTIME.  . sodium chloride (OCEAN) 0.65 % SOLN nasal spray Place 1 spray into both nostrils as needed for congestion.  [name] [name] spironolactone (ALDACTONE) 25 MG tablet TAKE 1 TABLET (25 MG TOTAL) BY MOUTH DAILY.  [name] [name] triamcinolone cream (KENALOG) 0.5  % APPLY TOPICALLY 3 TIMES  DAILY     No Known Allergies  Social History   Socioeconomic History  . Marital status: Widowed    Spouse name: Not on file  . Number of children: Not on file  . Years of education: Not on file  . Highest education level: Not on file  Occupational History  . Not on file  Tobacco Use  . Smoking status: Never Smoker  . Smokeless tobacco: Never Used  Vaping Use  . Vaping Use: Never used  Substance and Sexual Activity  . Alcohol use: No  . Drug use: No  . Sexual activity: Not Currently  Other Topics Concern  . Not on file  Social History Narrative   Lives at home with husband, exercise some with water aerobics 2 days per week, has 3 children, 6 grandchildren, 2 great grandchildren   Social Determinants of Radio broadcast assistant Strain: Not on file  Food Insecurity: Not on file  Transportation Needs: Not on file  Physical Activity: Not on file  Stress: Not on file  Social Connections: Not on file  Intimate Partner Violence: Not on file     Review of Systems: General: negative for chills, fever, night sweats or weight changes.  Cardiovascular: negative for chest pain, dyspnea on exertion, edema, orthopnea, palpitations, paroxysmal nocturnal dyspnea or shortness of breath Dermatological: negative for rash Respiratory: negative for cough or wheezing Urologic: negative for hematuria Abdominal:  negative for nausea, vomiting, diarrhea, bright red blood per rectum, melena, or hematemesis Neurologic: negative for visual changes, syncope, or dizziness All other systems reviewed and are otherwise negative except as noted above.    Blood pressure 106/62, pulse (!) 57, height 5' (1.524 m), weight 130 lb (59 kg).  General appearance: alert and no distress Neck: no adenopathy, no carotid bruit, no JVD, supple, symmetrical, trachea midline and thyroid not enlarged, symmetric, no tenderness/mass/nodules Lungs: clear to auscultation bilaterally Heart:  regular rate and rhythm, S1, S2 normal, no murmur, click, rub or gallop Extremities: extremities normal, atraumatic, no cyanosis or edema Pulses: 2+ and symmetric Skin: Skin color, texture, turgor normal. No rashes or lesions Neurologic: Alert and oriented X 3, normal strength and tone. Normal symmetric reflexes. Normal coordination and gait  EKG sinus bradycardia 57 with voltage criteria for LVH and nonspecific ST and T wave changes.  I personally reviewed this EKG.  ASSESSMENT AND PLAN:   Hyperlipidemia History of hyperlipidemia on statin therapy with lipid profile performed 06/10/2019 revealing total cholesterol of 92, LDL of 18 and HDL 62.  Essential hypertension, benign History of essential hypertension blood pressure measured today 106/62.  She is on atenolol and chlorthalidone, as well as spironolactone.      Lorretta Harp MD FACP,FACC,FAHA, [facility] 07/27/2020 9:20 AM

## 2020-07-27 NOTE — Assessment & Plan Note (Signed)
History of essential hypertension blood pressure measured today 106/62.  She is on atenolol and chlorthalidone, as well as spironolactone.

## 2020-07-27 NOTE — Assessment & Plan Note (Signed)
History of hyperlipidemia on statin therapy with lipid profile performed 06/10/2019 revealing total cholesterol of 92, LDL of 18 and HDL 62.

## 2020-07-27 NOTE — Patient Instructions (Signed)

## 2020-10-12 ENCOUNTER — Encounter: Payer: Self-pay | Admitting: Family Medicine

## 2020-10-12 ENCOUNTER — Ambulatory Visit (INDEPENDENT_AMBULATORY_CARE_PROVIDER_SITE_OTHER): Payer: Medicare HMO | Admitting: Family Medicine

## 2020-10-12 VITALS — BP 100/64 | HR 67 | Temp 98.4°F | Ht 59.0 in | Wt 131.6 lb

## 2020-10-12 DIAGNOSIS — G4733 Obstructive sleep apnea (adult) (pediatric): Secondary | ICD-10-CM

## 2020-10-12 DIAGNOSIS — K219 Gastro-esophageal reflux disease without esophagitis: Secondary | ICD-10-CM | POA: Diagnosis not present

## 2020-10-12 DIAGNOSIS — F0280 Dementia in other diseases classified elsewhere without behavioral disturbance: Secondary | ICD-10-CM

## 2020-10-12 DIAGNOSIS — M158 Other polyosteoarthritis: Secondary | ICD-10-CM | POA: Diagnosis not present

## 2020-10-12 DIAGNOSIS — E782 Mixed hyperlipidemia: Secondary | ICD-10-CM | POA: Diagnosis not present

## 2020-10-12 DIAGNOSIS — I10 Essential (primary) hypertension: Secondary | ICD-10-CM | POA: Diagnosis not present

## 2020-10-12 DIAGNOSIS — Z0000 Encounter for general adult medical examination without abnormal findings: Secondary | ICD-10-CM

## 2020-10-12 DIAGNOSIS — Z23 Encounter for immunization: Secondary | ICD-10-CM

## 2020-10-12 DIAGNOSIS — E785 Hyperlipidemia, unspecified: Secondary | ICD-10-CM

## 2020-10-12 DIAGNOSIS — J309 Allergic rhinitis, unspecified: Secondary | ICD-10-CM | POA: Diagnosis not present

## 2020-10-12 DIAGNOSIS — N3281 Overactive bladder: Secondary | ICD-10-CM | POA: Diagnosis not present

## 2020-10-12 DIAGNOSIS — I70219 Atherosclerosis of native arteries of extremities with intermittent claudication, unspecified extremity: Secondary | ICD-10-CM

## 2020-10-12 DIAGNOSIS — Z9181 History of falling: Secondary | ICD-10-CM

## 2020-10-12 DIAGNOSIS — G309 Alzheimer's disease, unspecified: Secondary | ICD-10-CM | POA: Diagnosis not present

## 2020-10-12 DIAGNOSIS — Z9849 Cataract extraction status, unspecified eye: Secondary | ICD-10-CM

## 2020-10-12 MED ORDER — SPIRONOLACTONE 25 MG PO TABS: 25.0000 mg | ORAL_TABLET | Freq: Every day | ORAL | 3 refills | Status: DC

## 2020-10-12 MED ORDER — SIMVASTATIN 40 MG PO TABS: 40.0000 mg | ORAL_TABLET | Freq: Every day | ORAL | 3 refills | Status: AC

## 2020-10-12 MED ORDER — ATENOLOL-CHLORTHALIDONE 100-25 MG PO TABS: 1.0000 | ORAL_TABLET | Freq: Every day | ORAL | 3 refills | Status: DC

## 2020-10-12 NOTE — Progress Notes (Signed)
[name] [name] is a 87 y.o. female who presents for annual wellness visit,CPE and follow-up on chronic medical conditions.  Her daughter who is her primary caregiver.  She does have a history of hyperlipidemia and is presently on Zocor.  She is also on atenolol/HCTZ as well as spironolactone.  He does have remote history of atrial fibrillation.  She is followed by neurology for her underlying Alzheimer's disease and is on Namenda for that.  She does have OSA and has tried CPAP but has had no luck with this and is not interested in pursuing it further.  She has no complaints concerning her allergies or reflux disease.  She does have a history of PVD but presently is not complaining of any leg pain.  She is in a walker.  She has had cataract surgery.  Apparently she did fall earlier today but has no complaints of any hip leg or shoulder discomfort.  She does have a history of OAB but at the present time is using diapers which seems to be doing okay with that.  Immunizations and Health Maintenance Immunization History  Administered Date(s) Administered   Influenza Split 11/20/2011, 01/02/2013   Influenza Whole 12/02/2007, 12/14/2008, 12/23/2010   Influenza, High Dose Seasonal PF 11/27/2013, 12/20/2016, 12/26/2017, 12/02/2018   Influenza-Unspecified 01/02/2014, 12/26/2017   Pneumococcal Conjugate-13 12/06/2004   Pneumococcal Polysaccharide-23 02/25/2013   Td 12/06/2004   Zoster Recombinat (Shingrix) 12/20/2016   Zoster, Live 08/30/2009   Health Maintenance Due  Topic Date Due   COVID-19 Vaccine (1) Never done   TETANUS/TDAP  12/07/2014   Zoster Vaccines- Shingrix (2 of 2) 02/14/2017   INFLUENZA VACCINE  10/04/2020    Last Pap smear: aged out  Last mammogram: 08/01/11 Last colonoscopy: 02/2004 Last DEXA: 08/01/11 Dentist: over two years Ophtho:Q  two years Exercise: walking and stretching daily    Other doctors caring for patient include: Dr. Brett Fairy neuro, Dr. Gwenlyn Found cardio   Advanced  directives: Does Patient Have a Medical Advance Directive?: No Would patient like information on creating a medical advance directive?: Yes (ED - Information included in AVS) Info given Depression screen:  See questionnaire below.  Depression screen [facility] 2/9 10/12/2020 06/10/2019 01/29/2018 09/21/2016 09/21/2016  Decreased Interest 0 0 2 0 0  Down, Depressed, Hopeless 0 0 2 0 0  PHQ - 2 Score 0 0 4 0 0  Altered sleeping - - 1 - -  Tired, decreased energy - - 2 - -  Change in appetite - - 2 - -  Feeling bad or failure about yourself  - - 0 - -  Trouble concentrating - - 2 - -  Moving slowly or fidgety/restless - - 1 - -  PHQ-9 Score - - 12 - -  Difficult doing work/chores - - Somewhat difficult - -    Fall Risk Screen: see questionnaire below. Fall Risk  10/12/2020 07/13/2020 06/10/2019 12/03/2018 09/21/2016  Falls in the past year? '1 1 1 '$ 0 No  Number falls in past yr: '1 1 1 '$ - -  Injury with Fall? 1 0 0 - -  Risk for fall due to : Impaired balance/gait - - - -  Follow up Falls evaluation completed;Education provided - - - -    ADL screen:  See questionnaire below Functional Status Survey: Is the patient deaf or have difficulty hearing?: No Does the patient have difficulty seeing, even when wearing glasses/contacts?: No Does the patient have difficulty concentrating, remembering, or making decisions?: Yes Does the patient have difficulty  walking or climbing stairs?: Yes Does the patient have difficulty dressing or bathing?: No Does the patient have difficulty doing errands alone such as visiting a doctor's office or shopping?: Yes   Review of Systems Constitutional: -, -unexpected weight change, -anorexia, -fatigue Allergy: -sneezing, -itching, -congestion Dermatology: denies changing moles, rash, lumps ENT: -runny nose, -ear pain, -sore throat,  Cardiology:  -chest pain, -palpitations, -orthopnea, Respiratory: -cough, -shortness of breath, -dyspnea on exertion, -wheezing,   Gastroenterology: -abdominal pain, -nausea, -vomiting, -diarrhea, -constipation, -dysphagia Hematology: -bleeding or bruising problems Musculoskeletal: -arthralgias, -myalgias, -joint swelling, -back pain, - Ophthalmology: -vision changes,  Urology: -dysuria, -difficulty urinating,  -urinary frequency, -urgency, incontinence Neurology: -, -numbness, , -memory loss, -falls, -dizziness    PHYSICAL EXAM:   General Appearance: Alert, cooperative, no distress, appears stated age Head: Normocephalic, without obvious abnormality, atraumatic Eyes: PERRL, conjunctiva/corneas clear, EOM's intact, fundi benign Ears: Normal TM's and external ear canals Nose: Nares normal, mucosa normal, no drainage or sinus tenderness Throat: Lips, mucosa, and tongue normal; teeth and gums normal Neck: Supple, no lymphadenopathy;  thyroid:  no enlargement/tenderness/nodules; no carotid bruit or JVD Lungs: Clear to auscultation bilaterally without wheezes, rales or ronchi; respirations unlabored Heart: Regular rate and rhythm, S1 and S2 normal, no murmur, rubor gallop Abdomen: Soft, non-tender, nondistended, normoactive bowel sounds,  no masses, no hepatosplenomegaly Extremities: No clubbing, cyanosis or edema.  Finger deformities noted on the left hand. Pulses: 2+ and symmetric all extremities Skin:  Skin color, texture, turgor normal, no rashes or lesions Lymph nodes: Cervical, supraclavicular, and axillary nodes normal Neurologic:  CNII-XII intact, normal strength, sensation and gait; reflexes 2+ and symmetric throughout Psych: Normal mood, affect, hygiene and grooming.  ASSESSMENT/PLAN: Routine general medical examination at a health care facility - Plan: CBC with Differential/Platelet, Comprehensive metabolic panel, Lipid panel  Mixed hyperlipidemia - Plan: Lipid panel  Essential hypertension, benign - Plan: CBC with Differential/Platelet, Comprehensive metabolic panel, atenolol-chlorthalidone  (TENORETIC) 100-25 MG tablet, spironolactone (ALDACTONE) 25 MG tablet  Other osteoarthritis involving multiple joints  Atherosclerotic PVD with intermittent claudication (HCC)  OAB (overactive bladder)  Mild obstructive sleep apnea-hypopnea syndrome  Alzheimer's disease (HCC)  Allergic rhinitis, unspecified seasonality, unspecified trigger  Gastroesophageal reflux disease, unspecified whether esophagitis present  History of cataract extraction, unspecified laterality  History of recent fall  Hyperlipidemia, unspecified hyperlipidemia type - Plan: simvastatin (ZOCOR) 40 MG tablet Continue on the present medication regimen.  Follow-up with ophthalmology as well as neurology.  Discussed the overactive bladder and I think at this point diapers is the best way to handle it.  Discussed the contractures of the fingers and at this point do not feel the need to pursue that.  Allergies seem to be under good control.  Although she had a recent fall there does not seem to be any damage from it.  She tried CPAP and was not tolerant of it and so further intervention not needed.  Immunization recommendations discussed.  Colonoscopy recommendations reviewed   Medicare Attestation I have personally reviewed: The patient's medical and social history Their use of alcohol, tobacco or illicit drugs Their current medications and supplements The patient's functional ability including ADLs,fall risks, home safety risks, cognitive, and hearing and visual impairment Diet and physical activities Evidence for depression or mood disorders  The patient's weight, height, and BMI have been recorded in the chart.  I have made referrals, counseling, and provided education to the patient based on review of the above and I have provided the patient with a written personalized  care plan for preventive services.     Jill Alexanders, MD   10/12/2020

## 2020-10-13 LAB — COMPREHENSIVE METABOLIC PANEL
ALT: 12 IU/L (ref 0–32)
AST: 21 IU/L (ref 0–40)
Albumin/Globulin Ratio: 1.5 (ref 1.2–2.2)
Albumin: 4.3 g/dL (ref 3.6–4.6)
Alkaline Phosphatase: 77 IU/L (ref 44–121)
BUN/Creatinine Ratio: 21 (ref 12–28)
BUN: 16 mg/dL (ref 8–27)
Bilirubin Total: 0.7 mg/dL (ref 0.0–1.2)
CO2: 24 mmol/L (ref 20–29)
Calcium: 10 mg/dL (ref 8.7–10.3)
Chloride: 94 mmol/L — ABNORMAL LOW (ref 96–106)
Creatinine, Ser: 0.75 mg/dL (ref 0.57–1.00)
Globulin, Total: 2.8 g/dL (ref 1.5–4.5)
Glucose: 86 mg/dL (ref 65–99)
Potassium: 3.7 mmol/L (ref 3.5–5.2)
Sodium: 131 mmol/L — ABNORMAL LOW (ref 134–144)
Total Protein: 7.1 g/dL (ref 6.0–8.5)
eGFR: 77 mL/min/{1.73_m2} (ref >59)

## 2020-10-13 LAB — CBC WITH DIFFERENTIAL/PLATELET
Basophils Absolute: 0 10*3/uL (ref 0.0–0.2)
Basos: 1 %
EOS (ABSOLUTE): 0.1 10*3/uL (ref 0.0–0.4)
Eos: 2 %
Hematocrit: 37.4 % (ref 34.0–46.6)
Hemoglobin: 12.8 g/dL (ref 11.1–15.9)
Immature Grans (Abs): 0 10*3/uL (ref 0.0–0.1)
Immature Granulocytes: 0 %
Lymphocytes Absolute: 1.3 10*3/uL (ref 0.7–3.1)
Lymphs: 31 %
MCH: 33.2 pg — ABNORMAL HIGH (ref 26.6–33.0)
MCHC: 34.2 g/dL (ref 31.5–35.7)
MCV: 97 fL (ref 79–97)
Monocytes Absolute: 0.5 10*3/uL (ref 0.1–0.9)
Monocytes: 12 %
Neutrophils Absolute: 2.2 10*3/uL (ref 1.4–7.0)
Neutrophils: 54 %
Platelets: 189 10*3/uL (ref 150–450)
RBC: 3.85 x10E6/uL (ref 3.77–5.28)
RDW: 11.8 % (ref 11.7–15.4)
WBC: 4.1 10*3/uL (ref 3.4–10.8)

## 2020-10-13 LAB — LIPID PANEL
Chol/HDL Ratio: 1.6 ratio (ref 0.0–4.4)
Cholesterol, Total: 103 mg/dL (ref 100–199)
HDL: 63 mg/dL (ref >39)
LDL Chol Calc (NIH): 26 mg/dL (ref 0–99)
Triglycerides: 65 mg/dL (ref 0–149)
VLDL Cholesterol Cal: 14 mg/dL (ref 5–40)

## 2020-10-13 NOTE — Progress Notes (Signed)
The blood work is normal 

## 2020-10-13 NOTE — Addendum Note (Signed)
Addended by: Elyse Jarvis on: 10/13/2020 08:01 AM   Modules accepted: Orders

## 2020-11-16 ENCOUNTER — Encounter: Payer: Self-pay | Admitting: Family Medicine

## 2020-11-16 ENCOUNTER — Ambulatory Visit (INDEPENDENT_AMBULATORY_CARE_PROVIDER_SITE_OTHER): Payer: Medicare HMO | Admitting: Family Medicine

## 2020-11-16 VITALS — BP 100/66 | HR 59 | Temp 97.6°F | Wt 133.0 lb

## 2020-11-16 DIAGNOSIS — M79603 Pain in arm, unspecified: Secondary | ICD-10-CM

## 2020-11-16 DIAGNOSIS — Z23 Encounter for immunization: Secondary | ICD-10-CM | POA: Diagnosis not present

## 2020-11-16 NOTE — Progress Notes (Signed)
   Subjective:    Patient ID: [name] [name], female    DOB: [birth year], 87 y.o.   MRN: [id number]  HPI She is here for a recheck.  She did get a COVID-vaccine on her last visit and apparently did have a sore arm and postvaccination symptoms.   Review of Systems     Objective:   Physical Exam Alert and in no distress otherwise not examined       Assessment & Plan:  Need for COVID-19 vaccine - Plan: PFIZER Comirnaty(GRAY TOP)COVID-19 Vaccine  Need for influenza vaccination - Plan: Flu Vaccine QUAD High Dose(Fluad) I explained the sore arm aches and pains are normal occurrence after the shot and this actually indicates that the injection is working to build up immunity.  She was comfortable with that and I gave the next series of shots.

## 2020-12-20 ENCOUNTER — Encounter: Payer: Self-pay | Admitting: Family Medicine

## 2020-12-30 ENCOUNTER — Telehealth: Payer: Self-pay

## 2020-12-30 DIAGNOSIS — M158 Other polyosteoarthritis: Secondary | ICD-10-CM

## 2020-12-30 NOTE — Telephone Encounter (Signed)
Pt. Daughter called LM stating they are trying to get her a wheel chair per her ins. Company they need PA from her doctor's office for this wheel chair. Wasn't sure if she would need an apt. For this or dies she just need you to order a wheel chair on a prescription pad to a medical supply company.

## 2020-12-31 NOTE — Telephone Encounter (Signed)
Pt. Daughter aware referral has been put in for PT to evaluate the need for a wheelchair.

## 2021-01-06 ENCOUNTER — Ambulatory Visit: Payer: Medicare HMO | Attending: Family Medicine | Admitting: Physical Therapy

## 2021-01-06 ENCOUNTER — Other Ambulatory Visit: Payer: Self-pay

## 2021-01-06 DIAGNOSIS — M6281 Muscle weakness (generalized): Secondary | ICD-10-CM | POA: Diagnosis not present

## 2021-01-06 DIAGNOSIS — R2689 Other abnormalities of gait and mobility: Secondary | ICD-10-CM | POA: Diagnosis not present

## 2021-01-06 DIAGNOSIS — M158 Other polyosteoarthritis: Secondary | ICD-10-CM | POA: Diagnosis not present

## 2021-01-06 DIAGNOSIS — R2681 Unsteadiness on feet: Secondary | ICD-10-CM | POA: Diagnosis not present

## 2021-01-06 DIAGNOSIS — R29898 Other symptoms and signs involving the musculoskeletal system: Secondary | ICD-10-CM | POA: Diagnosis not present

## 2021-01-07 ENCOUNTER — Encounter: Payer: Self-pay | Admitting: Physical Therapy

## 2021-01-07 NOTE — Therapy (Signed)
[facility] [address] [place], [place], [address] Phone: [phone]   Fax:  [phone]  Physical Therapy Evaluation  Patient Details  Name: [name] [name] MRN: [id number] Date of Birth: Jun 30, 1931 Referring Provider (PT): Dr. Jill Alexanders   Encounter Date: 01/06/2021   PT End of Session - 01/07/21 1043     Visit Number 1    Authorization Type [employer]    Authorization Time Period 01-06-21 - 02-05-21    PT Start Time 1448    PT Stop Time 1525    PT Time Calculation (min) 37 min    Activity Tolerance Patient tolerated treatment well    Behavior During Therapy [facility] for tasks assessed/performed             Past Medical History:  Diagnosis Date   Allergy    Arthritis    Asthma    Cataract    Diverticulosis    Dyslipidemia    GERD (gastroesophageal reflux disease)    Hemorrhoids    History of atrial fibrillation    Hypertension    Menopause    Obesity    PVD (peripheral vascular disease) ([place])     Past Surgical History:  Procedure Laterality Date   ABDOMINAL AORTAGRAM N/A 02/09/2012   Procedure: ABDOMINAL Maxcine Ham;  Surgeon: Elam Dutch, MD;  [facility] C[facility] LAB;  Service: Cardiovascular;  Laterality: N/A;   COLONOSCOPY  02/2004   EYE SURGERY     CATARACT (BILATERAL)   HERNIA REPAIR     abdominal x 2   right femoral to anterior tibial bypass  2004   SHOULDER SURGERY     age 14 due to dislocation   TEMPORAL ARTERY BIOPSY / LIGATION      There were no vitals filed for this visit.    Subjective Assessment - 01/07/21 1033     Subjective Pt presents to PT for evaluation for manual wheelchair - accompanied by her daughter; pt is using rollator for assistance with amb. - has unsteady & slow gait pattern    Patient is accompained by: Family member    Pertinent History OA of multiple joints, h/o atrial fibrillation, cardiomegaly, HTN, PVD    Patient Stated Goals obtain manual wheelchair     Currently in Pain? Yes    Pain Score --   Pt unable to rate intensity - expresses mild pain in seated position (due to OA)   Pain Location --   generalized pain due to OA               [facility] PT Assessment - 01/07/21 0001       Assessment   Medical Diagnosis Osteoarthritis of multiple joints    Referring Provider (PT) Dr. Jill Alexanders    Onset Date/Surgical Date --   Referral date 12-30-20; approx. 6 months ago for onset of difficulty walking     Precautions   Precautions Fall      Balance Screen   Has the patient fallen in the past 6 months No    Has the patient had a decrease in activity level because of a fear of falling?  No    Is the patient reluctant to leave their home because of a fear of falling?  No      Prior Function   Level of Independence Independent with household mobility with device;Independent with basic ADLs      ROM / Strength   AROM / PROM / Strength Strength  Strength   Overall Strength Deficits    Overall Strength Comments pain with resistance due to OA      Transfers   Transfers Sit to Stand;Stand to Sit    Sit to Stand 5: Supervision    Stand to Sit 5: Supervision    Number of Reps Other reps (comment)   1     Ambulation/Gait   Ambulation/Gait Yes    Ambulation/Gait Assistance 5: Supervision;4: Min guard    Ambulation Distance (Feet) 50 Feet   from clinic lobby to exam room   Assistive device Rollator    Gait Pattern Step-through pattern    Ambulation Surface Level;Indoor      Balance   Balance Assessed Yes      Static Standing Balance   Static Standing - Balance Support Bilateral upper extremity supported    Static Standing - Level of Assistance 6: Modified independent (Device/Increase time)                        Objective measurements completed on examination: See above findings.      Please fax face to face visit note along with script for "lightweight manual wheelchair, size 16 x 16, with a cushion"  to [facility] - fax # [phone] Thank you!          PT Education - 01/07/21 1041     Education Details informed pt and daughter of process needed to obtain lightweight manual wheelchair - need face to face visit note from PCP and order for lightweight wheelchair; no PT documentation is required as pt is not wanting a custom or power wheelchair    Person(s) Educated Patient;Child(ren)    Methods Explanation    Comprehension Verbalized understanding              PT Short Term Goals - 01/07/21 1059       PT SHORT TERM GOAL #1   Title N/A               PT Long Term Goals - 01/07/21 1059       PT LONG TERM GOAL #1   Title N/A                    Plan - 01/07/21 1045     Clinical Impression Statement Pt is an 87 yr old lady with OA of multiple joints and with PVD.  PMH also includes h/o a-fib, cardiomegaly and HTN.  Pt would benefit from a lightweight manual wheelchair for increased community accessibility as she is unable to safely amb. prolonged distances, has decreased standing balance,  and is unable to ambulate with normal gait velocity.  Pt is currently using a rollator for assistance with amb. with heavy reliance on UE's for support.  Pt amb. approx. 61' from clinic lobby to treatment room with close supervision.  Pt's performance/ability fluctuates due to intensity of pain due to OA of multiple joints and also pt reports pain in bil. LE's, possibly due to PVD.   A manual wheelchair would increase independence and safety with ADL's and with household mobility when she has increased pain due to OA and PVD.  A manual wheelchair would greatly decrease her fall risk and also provide increased community accessibility as she is unable to amb. prolonged distances and is unable to amb. quickly, i.e. has decreased gait velocity resulting in decreased efficiency with gait.  Pt also has decreased standing balance and is unable to safely  stand unsupported.     Personal Factors and Comorbidities Comorbidity 2;Age;Time since onset of injury/illness/exacerbation;Transportation    Comorbidities h/o a-fib, HTN, PVD, cardiomegaly, OA of multiple joints    Examination-Activity Limitations Locomotion Level;Transfers;Bend;Carry;Stand;Squat;Stairs    Examination-Participation Restrictions Cleaning;Laundry;Shop;Meal Prep    Clinical Decision Making Low    PT Frequency One time visit    PT Treatment/Interventions Other (comment)   evaluation only (for manual wheelchair)   Recommended Other Services Order for manual wheelchair with face to face visit note from PCP to be completed in order for pt to obtain manual lightweight wheelchair (from [employer] [place]); size 16 x 16 needed    Consulted and Agree with Plan of Care Patient;Family member/caregiver    Family Member Consulted daughter             Patient will benefit from skilled therapeutic intervention in order to improve the following deficits and impairments:  Difficulty walking, Decreased balance, Decreased activity tolerance, Decreased strength  Visit Diagnosis: Other abnormalities of gait and mobility - Plan: PT plan of care cert/re-cert  Other symptoms and signs involving the musculoskeletal system - Plan: PT plan of care cert/re-cert  Muscle weakness (generalized) - Plan: PT plan of care cert/re-cert  Unsteadiness on feet - Plan: PT plan of care cert/re-cert     Problem List Patient Active Problem List   Diagnosis Date Noted   History of cataract extraction 10/12/2020   Gastroesophageal reflux disease 10/12/2020   Allergic rhinitis 10/12/2020   Alzheimer's disease ([place]) 10/12/2020   Mild obstructive sleep apnea-hypopnea syndrome 12/31/2018   Episodes of formed visual hallucinations 12/31/2018   OAB (overactive bladder) 09/21/2016   Atherosclerotic PVD with intermittent claudication ([place]) 03/21/2012   Hyperlipidemia 06/28/2011   Essential hypertension, benign 06/28/2011   Osteoarthritis  06/28/2011   Cardiomegaly 02/07/2011    Alda Lea, PT 01/07/2021, 11:10 AM  [facility] [address] [place], [place], [address] Phone: [phone]   Fax:  [phone]  Name: [name] [name] MRN: [id number] Date of Birth: 09-29-1931

## 2021-01-25 ENCOUNTER — Ambulatory Visit: Payer: Medicare HMO | Admitting: Adult Health

## 2021-02-12 ENCOUNTER — Inpatient Hospital Stay (HOSPITAL_COMMUNITY): Payer: Medicare HMO | Admitting: Anesthesiology

## 2021-02-12 ENCOUNTER — Other Ambulatory Visit: Payer: Self-pay

## 2021-02-12 ENCOUNTER — Emergency Department (HOSPITAL_COMMUNITY): Payer: Medicare HMO

## 2021-02-12 ENCOUNTER — Inpatient Hospital Stay (HOSPITAL_COMMUNITY): Payer: Medicare HMO

## 2021-02-12 ENCOUNTER — Encounter (HOSPITAL_COMMUNITY)
Admission: EM | Disposition: A | Discharge status: Skilled Nursing Facility | Payer: Self-pay | Source: Home / Self Care | Attending: Internal Medicine

## 2021-02-12 ENCOUNTER — Inpatient Hospital Stay (HOSPITAL_COMMUNITY)
Admission: EM | Admit: 2021-02-12 | Discharge: 2021-02-23 | DRG: 853 | Disposition: A | Discharge status: Skilled Nursing Facility | Payer: Medicare HMO | Attending: Student | Admitting: Student

## 2021-02-12 DIAGNOSIS — R579 Shock, unspecified: Secondary | ICD-10-CM

## 2021-02-12 DIAGNOSIS — I774 Celiac artery compression syndrome: Secondary | ICD-10-CM | POA: Diagnosis not present

## 2021-02-12 DIAGNOSIS — I2699 Other pulmonary embolism without acute cor pulmonale: Secondary | ICD-10-CM | POA: Diagnosis present

## 2021-02-12 DIAGNOSIS — G9341 Metabolic encephalopathy: Secondary | ICD-10-CM | POA: Diagnosis not present

## 2021-02-12 DIAGNOSIS — K219 Gastro-esophageal reflux disease without esophagitis: Secondary | ICD-10-CM | POA: Diagnosis present

## 2021-02-12 DIAGNOSIS — I82432 Acute embolism and thrombosis of left popliteal vein: Secondary | ICD-10-CM | POA: Diagnosis present

## 2021-02-12 DIAGNOSIS — Z809 Family history of malignant neoplasm, unspecified: Secondary | ICD-10-CM

## 2021-02-12 DIAGNOSIS — Z20822 Contact with and (suspected) exposure to covid-19: Secondary | ICD-10-CM | POA: Diagnosis present

## 2021-02-12 DIAGNOSIS — E785 Hyperlipidemia, unspecified: Secondary | ICD-10-CM | POA: Diagnosis present

## 2021-02-12 DIAGNOSIS — I10 Essential (primary) hypertension: Secondary | ICD-10-CM | POA: Diagnosis present

## 2021-02-12 DIAGNOSIS — L89312 Pressure ulcer of right buttock, stage 2: Secondary | ICD-10-CM | POA: Diagnosis not present

## 2021-02-12 DIAGNOSIS — N179 Acute kidney failure, unspecified: Secondary | ICD-10-CM | POA: Insufficient documentation

## 2021-02-12 DIAGNOSIS — L89150 Pressure ulcer of sacral region, unstageable: Secondary | ICD-10-CM | POA: Diagnosis not present

## 2021-02-12 DIAGNOSIS — Z7982 Long term (current) use of aspirin: Secondary | ICD-10-CM

## 2021-02-12 DIAGNOSIS — K8020 Calculus of gallbladder without cholecystitis without obstruction: Secondary | ICD-10-CM | POA: Diagnosis not present

## 2021-02-12 DIAGNOSIS — I82412 Acute embolism and thrombosis of left femoral vein: Secondary | ICD-10-CM | POA: Diagnosis not present

## 2021-02-12 DIAGNOSIS — M1990 Unspecified osteoarthritis, unspecified site: Secondary | ICD-10-CM | POA: Diagnosis present

## 2021-02-12 DIAGNOSIS — G309 Alzheimer's disease, unspecified: Secondary | ICD-10-CM | POA: Diagnosis present

## 2021-02-12 DIAGNOSIS — A419 Sepsis, unspecified organism: Principal | ICD-10-CM | POA: Diagnosis present

## 2021-02-12 DIAGNOSIS — I959 Hypotension, unspecified: Secondary | ICD-10-CM | POA: Diagnosis not present

## 2021-02-12 DIAGNOSIS — I248 Other forms of acute ischemic heart disease: Secondary | ICD-10-CM | POA: Diagnosis not present

## 2021-02-12 DIAGNOSIS — I2694 Multiple subsegmental pulmonary emboli without acute cor pulmonale: Secondary | ICD-10-CM

## 2021-02-12 DIAGNOSIS — I4891 Unspecified atrial fibrillation: Secondary | ICD-10-CM | POA: Diagnosis present

## 2021-02-12 DIAGNOSIS — N390 Urinary tract infection, site not specified: Secondary | ICD-10-CM | POA: Diagnosis present

## 2021-02-12 DIAGNOSIS — J9811 Atelectasis: Secondary | ICD-10-CM | POA: Diagnosis not present

## 2021-02-12 DIAGNOSIS — R5383 Other fatigue: Secondary | ICD-10-CM | POA: Diagnosis not present

## 2021-02-12 DIAGNOSIS — M6281 Muscle weakness (generalized): Secondary | ICD-10-CM | POA: Diagnosis not present

## 2021-02-12 DIAGNOSIS — Z781 Physical restraint status: Secondary | ICD-10-CM

## 2021-02-12 DIAGNOSIS — R197 Diarrhea, unspecified: Secondary | ICD-10-CM | POA: Diagnosis not present

## 2021-02-12 DIAGNOSIS — E876 Hypokalemia: Secondary | ICD-10-CM | POA: Diagnosis present

## 2021-02-12 DIAGNOSIS — E43 Unspecified severe protein-calorie malnutrition: Secondary | ICD-10-CM | POA: Diagnosis not present

## 2021-02-12 DIAGNOSIS — I2609 Other pulmonary embolism with acute cor pulmonale: Secondary | ICD-10-CM | POA: Diagnosis not present

## 2021-02-12 DIAGNOSIS — I82442 Acute embolism and thrombosis of left tibial vein: Secondary | ICD-10-CM | POA: Diagnosis present

## 2021-02-12 DIAGNOSIS — I272 Pulmonary hypertension, unspecified: Secondary | ICD-10-CM | POA: Diagnosis not present

## 2021-02-12 DIAGNOSIS — L89302 Pressure ulcer of unspecified buttock, stage 2: Secondary | ICD-10-CM | POA: Diagnosis not present

## 2021-02-12 DIAGNOSIS — I739 Peripheral vascular disease, unspecified: Secondary | ICD-10-CM | POA: Diagnosis present

## 2021-02-12 DIAGNOSIS — R000 Tachycardia, unspecified: Secondary | ICD-10-CM | POA: Diagnosis not present

## 2021-02-12 DIAGNOSIS — R6521 Severe sepsis with septic shock: Secondary | ICD-10-CM | POA: Diagnosis present

## 2021-02-12 DIAGNOSIS — K4090 Unilateral inguinal hernia, without obstruction or gangrene, not specified as recurrent: Secondary | ICD-10-CM | POA: Diagnosis not present

## 2021-02-12 DIAGNOSIS — L89152 Pressure ulcer of sacral region, stage 2: Secondary | ICD-10-CM | POA: Diagnosis present

## 2021-02-12 DIAGNOSIS — G4733 Obstructive sleep apnea (adult) (pediatric): Secondary | ICD-10-CM | POA: Diagnosis present

## 2021-02-12 DIAGNOSIS — Z743 Need for continuous supervision: Secondary | ICD-10-CM | POA: Diagnosis not present

## 2021-02-12 DIAGNOSIS — I700 Atherosclerosis of aorta: Secondary | ICD-10-CM | POA: Diagnosis not present

## 2021-02-12 DIAGNOSIS — I749 Embolism and thrombosis of unspecified artery: Secondary | ICD-10-CM | POA: Diagnosis not present

## 2021-02-12 DIAGNOSIS — I82402 Acute embolism and thrombosis of unspecified deep veins of left lower extremity: Secondary | ICD-10-CM | POA: Diagnosis not present

## 2021-02-12 DIAGNOSIS — R4182 Altered mental status, unspecified: Secondary | ICD-10-CM | POA: Diagnosis not present

## 2021-02-12 DIAGNOSIS — R778 Other specified abnormalities of plasma proteins: Secondary | ICD-10-CM | POA: Diagnosis not present

## 2021-02-12 DIAGNOSIS — R0689 Other abnormalities of breathing: Secondary | ICD-10-CM | POA: Diagnosis not present

## 2021-02-12 DIAGNOSIS — Z8249 Family history of ischemic heart disease and other diseases of the circulatory system: Secondary | ICD-10-CM

## 2021-02-12 DIAGNOSIS — D259 Leiomyoma of uterus, unspecified: Secondary | ICD-10-CM | POA: Diagnosis not present

## 2021-02-12 DIAGNOSIS — R0902 Hypoxemia: Secondary | ICD-10-CM | POA: Diagnosis not present

## 2021-02-12 DIAGNOSIS — F02A0 Dementia in other diseases classified elsewhere, mild, without behavioral disturbance, psychotic disturbance, mood disturbance, and anxiety: Secondary | ICD-10-CM | POA: Diagnosis present

## 2021-02-12 DIAGNOSIS — Z6823 Body mass index (BMI) 23.0-23.9, adult: Secondary | ICD-10-CM

## 2021-02-12 DIAGNOSIS — K439 Ventral hernia without obstruction or gangrene: Secondary | ICD-10-CM | POA: Diagnosis not present

## 2021-02-12 DIAGNOSIS — L8990 Pressure ulcer of unspecified site, unspecified stage: Secondary | ICD-10-CM | POA: Insufficient documentation

## 2021-02-12 DIAGNOSIS — Z810 Family history of intellectual disabilities: Secondary | ICD-10-CM

## 2021-02-12 DIAGNOSIS — F0390 Unspecified dementia without behavioral disturbance: Secondary | ICD-10-CM | POA: Diagnosis not present

## 2021-02-12 DIAGNOSIS — R404 Transient alteration of awareness: Secondary | ICD-10-CM | POA: Diagnosis not present

## 2021-02-12 DIAGNOSIS — D6489 Other specified anemias: Secondary | ICD-10-CM | POA: Diagnosis present

## 2021-02-12 HISTORY — PX: RADIOLOGY WITH ANESTHESIA: SHX6223

## 2021-02-12 LAB — CBC WITH DIFFERENTIAL/PLATELET
Abs Immature Granulocytes: 0.09 10*3/uL — ABNORMAL HIGH (ref 0.00–0.07)
Basophils Absolute: 0 10*3/uL (ref 0.0–0.1)
Basophils Relative: 0 %
Eosinophils Absolute: 0 10*3/uL (ref 0.0–0.5)
Eosinophils Relative: 0 %
HCT: 44.7 % (ref 36.0–46.0)
Hemoglobin: 14.6 g/dL (ref 12.0–15.0)
Immature Granulocytes: 1 %
Lymphocytes Relative: 13 %
Lymphs Abs: 1.6 10*3/uL (ref 0.7–4.0)
MCH: 33.1 pg (ref 26.0–34.0)
MCHC: 32.7 g/dL (ref 30.0–36.0)
MCV: 101.4 fL — ABNORMAL HIGH (ref 80.0–100.0)
Monocytes Absolute: 1.1 10*3/uL — ABNORMAL HIGH (ref 0.1–1.0)
Monocytes Relative: 9 %
Neutro Abs: 9.6 10*3/uL — ABNORMAL HIGH (ref 1.7–7.7)
Neutrophils Relative %: 77 %
Platelets: 180 10*3/uL (ref 150–400)
RBC: 4.41 MIL/uL (ref 3.87–5.11)
RDW: 12.8 % (ref 11.5–15.5)
WBC: 12.4 10*3/uL — ABNORMAL HIGH (ref 4.0–10.5)
nRBC: 0 % (ref 0.0–0.2)

## 2021-02-12 LAB — LACTIC ACID, PLASMA
Lactic Acid, Venous: 2 mmol/L (ref 0.5–1.9)
Lactic Acid, Venous: 4.4 mmol/L (ref 0.5–1.9)

## 2021-02-12 LAB — LIPASE, BLOOD: Lipase: 22 U/L (ref 11–51)

## 2021-02-12 LAB — COMPREHENSIVE METABOLIC PANEL
ALT: 14 U/L (ref 0–44)
AST: 29 U/L (ref 15–41)
Albumin: 3.1 g/dL — ABNORMAL LOW (ref 3.5–5.0)
Alkaline Phosphatase: 58 U/L (ref 38–126)
Anion gap: 13 (ref 5–15)
BUN: 31 mg/dL — ABNORMAL HIGH (ref 8–23)
CO2: 20 mmol/L — ABNORMAL LOW (ref 22–32)
Calcium: 9.3 mg/dL (ref 8.9–10.3)
Chloride: 105 mmol/L (ref 98–111)
Creatinine, Ser: 1.3 mg/dL — ABNORMAL HIGH (ref 0.44–1.00)
GFR, Estimated: 40 mL/min — ABNORMAL LOW (ref >60)
Glucose, Bld: 112 mg/dL — ABNORMAL HIGH (ref 70–99)
Potassium: 3.4 mmol/L — ABNORMAL LOW (ref 3.5–5.1)
Sodium: 138 mmol/L (ref 135–145)
Total Bilirubin: 1.7 mg/dL — ABNORMAL HIGH (ref 0.3–1.2)
Total Protein: 7.4 g/dL (ref 6.5–8.1)

## 2021-02-12 LAB — URINALYSIS, ROUTINE W REFLEX MICROSCOPIC
Glucose, UA: 100 mg/dL — AB
Hgb urine dipstick: NEGATIVE
Ketones, ur: 15 mg/dL — AB
Leukocytes,Ua: NEGATIVE
Nitrite: POSITIVE — AB
Protein, ur: 100 mg/dL — AB
Specific Gravity, Urine: >1.03 — ABNORMAL HIGH (ref 1.005–1.030)
pH: 5.5 (ref 5.0–8.0)

## 2021-02-12 LAB — RESP PANEL BY RT-PCR (FLU A&B, COVID) ARPGX2
Influenza A by PCR: NEGATIVE
Influenza B by PCR: NEGATIVE
SARS Coronavirus 2 by RT PCR: NEGATIVE

## 2021-02-12 LAB — APTT: aPTT: 30 seconds (ref 24–36)

## 2021-02-12 LAB — URINALYSIS, MICROSCOPIC (REFLEX)

## 2021-02-12 LAB — TROPONIN I (HIGH SENSITIVITY): Troponin I (High Sensitivity): 853 ng/L (ref <18)

## 2021-02-12 LAB — PROTIME-INR
INR: 1.3 — ABNORMAL HIGH (ref 0.8–1.2)
Prothrombin Time: 16.6 seconds — ABNORMAL HIGH (ref 11.4–15.2)

## 2021-02-12 LAB — BRAIN NATRIURETIC PEPTIDE: B Natriuretic Peptide: 366.8 pg/mL — ABNORMAL HIGH (ref 0.0–100.0)

## 2021-02-12 SURGERY — IR WITH ANESTHESIA
Anesthesia: Monitor Anesthesia Care

## 2021-02-12 MED ORDER — HEPARIN SODIUM (PORCINE) 1000 UNIT/ML IJ SOLN
INTRAMUSCULAR | Status: DC | PRN
  Administered 2021-02-12: 1000 [IU] via INTRAVENOUS

## 2021-02-12 MED ORDER — DEXMEDETOMIDINE HCL IN NACL 400 MCG/100ML IV SOLN
INTRAVENOUS | Status: DC | PRN
  Administered 2021-02-12: .7 ug/kg/h via INTRAVENOUS

## 2021-02-12 MED ORDER — VANCOMYCIN HCL IN DEXTROSE 1-5 GM/200ML-% IV SOLN
1000.0000 mg | Freq: Once | INTRAVENOUS | Status: AC
  Administered 2021-02-12: 1000 mg via INTRAVENOUS
  Filled 2021-02-12: qty 200

## 2021-02-12 MED ORDER — NOREPINEPHRINE 4 MG/250ML-% IV SOLN
INTRAVENOUS | Status: AC
  Administered 2021-02-12: 4 ug/min via INTRAVENOUS
  Filled 2021-02-12: qty 250

## 2021-02-12 MED ORDER — VASOPRESSIN 20 UNIT/ML IV SOLN
INTRAVENOUS | Status: AC
  Filled 2021-02-12: qty 1

## 2021-02-12 MED ORDER — ACETAMINOPHEN 325 MG PO TABS
650.0000 mg | ORAL_TABLET | Freq: Four times a day (QID) | ORAL | Status: DC | PRN
  Administered 2021-02-16 – 2021-02-17 (×3): 650 mg via ORAL
  Filled 2021-02-12 (×3): qty 2

## 2021-02-12 MED ORDER — HEPARIN BOLUS VIA INFUSION
4000.0000 [IU] | Freq: Once | INTRAVENOUS | Status: AC
  Administered 2021-02-12: 4000 [IU] via INTRAVENOUS
  Filled 2021-02-12: qty 4000

## 2021-02-12 MED ORDER — HEPARIN SODIUM (PORCINE) 1000 UNIT/ML IJ SOLN
INTRAMUSCULAR | Status: AC
  Filled 2021-02-12: qty 10

## 2021-02-12 MED ORDER — SODIUM CHLORIDE 0.9 % IV SOLN
2.0000 g | INTRAVENOUS | Status: DC
  Administered 2021-02-13: 2 g via INTRAVENOUS
  Filled 2021-02-12: qty 2

## 2021-02-12 MED ORDER — LIDOCAINE HCL 1 % IJ SOLN
INTRAMUSCULAR | Status: AC
  Filled 2021-02-12: qty 20

## 2021-02-12 MED ORDER — POLYETHYLENE GLYCOL 3350 17 G PO PACK: 17.0000 g | PACK | Freq: Every day | ORAL | Status: DC | PRN

## 2021-02-12 MED ORDER — SODIUM CHLORIDE 0.9 % IV SOLN
2.0000 g | Freq: Once | INTRAVENOUS | Status: AC
  Administered 2021-02-12: 2 g via INTRAVENOUS
  Filled 2021-02-12: qty 2

## 2021-02-12 MED ORDER — VANCOMYCIN HCL 1250 MG/250ML IV SOLN
1250.0000 mg | INTRAVENOUS | Status: DC
  Administered 2021-02-14: 1250 mg via INTRAVENOUS
  Filled 2021-02-12: qty 250

## 2021-02-12 MED ORDER — SODIUM CHLORIDE 0.9 % IV SOLN
250.0000 mL | INTRAVENOUS | Status: DC
  Administered 2021-02-12 – 2021-02-15 (×2): 250 mL via INTRAVENOUS

## 2021-02-12 MED ORDER — SODIUM CHLORIDE 0.9 % IV SOLN: INTRAVENOUS | Status: DC | PRN

## 2021-02-12 MED ORDER — DOCUSATE SODIUM 100 MG PO CAPS: 100.0000 mg | ORAL_CAPSULE | Freq: Two times a day (BID) | ORAL | Status: DC | PRN

## 2021-02-12 MED ORDER — NOREPINEPHRINE 4 MG/250ML-% IV SOLN
0.0000 ug/min | INTRAVENOUS | Status: DC
Start: 2021-02-12 — End: 2021-02-12

## 2021-02-12 MED ORDER — LACTATED RINGERS IV SOLN: INTRAVENOUS | Status: AC

## 2021-02-12 MED ORDER — DEXMEDETOMIDINE HCL IN NACL 400 MCG/100ML IV SOLN
INTRAVENOUS | Status: AC
  Filled 2021-02-12: qty 100

## 2021-02-12 MED ORDER — LACTATED RINGERS IV BOLUS (SEPSIS)
1000.0000 mL | Freq: Once | INTRAVENOUS | Status: AC
  Administered 2021-02-12: 1000 mL via INTRAVENOUS

## 2021-02-12 MED ORDER — METRONIDAZOLE 500 MG/100ML IV SOLN
500.0000 mg | Freq: Once | INTRAVENOUS | Status: AC
  Administered 2021-02-12: 500 mg via INTRAVENOUS
  Filled 2021-02-12: qty 100

## 2021-02-12 MED ORDER — PANTOPRAZOLE SODIUM 40 MG IV SOLR
40.0000 mg | Freq: Every day | INTRAVENOUS | Status: DC
  Administered 2021-02-13: 40 mg via INTRAVENOUS
  Filled 2021-02-12: qty 40

## 2021-02-12 MED ORDER — VASOPRESSIN 20 UNIT/ML IV SOLN
INTRAVENOUS | Status: DC | PRN
  Administered 2021-02-12: 2 [IU] via INTRAVENOUS
  Administered 2021-02-12 (×2): 1 [IU] via INTRAVENOUS

## 2021-02-12 MED ORDER — HEPARIN (PORCINE) 25000 UT/250ML-% IV SOLN
950.0000 [IU]/h | INTRAVENOUS | Status: AC
  Administered 2021-02-12: 21:00:00 1000 [IU]/h via INTRAVENOUS
  Administered 2021-02-13: 21:00:00 800 [IU]/h via INTRAVENOUS
  Administered 2021-02-15: 950 [IU]/h via INTRAVENOUS
  Filled 2021-02-12 (×3): qty 250

## 2021-02-12 MED ORDER — IOHEXOL 350 MG/ML SOLN
80.0000 mL | Freq: Once | INTRAVENOUS | Status: AC | PRN
  Administered 2021-02-12: 80 mL via INTRAVENOUS

## 2021-02-12 MED ORDER — NOREPINEPHRINE 4 MG/250ML-% IV SOLN
2.0000 ug/min | INTRAVENOUS | Status: DC
  Administered 2021-02-12 – 2021-02-13 (×3): 10 ug/min via INTRAVENOUS
  Administered 2021-02-13: 6 ug/min via INTRAVENOUS
  Administered 2021-02-14: 3 ug/min via INTRAVENOUS
  Filled 2021-02-12 (×4): qty 250

## 2021-02-12 MED ORDER — ACETAMINOPHEN 650 MG RE SUPP
650.0000 mg | Freq: Once | RECTAL | Status: AC
  Administered 2021-02-12: 650 mg via RECTAL
  Filled 2021-02-12: qty 1

## 2021-02-12 NOTE — ED Provider Notes (Signed)
[name] Provider Note   CSN: [id number] Arrival date & time: 02/12/21  1508     History No chief complaint on file.   [name] is a 87 y.o. female who presents from home with concern for declining mental status for the last week.  Patient with mild dementia at [name], [name] family states that she has become less communicative than normal and today has been febrile and lethargic.  Endorses new wounds to the buttocks.  Per family, caretaker not very attentive.  5 caveat due to underlying acuity of patient's presentation upon arrival.  I personally read this patient's medical records.  He has history of PVD with intermittent claudication, sleep apnea, GERD, Alzheimer's, and cardiomegaly.  She is on daily baby aspirin but is otherwise not anticoagulated.  HPI     Past Medical History:  Diagnosis Date   Allergy    Arthritis    Asthma    Cataract    Diverticulosis    Dyslipidemia    GERD (gastroesophageal reflux disease)    Hemorrhoids    History of atrial fibrillation    Hypertension    Menopause    Obesity    PVD (peripheral vascular disease) [facility])     Patient Active Problem List   Diagnosis Date Noted   Pulmonary embolism ([place]) 02/12/2021   History of cataract extraction 10/12/2020   Gastroesophageal reflux disease 10/12/2020   Allergic rhinitis 10/12/2020   Alzheimer's disease ([place]) 10/12/2020   Mild obstructive sleep apnea-hypopnea syndrome 12/31/2018   Episodes of formed visual hallucinations 12/31/2018   OAB (overactive bladder) 09/21/2016   Atherosclerotic PVD with intermittent claudication ([place]) 03/21/2012   Hyperlipidemia 06/28/2011   Essential hypertension, benign 06/28/2011   Osteoarthritis 06/28/2011   Cardiomegaly 02/07/2011    Past Surgical History:  Procedure Laterality Date   ABDOMINAL AORTAGRAM N/A 02/09/2012   Procedure: ABDOMINAL Maxcine Ham;  Surgeon: Elam Dutch, MD;  Location: [facility] CATH  LAB;  Service: Cardiovascular;  Laterality: N/A;   COLONOSCOPY  02/2004   EYE SURGERY     CATARACT (BILATERAL)   HERNIA REPAIR     abdominal x 2   right femoral to anterior tibial bypass  2004   SHOULDER SURGERY     age 80 due to dislocation   TEMPORAL ARTERY BIOPSY / LIGATION       OB History   No obstetric history on file.     Family History  Problem Relation Age of Onset   Cancer Sister    Mental retardation Paternal Aunt    Heart disease Son        died age 52yo   Hyperlipidemia Son    Heart attack Son     Social History   Tobacco Use   Smoking status: Never   Smokeless tobacco: Never  Vaping Use   Vaping Use: Never used  Substance Use Topics   Alcohol use: No   Drug use: No    Home Medications Prior to Admission medications   Medication Sig Start Date End Date Taking? Authorizing Provider  acetaminophen (TYLENOL) 650 MG CR tablet Take 650-1,300 mg by mouth every 8 (eight) hours as needed (arthritis pain).   Yes [provider]  aspirin EC 81 MG tablet Take 81 mg by mouth daily.   Yes [provider]  atenolol-chlorthalidone (TENORETIC) 100-25 MG tablet Take 1 tablet by mouth daily. 10/12/20  Yes Denita Lung, MD  ELDERBERRY PO Take 1 tablet by mouth daily.  Yes [provider]  memantine (NAMENDA XR) 14 MG CP24 24 hr capsule Take 1 capsule (14 mg total) by mouth daily. 07/13/20  Yes Ward Givens, NP  Multiple Vitamin (MULITIVITAMIN WITH MINERALS) TABS Take 1 tablet by mouth daily.   Yes [provider]  Pyridoxine HCl (VITAMIN B-6 PO) Take 1 tablet by mouth daily.   Yes [provider]  simvastatin (ZOCOR) 40 MG tablet Take 1 tablet (40 mg total) by mouth at bedtime. 10/12/20  Yes Denita Lung, MD  spironolactone (ALDACTONE) 25 MG tablet Take 1 tablet (25 mg total) by mouth daily. 10/12/20  Yes Denita Lung, MD  triamcinolone cream (KENALOG) 0.5 % APPLY TOPICALLY 3 TIMES  DAILY Patient taking differently:  Apply 1 application topically 3 (three) times daily as needed (rash). APPLY TOPICALLY 3 TIMES  DAILY 06/10/19  Yes Denita Lung, MD    Allergies    Patient has no known allergies.  Review of Systems   Review of Systems  Unable to perform ROS: Dementia   Physical Exam Updated Vital Signs BP 113/67   Pulse (!) 110   Temp (!) 104.1 F (40.1 C) (Rectal)   Resp (!) 29   SpO2 96%   Physical Exam Vitals and nursing note reviewed. Exam conducted with a chaperone present.  Constitutional:      Appearance: She is toxic-appearing.     Comments: Febrile on intake, 104.1 degrees Fahrenheit rectally.   HENT:     Head: Normocephalic and atraumatic.     Nose: Nose normal. No congestion.     Mouth/Throat:     Mouth: Mucous membranes are dry.     Pharynx: Oropharynx is clear. Uvula midline. No oropharyngeal exudate or posterior oropharyngeal erythema.  Eyes:     General: Lids are normal. Vision grossly intact.        Right eye: No discharge.        Left eye: No discharge.     Extraocular Movements: Extraocular movements intact.     Conjunctiva/sclera: Conjunctivae normal.     Pupils: Pupils are equal, round, and reactive to light.  Neck:     Trachea: Trachea and phonation normal.  Cardiovascular:     Rate and Rhythm: Regular rhythm. Tachycardia present.     Pulses:          Dorsalis pedis pulses are 1+ on the right side and 1+ on the left side.     Heart sounds: Normal heart sounds. No murmur heard. Pulmonary:     Effort: Pulmonary effort is normal. No tachypnea, bradypnea, accessory muscle usage, prolonged expiration or respiratory distress.     Breath sounds: Decreased air movement present. Examination of the right-lower field reveals decreased breath sounds. Examination of the left-lower field reveals decreased breath sounds. Decreased breath sounds present. No wheezing or rales.  Chest:     Chest wall: No mass, lacerations, deformity, swelling, tenderness, crepitus or edema.   Abdominal:     General: Bowel sounds are normal. There is no distension.     Palpations: Abdomen is soft.     Tenderness: There is no right CVA tenderness, left CVA tenderness, guarding or rebound.  Genitourinary:    General: Normal vulva.     Exam position: Knee-chest position.  Musculoskeletal:        General: No deformity.     Cervical back: Normal range of motion and neck supple. No edema, rigidity, tenderness or crepitus. No pain with movement, spinous process tenderness or muscular tenderness.  Back:     Right lower leg: No edema.     Left lower leg: No edema.  Lymphadenopathy:     Cervical: No cervical adenopathy.  Skin:    General: Skin is warm and dry.     Capillary Refill: Capillary refill takes less than 2 seconds.  Neurological:     General: No focal deficit present.     Mental Status: She is lethargic.     GCS: GCS eye subscore is 2. GCS verbal subscore is 2. GCS motor subscore is 4.     Comments: Moving all 4 extremities  Psychiatric:        Mood and Affect: Mood normal.    ED Results / Procedures / Treatments   Labs (all labs ordered are listed, but only abnormal results are displayed) Labs Reviewed  COMPREHENSIVE METABOLIC PANEL - Abnormal; Notable for the following components:      Result Value   Potassium 3.4 (*)    CO2 20 (*)    Glucose, Bld 112 (*)    BUN 31 (*)    Creatinine, Ser 1.30 (*)    Albumin 3.1 (*)    Total Bilirubin 1.7 (*)    GFR, Estimated 40 (*)    All other components within normal limits  LACTIC ACID, PLASMA - Abnormal; Notable for the following components:   Lactic Acid, Venous 2.0 (*)    All other components within normal limits  CBC WITH DIFFERENTIAL/PLATELET - Abnormal; Notable for the following components:   WBC 12.4 (*)    MCV 101.4 (*)    Neutro Abs 9.6 (*)    Monocytes Absolute 1.1 (*)    Abs Immature Granulocytes 0.09 (*)    All other components within normal limits  PROTIME-INR - Abnormal; Notable for the  following components:   Prothrombin Time 16.6 (*)    INR 1.3 (*)    All other components within normal limits  URINALYSIS, ROUTINE W REFLEX MICROSCOPIC - Abnormal; Notable for the following components:   Specific Gravity, Urine >1.030 (*)    Glucose, UA 100 (*)    Bilirubin Urine SMALL (*)    Ketones, ur 15 (*)    Protein, ur 100 (*)    Nitrite POSITIVE (*)    All other components within normal limits  BRAIN NATRIURETIC PEPTIDE - Abnormal; Notable for the following components:   B Natriuretic Peptide 366.8 (*)    All other components within normal limits  URINALYSIS, MICROSCOPIC (REFLEX) - Abnormal; Notable for the following components:   Bacteria, UA PRESENT (*)    All other components within normal limits  RESP PANEL BY RT-PCR (FLU A&B, COVID) ARPGX2  CULTURE, BLOOD (ROUTINE X 2)  CULTURE, BLOOD (ROUTINE X 2)  URINE CULTURE  APTT  LIPASE, BLOOD  LACTIC ACID, PLASMA  HEPARIN LEVEL (UNFRACTIONATED)  CBC  BASIC METABOLIC PANEL  MAGNESIUM  PHOSPHORUS  TROPONIN I (HIGH SENSITIVITY)  TROPONIN I (HIGH SENSITIVITY)   EKG None  Radiology DG Chest Port 1 View  Result Date: 02/12/2021 CLINICAL DATA:  Sepsis EXAM: PORTABLE CHEST 1 VIEW COMPARISON:  None. FINDINGS: Normal cardiac silhouette. Widened upper mediastinum suggest ectatic aorta. Lungs are clear. No effusion, infiltrate pneumothorax. No acute osseous abnormality. IMPRESSION: No clear acute cardiopulmonary findings. Widened mediastinum favored ectatic aorta. Electronically Signed   By: Suzy Bouchard M.D.   On: 02/12/2021 16:36   CT Angio Chest/Abd/Pel for Dissection W and/or W/WO  Result Date: 02/12/2021 CLINICAL DATA:  Widened mediastinum EXAM: CT ANGIOGRAPHY CHEST, ABDOMEN  AND PELVIS TECHNIQUE: Non-contrast CT of the chest was initially obtained. Multidetector CT imaging through the chest, abdomen and pelvis was performed using the standard protocol during bolus administration of intravenous contrast. Multiplanar  reconstructed images and MIPs were obtained and reviewed to evaluate the vascular anatomy. CONTRAST:  51mL OMNIPAQUE IOHEXOL 350 MG/ML SOLN COMPARISON:  None. FINDINGS: CTA CHEST FINDINGS Cardiovascular: Pulmonary embolus seen in the right and left pulmonary arteries which extends into the lobar arteries and multiple bilateral segmental and subsegmental pulmonary arteries. Elevated RV to LV ratio. No pericardial effusion. Mediastinum/Nodes: Patulous esophagus. No pathologically enlarged lymph nodes seen in the chest. Lungs/Pleura: Central airways are patent. Bibasilar atelectasis. No consolidation, pleural effusion or pneumothorax. Musculoskeletal: No chest wall abnormality. No acute or significant osseous findings. Review of the MIP images confirms the above findings. CTA ABDOMEN AND PELVIS FINDINGS VASCULAR Aorta: Normal caliber thoracic aorta with no evidence of dissection or intramural hematoma. Heterogeneous opacification of the distal aortic arch and proximal thoracic aorta, likely due to mixing artifact. Standard 3 vessel aortic arch with patent arch vessels. Mild to moderate calcified and noncalcified plaque. Celiac: Patent without evidence of aneurysm, dissection, or vasculitis. Severe narrowing of the proximal celiac artery, likely due to diaphragmatic compression. SMA: Patent without evidence of aneurysm, dissection, vasculitis or significant stenosis. Renals: Both renal arteries are patent without evidence of aneurysm, dissection, vasculitis, fibromuscular dysplasia or significant stenosis. Accessory left renal artery to the upper pole. IMA: Patent without evidence of aneurysm, dissection, vasculitis or significant stenosis. Inflow: Patent without evidence of aneurysm, dissection, vasculitis or significant stenosis. Mild scattered calcified plaque. Veins: Reflux of contrast into the hepatic veins, which can be seen in the setting of right heart dysfunction. Review of the MIP images confirms the above  findings. NON-VASCULAR Hepatobiliary: No focal liver abnormality is seen. Cholelithiasis. Layering high density material seen in the gallbladder, likely due to sludge. No gallbladder wall thickening. Pancreas: Unremarkable. No pancreatic ductal dilatation or surrounding inflammatory changes. Spleen: Normal in size without focal abnormality. Adrenals/Urinary Tract: Bilateral adrenal glands are unremarkable. No hydronephrosis. Low-density lesion of the mid region of the left kidney, likely a simple cyst. Air is seen in the urinary bladder. Stomach/Bowel: Stomach is within normal limits. Appendix is not visualized. No evidence of bowel wall thickening, distention, or inflammatory changes. Lymphatic: No pathologically enlarged lymph nodes seen in the abdomen or pelvis. Reproductive: Multiple calcified fibroids in the uterus. Other: Small fat containing hernia of the ventral abdominal wall. Small right inguinal hernia containing nondilated loops of bowel. No abdominopelvic ascites. Musculoskeletal: No acute or significant osseous findings. Review of the MIP images confirms the above findings. IMPRESSION: 1. Pulmonary embolus seen in the right and left pulmonary arteries, lobar arteries and multiple bilateral segmental and subsegmental pulmonary arteries. 2. Elevated RV to LV ratio, compatible with right heart strain. 3. No evidence of acute aortic syndrome. Heterogeneous opacification of the distal aortic arch and proximal descending thoracic aorta is likely due to mixing artifact. 4. Air is seen in the urinary bladder, correlate for history of recent instrumentation. Findings can also be seen in the setting of infection. 5.  Aortic Atherosclerosis (ICD10-I70.0). Critical Value/emergent results were called by telephone at the time of interpretation on 02/12/2021 at 6:19 pm to provider [facility] , who verbally acknowledged these results. Electronically Signed   By: Yetta Glassman M.D.   On: 02/12/2021 18:35     Procedures .Critical Care Performed by: Emeline Darling, PA-C Authorized by: Emeline Darling, PA-C  Critical care provider statement:    Critical care time (minutes):  60   Critical care was necessary to treat or prevent imminent or life-threatening deterioration of the following conditions:  Circulatory failure and sepsis   Critical care was time spent personally by me on the following activities:  Development of treatment plan with patient or surrogate, discussions with consultants, evaluation of patient's response to treatment, examination of patient, obtaining history from patient or surrogate, ordering and performing treatments and interventions, ordering and review of laboratory studies, ordering and review of radiographic studies, pulse oximetry and re-evaluation of patient's condition   Medications Ordered in ED Medications  lactated ringers infusion ( Intravenous New Bag/Given 02/12/21 1609)  vancomycin (VANCOREADY) IVPB 1250 mg/250 mL (has no administration in time range)  ceFEPIme (MAXIPIME) 2 g in sodium chloride 0.9 % 100 mL IVPB (has no administration in time range)  heparin ADULT infusion 100 units/mL (25000 units/243mL) (1,000 Units/hr Intravenous New Bag/Given 02/12/21 2106)  docusate sodium (COLACE) capsule 100 mg (has no administration in time range)  polyethylene glycol (MIRALAX / GLYCOLAX) packet 17 g (has no administration in time range)  pantoprazole (PROTONIX) injection 40 mg (has no administration in time range)  0.9 %  sodium chloride infusion (has no administration in time range)  norepinephrine (LEVOPHED) 4mg  in 252mL (0.016 mg/mL) premix infusion (has no administration in time range)  lactated ringers bolus 1,000 mL (0 mLs Intravenous Stopped 02/12/21 1640)    And  lactated ringers bolus 1,000 mL (1,000 mLs Intravenous New Bag/Given 02/12/21 1707)  ceFEPIme (MAXIPIME) 2 g in sodium chloride 0.9 % 100 mL IVPB (0 g Intravenous Stopped 02/12/21 1644)   metroNIDAZOLE (FLAGYL) IVPB 500 mg (0 mg Intravenous Stopped 02/12/21 1720)  vancomycin (VANCOCIN) IVPB 1000 mg/200 mL premix (1,000 mg Intravenous New Bag/Given 02/12/21 1708)  acetaminophen (TYLENOL) suppository 650 mg (650 mg Rectal Given 02/12/21 1620)  iohexol (OMNIPAQUE) 350 MG/ML injection 80 mL (80 mLs Intravenous Contrast Given 02/12/21 1755)  heparin bolus via infusion 4,000 Units (4,000 Units Intravenous Bolus from Bag 02/12/21 2106)    ED Course  I have reviewed the triage vital signs and the nursing notes.  Pertinent labs & imaging results that were available during my care of the patient were reviewed by me and considered in my medical decision making (see chart for details).  Clinical Course as of 02/12/21 2125  Sat Feb 12, 2021  1819 Critical result called from radiologist Dr. Burt Ek with report of bilateral pulmonary emboli in the distal pulmonary arteries with evidence of right heart strain without acute aortic syndrome.  She is here to review the abdominal portion of the CT and will call back with any further critical results or will include documentation in the impression of incidental findings. [RS]  1917 Consult to critical care physician who is agreeable to seeing this patient and admitting her to his service.  He did request interventional radiology consult for evaluation of possible IR procedure to relieve clot burden and right heart strain.  This has been placed.  I appreciate his collaboration in the care of this patient. [RS]  1919 After many attempts to contact family, I did receive a call back from the patient's daughter Irena Reichmann, who is the patient's next of kin.  She states that she has discussed with her mother being her medical power of attorney but does not have any documentation completed for this. Per daughter, the patient started with live-in caretaker on 01-16-21; at that time she could walk  with walker and perform many ADLs. Has declined  significantly since that time, now listless, unable to stand. A&O x 4 at [name], A&O x 0 today, prompting ED visit.  She does request that all life-saving measures be performed for her mother.  Does want CPR and intubation if necessary.  I appreciate her collaboration in the care of this patient. [RS]  2018 Consult call received back from interventional radiologist, Dr. Maryelizabeth Kaufmann, who does feel procedure to offload burden of this patient's pulmonary embolus is appropriate.  Secure chat started by this provider including critical care team and interventional radiology who may proceed with coordination of procedure following receipt of consent from patient's next of kin.  I appreciate their collaboration care of this patient. [RS]    Clinical Course User Index [RS] Mesa Janus, Sharlene Dory   MDM Rules/Calculators/A&P                         87 year old female presents with concern for altered mental status x1 week.  The differential diagnosis for AMS is extensive and includes, but is not limited to:  Drug overdose - opioids, alcohol, sedatives, antipsychotics, drug withdrawal, others Metabolic: hypoxia, hypoglycemia, hyperglycemia, hypercalcemia, hypernatremia, hyponatremia, uremia, hepatic encephalopathy, hypothyroidism, hyperthyroidism, vitamin B12 or thiamine deficiency, carbon monoxide poisoning, Wilson's disease, Lactic acidosis, DKA/HHOS Infectious: meningitis, encephalitis, bacteremia/sepsis, urinary tract infection, pneumonia, neurosyphilis Structural: Space-occupying lesion, (brain tumor, subdural hematoma, hydrocephalus,) Vascular: stroke, subarachnoid hemorrhage, coronary ischemia, hypertensive encephalopathy, CNS vasculitis, thrombotic thrombocytopenic purpura, disseminated intravascular coagulation, hyperviscosity Psychiatric: Schizophrenia, depression; Other: Seizure, hypothermia, heat stroke, ICU psychosis, dementia -"sundowning."  Febrile to 104.1 F rectally on intake,  tachycardic, initially with blood pressure in the 90s of this gradually declined with SBP in the 70s and MAP in the 50s on the monitor.  Cardiopulmonary exam significant for decreased air movement in the bases, complicated by patient's poor participation.  Additionally tachycardic without irregular rhythm.  Abdomen is soft and nondistended, no edema of the extremities.  Patient moving all 4 extremities spontaneously.  Warm to the touch with dry mucous membranes.  Fluids and broad-spectrum antibiotics ordered per sepsis order set.  Code sepsis activated as patient was meeting septic criteria upon presentation.  CBC with leukocytosis of 12.9, CMP with mild hypokalemia of 3.4 , Creatinine of 1.3 increased from her [name] of 0.8 with elevated total bilirubin to 1.7.  UA concerning for infection, bilirubinuria, ketonuria, proteinuria, nitrite positive with bacteria present.  Lactic acid elevated to 2 and BNP mildly elevated to 366.  Respiratory pathogen panel is negative.   Chest x-ray did reveal significantly widened mediastinum concerning for ectatic aorta.  CT angio chest abdomen pelvis ordered in context of sepsis, hypotension, and widened mediastinum which revealed extensive bilateral pulmonary emboli involving the pulmonary arteries, lobar arteries, and multiple bilateral segmental and subsegmental pulmonary arteries with right heart strain without acute aortic syndrome.  Patient is requiring 14 mcg of Levophed at this time to maintain her maps above 60, critical care consulted as above.  Pending IR consult at this time. Per family patient is a FULL CODE.   This chart was dictated using voice recognition software, Dragon. Despite the best efforts of this provider to proofread and correct errors, errors may still occur which can change documentation meaning.  Final Clinical Impression(s) / ED Diagnoses Final diagnoses:  Sepsis ([place])    Rx / DC Orders ED Discharge Orders     None         Princessa Lesmeister,  Sharlene Dory 02/12/21 2125    Jeanell Sparrow, DO 02/13/21 [phone]

## 2021-02-12 NOTE — Progress Notes (Signed)
ANTICOAGULATION CONSULT NOTE - Initial Consult  Pharmacy Consult for heparin Indication: pulmonary embolus  No Known Allergies  Patient Measurements:   Heparin Dosing Weight: 60 kg   Vital Signs: Temp: 104.1 F (40.1 C) (12/10 1527) Temp Source: Rectal (12/10 1527) BP: 95/79 (12/10 1830) Pulse Rate: 120 (12/10 1830)  Labs: Recent Labs    02/12/21 1556  HGB 14.6  HCT 44.7  PLT 180  APTT 30  LABPROT 16.6*  INR 1.3*  CREATININE 1.30*    CrCl cannot be calculated (Unknown ideal weight.).   Medical History: Past Medical History:  Diagnosis Date   Allergy    Arthritis    Asthma    Cataract    Diverticulosis    Dyslipidemia    GERD (gastroesophageal reflux disease)    Hemorrhoids    History of atrial fibrillation    Hypertension    Menopause    Obesity    PVD (peripheral vascular disease) (HCC)     Medications:  (Not in a hospital admission)   Assessment: 39 YOF with h/o PVD here with declining mental status. A CT chest showed bilateral PE with R heart strain.   H/H and Plt wnl, SCr 1.3   Goal of Therapy:  Heparin level 0.3-0.7 units/ml Monitor platelets by anticoagulation protocol: Yes   Plan:  -Heparin 4000 units IV bolus followed by heparin infusion at 1000 units/hr -F/u 8 hr HL -Monitor daily HL, CBC and s/s of bleeding   Albertina Parr, PharmD., BCPS, BCCCP Clinical Pharmacist Please refer to [facility] for unit-specific pharmacist

## 2021-02-12 NOTE — Progress Notes (Signed)
An USGPIV (ultrasound guided PIV) has been placed for short-term vasopressor infusion. Correctly placed ivWatch must be used when administering Vasopressors. Should this treatment be needed beyond 72 hours, central line access should be obtained.  It will be the responsibility of the bedside nurse to follow best practice to prevent extravasations.   

## 2021-02-12 NOTE — Sedation Documentation (Addendum)
Pre PAP pressure: 26/14 Mean (18) Post PAP pressure: 24/4 Mean (14)

## 2021-02-12 NOTE — Consult Note (Signed)
Vascular and Interventional Radiology  PRE PROCEDURE H&P  Assessment  Plan:   [name] is a 87 y.o. year old female who will undergo CATHETER-DIRECTED PULMONARY THROMBECTOMY in Interventional Radiology.  The procedure has been fully reviewed with the patient/patient's authorized representative. The risks, benefits and alternatives have been explained, and the patient/patient's authorized representative has consented to the procedure. -- The patient will accept blood products in an emergent situation. -- The patient does not have a Do Not Resuscitate order in effect.  HPI: [name] is a 87 y.o. year old female comorbid w PMHx significant for Alzheimer Dx, PVD, and HTN / HLD on ASA 81mg . Pt presented to ER from Home with reported mental status change. Workup including CTA revealing bilateral pulmonary emboli with evidence or R heart strain. Critical Care recommended VIR evaluation for potential thrombectomy given large clot burden.  Imaging reviewed independently. RV/LV ratio 2.29. sPESI score 4, HIGH risk.   Informed consent was obtained, witnessed and placed in the patient's chart.  Allergies: No Known Allergies  Medications:  No current facility-administered medications on file prior to encounter.   Current Outpatient Medications on File Prior to Encounter  Medication Sig Dispense Refill   acetaminophen (TYLENOL) 650 MG CR tablet Take 650-1,300 mg by mouth every 8 (eight) hours as needed (arthritis pain).     aspirin EC 81 MG tablet Take 81 mg by mouth daily.     atenolol-chlorthalidone (TENORETIC) 100-25 MG tablet Take 1 tablet by mouth daily. 90 tablet 3   ELDERBERRY PO Take 1 tablet by mouth daily.     memantine (NAMENDA XR) 14 MG CP24 24 hr capsule Take 1 capsule (14 mg total) by mouth daily. 30 capsule 11   Multiple Vitamin (MULITIVITAMIN WITH MINERALS) TABS Take 1 tablet by mouth daily.     Pyridoxine HCl (VITAMIN B-6 PO) Take 1 tablet by mouth daily.      simvastatin (ZOCOR) 40 MG tablet Take 1 tablet (40 mg total) by mouth at bedtime. 90 tablet 3   spironolactone (ALDACTONE) 25 MG tablet Take 1 tablet (25 mg total) by mouth daily. 90 tablet 3   triamcinolone cream (KENALOG) 0.5 % APPLY TOPICALLY 3 TIMES  DAILY (Patient taking differently: Apply 1 application topically 3 (three) times daily as needed (rash). APPLY TOPICALLY 3 TIMES  DAILY) 240 g 3    PSH:  Past Surgical History:  Procedure Laterality Date   ABDOMINAL AORTAGRAM N/A 02/09/2012   Procedure: ABDOMINAL AORTAGRAM;  Surgeon: Elam Dutch, MD;  Location: [facility] CATH LAB;  Service: Cardiovascular;  Laterality: N/A;   COLONOSCOPY  02/2004   EYE SURGERY     CATARACT (BILATERAL)   HERNIA REPAIR     abdominal x 2   right femoral to anterior tibial bypass  2004   SHOULDER SURGERY     age 18 due to dislocation   TEMPORAL ARTERY BIOPSY / LIGATION      PMH:  Past Medical History:  Diagnosis Date   Allergy    Arthritis    Asthma    Cataract    Diverticulosis    Dyslipidemia    GERD (gastroesophageal reflux disease)    Hemorrhoids    History of atrial fibrillation    Hypertension    Menopause    Obesity    PVD (peripheral vascular disease) ([place])     Brief Physical Examination: Vitals:   02/12/21 2130 02/12/21 2137  BP: 121/65 121/65  Pulse:  (!) 110  Resp: [name])  30 (!) 29  Temp:    SpO2:  97%   General: WD, elderly female. Altered in restraints. HEENT: Normocephalic, atraumatic Lungs: Tachypneic  ASA Grade: Per Anesthesia Team Mallampati Class: Per Anesthesia Team   Michaelle Birks, MD [facility]   Pager. [place]

## 2021-02-12 NOTE — ED Notes (Signed)
Pressures dropping into the 70's.  2nd bolus started.  PA notified.

## 2021-02-12 NOTE — Anesthesia Preprocedure Evaluation (Addendum)
Anesthesia Evaluation  Patient identified by MRN, date of birth, ID band Patient confused  Preop documentation limited or incomplete due to emergent nature of procedure.  Airway Mallampati: II  TM Distance: <3 FB Neck ROM: Full    Dental no notable dental hx.    Pulmonary sleep apnea , PE Acute PE    + decreased breath sounds      Cardiovascular hypertension, + Peripheral Vascular Disease  + dysrhythmias Atrial Fibrillation  Rhythm:Irregular Rate:Tachycardia     Neuro/Psych Dementia negative neurological ROS     GI/Hepatic Neg liver ROS, GERD  Medicated,  Endo/Other  negative endocrine ROS  Renal/GU negative Renal ROS  negative genitourinary   Musculoskeletal  (+) Arthritis , Osteoarthritis,    Abdominal Normal abdominal exam  (+)   Peds  Hematology negative hematology ROS (+)   Anesthesia Other Findings   Reproductive/Obstetrics                            Anesthesia Physical Anesthesia Plan  ASA: 5 and emergent  Anesthesia Plan: MAC   Post-op Pain Management:    Induction: Intravenous  PONV Risk Score and Plan: 2 and Ondansetron and Treatment may vary due to age or medical condition  Airway Management Planned: Simple Face Mask, Natural Airway and Nasal Cannula  Additional Equipment: Arterial line  Intra-op Plan:   Post-operative Plan:   Informed Consent: I have reviewed the patients History and Physical, chart, labs and discussed the procedure including the risks, benefits and alternatives for the proposed anesthesia with the patient or authorized representative who has indicated his/her understanding and acceptance.     Dental advisory given  Plan Discussed with: CRNA  Anesthesia Plan Comments: (Lab Results      Component                Value               Date                      WBC                      12.4 (H)            02/12/2021                HGB                       14.6                02/12/2021                HCT                      44.7                02/12/2021                MCV                      101.4 (H)           02/12/2021                PLT                      180  02/12/2021           Lab Results      Component                Value               Date                      NA                       138                 02/12/2021                K                        3.4 (L)             02/12/2021                CO2                      20 (L)              02/12/2021                GLUCOSE                  112 (H)             02/12/2021                BUN                      31 (H)              02/12/2021                CREATININE               1.30 (H)            02/12/2021                CALCIUM                  9.3                 02/12/2021                EGFR                     77                  10/12/2020                GFRNONAA                 40 (L)              02/12/2021          )       Anesthesia Quick Evaluation

## 2021-02-12 NOTE — H&P (Signed)
NAME:  [name] [name], MRN:  [id number], DOB:  01-Mar-1932, LOS: 0 ADMISSION DATE:  02/12/2021, CONSULTATION DATE:  12/10 REFERRING MD:  Yvonne Kendall , CHIEF COMPLAINT:  AMS   History of Present Illness:  Patient is encephalopathic. Therefore history has been obtained from chart review.    [name] [name], is a 87 y.o. female, who presented to the [facility] ED with a chief complaint of AMS  They have a pertinent past medical history of Afib not on AC, GERD, HTN, PVD, HLD on ASA 81, OSA, dementia  Reportedly since 12/6 they have had steady mental decline at home, with the patient not eating or drinking.  ED course was notable for being lethargic and febrile on arrival. Tmax 104.1, WBC 12.4. CTA chest was obtained which was concerning for PE in right and left pulmonary arteries, lobar arteries, and multiple bilateral segmental and subsegmental pulmonary arteries. Concern for RV strain. UA was positive. She became increasingly hypotensive in the ED. She was given 2L IVF and started on a levophed gtt. Code sepsis was called Blood cultures were ordered. Started on cefepime and vancomycin in the ED.  PCCM was consulted for admission and management.   Pertinent  Medical History  Afib, GERD, HTN, PVD, HLD  Significant Hospital Events: Including procedures, antibiotic start and stop dates in addition to other pertinent events   12/10 Admit, large BL PE, +UA, Vanc cefepime, to IR for thrombectomy  Interim History / Subjective:  Levophed 10  Unable to obtain subjective evaluation due to patient status  Objective   Blood pressure 95/79, pulse (!) 120, temperature (!) 104.1 F (40.1 C), temperature source Rectal, resp. rate (!) 29, SpO2 96 %.        Intake/Output Summary (Last 24 hours) at 02/12/2021 1914 Last data filed at 02/12/2021 1806 Gross per 24 hour  Intake 1718.1 ml  Output --  Net 1718.1 ml   There were no vitals filed for this visit.  Examination: General: In bed, NAD, appears  comfortable HEENT: MM pink/moist, anicteric, atraumatic Neuro: RASS 0, PERRL 79mm, con CV: S1S2, Afib, no m/r/g appreciated PULM:  clear in the upper lobes, clear in the lower lobes, trachea midline, chest expansion symmetric GI: soft, bsx4 active, non-tender   Extremities: warm/dry, no pretibial edema, capillary refill less than 3 seconds  Skin: Stage 2 PI to sacrum  AG 13 INR 1.3 UA + for nitrite COVID and FLU neg BNP 366 CXR: no pneumo, effusion, or infiltrate 12 lead with no obvious ST changes  Resolved Hospital Problem list     Assessment & Plan:  Shock- ?septic on obstructive Pulmonary embolism Acute encephalopathy Lactic acidosis CTA Chest, abd, pelvis with PE in right and left pulmonary arteries, lobar arteries, and multiple bilateral segmental and subsegmental pulmonary arteries. Concern for RV strain . Tmax 104.1, WBC 12.4. RV strain on bedside echo. On RA. PESI 197, class V. On 14>10 levophed. S/P 2L IVF. Possible UTA based on UA. CXR without infiltrate HX of afib- possible clot source. Suspect AMS secondary to UTI/sepsis. Lactate 2. No focal deficits. Heparin GTT started. -IR consulted. Plan to take to IR for catheter directed pulmonary thrombectomy. -Goal MAP 65 or greater. Continue levophed at goal -Continue broad spectrum ABX- cefepime/vanc. Narrow as cultures result. -Continue heparin gtt. -Check echo -follow up lactate -Follow fever/wbc curve -Swallow eval -BLLE [place] -Follow up troponin  Afib, not on AC No RVR. -Continue heparin gtt. -holding on BB in the setting of hypotension  AKI Presumed UTI  Creat 1.30, 0.75 on 8/22. BUN 31. Air seen in bladder on CTA. Was straight cathed. UA + for nitrite. Suspect UTI  -Continue broad spectrum ABX- cefepime/vanc. Narrow as cultures result. -Ensure renal perfusion. Goal MAP 65 or greater. -Avoid neprotoxic drugs as possible. -Strict I&O's -Follow up AM creatinine  HX HTN HX HLD HX PVD -Holding on resumption of  ASA s/p IR -Holding home antihypertensives in setting of hypotension -resume statin  HX GERD -PPI  HX dementia -Resume namenda pending bedside swallow eval  Pressure injury- PTA -WC consult.  Malnutrition- unspecified Albumin 3.1 -RD consult  Best Practice (right click and "Reselect all SmartList Selections" daily)   Diet/type: NPO w/ oral meds DVT prophylaxis: systemic heparin GI prophylaxis: PPI Lines: N/A Foley:  N/A Code Status:  full code Last date of multidisciplinary goals of care discussion [12/10 spoke with daughter, full scope of care.]  Labs   CBC: Recent Labs  Lab 02/12/21 1556  WBC 12.4*  NEUTROABS 9.6*  HGB 14.6  HCT 44.7  MCV 101.4*  PLT 619    Basic Metabolic Panel: Recent Labs  Lab 02/12/21 1556  NA 138  K 3.4*  CL 105  CO2 20*  GLUCOSE 112*  BUN 31*  CREATININE 1.30*  CALCIUM 9.3   GFR: CrCl cannot be calculated (Unknown ideal weight.). Recent Labs  Lab 02/12/21 1556  WBC 12.4*  LATICACIDVEN 2.0*    Liver Function Tests: Recent Labs  Lab 02/12/21 1556  AST 29  ALT 14  ALKPHOS 58  BILITOT 1.7*  PROT 7.4  ALBUMIN 3.1*   Recent Labs  Lab 02/12/21 1556  LIPASE 22   No results for input(s): AMMONIA in the last 168 hours.  ABG    Component Value Date/Time   TCO2 27 10/15/2017 1845     Coagulation Profile: Recent Labs  Lab 02/12/21 1556  INR 1.3*    Cardiac Enzymes: No results for input(s): CKTOTAL, CKMB, CKMBINDEX, TROPONINI in the last 168 hours.  HbA1C: No results found for: HGBA1C  CBG: No results for input(s): GLUCAP in the last 168 hours.  Review of Systems:   Unable to obtain ROS due to patient status  Past Medical History:  She,  has a past medical history of Allergy, Arthritis, Asthma, Cataract, Diverticulosis, Dyslipidemia, GERD (gastroesophageal reflux disease), Hemorrhoids, History of atrial fibrillation, Hypertension, Menopause, Obesity, and PVD (peripheral vascular disease) ([employer]).    Surgical History:   Past Surgical History:  Procedure Laterality Date   ABDOMINAL AORTAGRAM N/A 02/09/2012   Procedure: ABDOMINAL AORTAGRAM;  Surgeon: Elam Dutch, MD;  Location: [facility] CATH LAB;  Service: Cardiovascular;  Laterality: N/A;   COLONOSCOPY  02/2004   EYE SURGERY     CATARACT (BILATERAL)   HERNIA REPAIR     abdominal x 2   right femoral to anterior tibial bypass  2004   SHOULDER SURGERY     age 28 due to dislocation   TEMPORAL ARTERY BIOPSY / LIGATION       Social History:   reports that she has never smoked. She has never used smokeless tobacco. She reports that she does not drink alcohol and does not use drugs.   Family History:  Her family history includes Cancer in her sister; Heart attack in her son; Heart disease in her son; Hyperlipidemia in her son; Mental retardation in her paternal aunt.   Allergies No Known Allergies   Home Medications  Prior to Admission medications   Medication Sig Start Date End Date Taking? Authorizing  Provider  acetaminophen (TYLENOL) 650 MG CR tablet Take 650-1,300 mg by mouth every 8 (eight) hours as needed (arthritis pain).    [provider]  aspirin EC 81 MG tablet Take 81 mg by mouth daily.    [provider]  atenolol-chlorthalidone (TENORETIC) 100-25 MG tablet Take 1 tablet by mouth daily. 10/12/20   Denita Lung, MD  memantine (NAMENDA XR) 14 MG CP24 24 hr capsule Take 1 capsule (14 mg total) by mouth daily. 07/13/20   Ward Givens, NP  Multiple Vitamin (MULITIVITAMIN WITH MINERALS) TABS Take 1 tablet by mouth daily.    [provider]  OVER THE COUNTER MEDICATION Take 1 capsule by mouth daily. Omega XL Patient not taking: Reported on 11/16/2020    [provider]  simvastatin (ZOCOR) 40 MG tablet Take 1 tablet (40 mg total) by mouth at bedtime. 10/12/20   Denita Lung, MD  sodium chloride (OCEAN) 0.65 % SOLN nasal spray Place 1 spray into both nostrils as needed for  congestion. Patient not taking: No sig reported    [provider]  spironolactone (ALDACTONE) 25 MG tablet Take 1 tablet (25 mg total) by mouth daily. 10/12/20   Denita Lung, MD  triamcinolone cream (KENALOG) 0.5 % APPLY TOPICALLY 3 TIMES  DAILY 06/10/19   Denita Lung, MD     Critical care time: 77 minutes     Redmond [employer]., MSN, APRN, AGACNP-BC [facility]  02/12/2021 , 10:33 PM  Please see [email or web address] for pager details  If no response, please call [phone] After hours, please call Elink at [phone]

## 2021-02-12 NOTE — ED Triage Notes (Signed)
Pt from home. Since Tuesday, patient having steady mental decline (hx of dementia but is less communicative than usual), has not been eating or drinking, new sores on her buttocks, febrile, and today is alert only to painful stimuli and is very lethargic. 500cc NS bolus given PTA.

## 2021-02-12 NOTE — Anesthesia Procedure Notes (Signed)
Arterial Line Insertion Start/End12/12/2020 10:20 PM, 02/12/2021 10:22 PM Performed by: Darral Dash, DO, anesthesiologist  Patient location: OOR procedure area. Preanesthetic checklist: patient identified, IV checked, site marked, risks and benefits discussed, surgical consent, monitors and equipment checked, pre-op evaluation, timeout performed and anesthesia consent Lidocaine 1% used for infiltration Left, radial was placed Catheter size: 20 G Hand hygiene performed  and maximum sterile barriers used   Attempts: 1 Procedure performed using ultrasound guided technique. Ultrasound Notes:anatomy identified, needle tip was noted to be adjacent to the nerve/plexus identified and no ultrasound evidence of intravascular and/or intraneural injection Following insertion, dressing applied and Biopatch. Post procedure assessment: normal and unchanged  Post procedure complications: second provider assisted.

## 2021-02-12 NOTE — Progress Notes (Signed)
Pharmacy Antibiotic Note  [name] [name] is a 87 y.o. female admitted on 02/12/2021 with  sepsis 2/2 unknown source .  Pharmacy has been consulted for cefepime and vancomycin dosing.  WBC 12.4, SCr 1.3   Plan: -Cefepime 2 gm IV Q 24 hours -Vancomycin 1 gm now followed by Vancomycin 1250 mg IV Q 48 hrs. Goal AUC 400-550. Expected AUC: 532 SCr used: 1.3 -Monitor CBC, renal fx, cultures and clinical course -Vanc levels as indicated      Temp (24hrs), Avg:102.3 F (39.1 C), Min:100.5 F (38.1 C), Max:104.1 F (40.1 C)  No results for input(s): WBC, CREATININE, LATICACIDVEN, VANCOTROUGH, VANCOPEAK, VANCORANDOM, GENTTROUGH, GENTPEAK, GENTRANDOM, TOBRATROUGH, TOBRAPEAK, TOBRARND, AMIKACINPEAK, AMIKACINTROU, AMIKACIN in the last 168 hours.  CrCl cannot be calculated (Patient's most recent lab result is older than the maximum 21 days allowed.).    No Known Allergies  Antimicrobials this admission: Cefepime 12/10 >>  Vancomycin 12/10 >>   Dose adjustments this admission:   Microbiology results: 12/10 BCx:  12/10 UCx:     Thank you for allowing pharmacy to be a part of this patient's care.  Albertina Parr, PharmD., BCPS, BCCCP Clinical Pharmacist Please refer to [facility] for unit-specific pharmacist

## 2021-02-13 ENCOUNTER — Other Ambulatory Visit (HOSPITAL_COMMUNITY): Payer: Medicare HMO

## 2021-02-13 ENCOUNTER — Inpatient Hospital Stay (HOSPITAL_COMMUNITY): Payer: Medicare HMO

## 2021-02-13 DIAGNOSIS — I2694 Multiple subsegmental pulmonary emboli without acute cor pulmonale: Secondary | ICD-10-CM | POA: Diagnosis not present

## 2021-02-13 DIAGNOSIS — I2699 Other pulmonary embolism without acute cor pulmonale: Secondary | ICD-10-CM

## 2021-02-13 DIAGNOSIS — L8990 Pressure ulcer of unspecified site, unspecified stage: Secondary | ICD-10-CM | POA: Insufficient documentation

## 2021-02-13 HISTORY — PX: IR US GUIDE VASC ACCESS RIGHT: IMG2390

## 2021-02-13 HISTORY — PX: IR THROMBECT PRIM MECH INIT (INCLU) MOD SED: IMG2297

## 2021-02-13 LAB — BASIC METABOLIC PANEL WITH GFR
Anion gap: 9 (ref 5–15)
BUN: 26 mg/dL — ABNORMAL HIGH (ref 8–23)
CO2: 21 mmol/L — ABNORMAL LOW (ref 22–32)
Calcium: 8.1 mg/dL — ABNORMAL LOW (ref 8.9–10.3)
Chloride: 105 mmol/L (ref 98–111)
Creatinine, Ser: 0.95 mg/dL (ref 0.44–1.00)
GFR, Estimated: 58 mL/min — ABNORMAL LOW
Glucose, Bld: 173 mg/dL — ABNORMAL HIGH (ref 70–99)
Potassium: 3.2 mmol/L — ABNORMAL LOW (ref 3.5–5.1)
Sodium: 135 mmol/L (ref 135–145)

## 2021-02-13 LAB — CBC
HCT: 33.2 % — ABNORMAL LOW (ref 36.0–46.0)
Hemoglobin: 11 g/dL — ABNORMAL LOW (ref 12.0–15.0)
MCH: 32.6 pg (ref 26.0–34.0)
MCHC: 33.1 g/dL (ref 30.0–36.0)
MCV: 98.5 fL (ref 80.0–100.0)
Platelets: 158 10*3/uL (ref 150–400)
RBC: 3.37 MIL/uL — ABNORMAL LOW (ref 3.87–5.11)
RDW: 12.9 % (ref 11.5–15.5)
WBC: 12.8 10*3/uL — ABNORMAL HIGH (ref 4.0–10.5)
nRBC: 0 % (ref 0.0–0.2)

## 2021-02-13 LAB — PHOSPHORUS: Phosphorus: 2.9 mg/dL (ref 2.5–4.6)

## 2021-02-13 LAB — LACTIC ACID, PLASMA
Lactic Acid, Venous: 1.4 mmol/L (ref 0.5–1.9)
Lactic Acid, Venous: 1.3 mmol/L (ref 0.5–1.9)

## 2021-02-13 LAB — MRSA NEXT GEN BY PCR, NASAL: MRSA by PCR Next Gen: NOT DETECTED

## 2021-02-13 LAB — HEPARIN LEVEL (UNFRACTIONATED)
Heparin Unfractionated: 1.06 IU/mL — ABNORMAL HIGH (ref 0.30–0.70)
Heparin Unfractionated: 0.49 IU/mL (ref 0.30–0.70)

## 2021-02-13 LAB — PROCALCITONIN: Procalcitonin: 0.14 ng/mL

## 2021-02-13 LAB — GLUCOSE, CAPILLARY: Glucose-Capillary: 161 mg/dL — ABNORMAL HIGH (ref 70–99)

## 2021-02-13 LAB — MAGNESIUM: Magnesium: 1.7 mg/dL (ref 1.7–2.4)

## 2021-02-13 MED ORDER — POTASSIUM CHLORIDE 20 MEQ PO PACK
40.0000 meq | PACK | ORAL | Status: AC
  Administered 2021-02-13 (×2): 40 meq via ORAL
  Filled 2021-02-13 (×2): qty 2

## 2021-02-13 MED ORDER — POTASSIUM CHLORIDE 10 MEQ/100ML IV SOLN
10.0000 meq | INTRAVENOUS | Status: AC
  Administered 2021-02-13 (×6): 10 meq via INTRAVENOUS
  Filled 2021-02-13 (×6): qty 100

## 2021-02-13 MED ORDER — CHLORHEXIDINE GLUCONATE CLOTH 2 % EX PADS
6.0000 | MEDICATED_PAD | Freq: Every day | CUTANEOUS | Status: DC
  Administered 2021-02-13 – 2021-02-17 (×4): 6 via TOPICAL

## 2021-02-13 MED ORDER — IOHEXOL 300 MG/ML  SOLN
100.0000 mL | Freq: Once | INTRAMUSCULAR | Status: AC | PRN
  Administered 2021-02-13: 75 mL via INTRA_ARTERIAL

## 2021-02-13 MED ORDER — MAGNESIUM SULFATE 2 GM/50ML IV SOLN
2.0000 g | Freq: Once | INTRAVENOUS | Status: AC
  Administered 2021-02-13: 2 g via INTRAVENOUS
  Filled 2021-02-13: qty 50

## 2021-02-13 MED ORDER — ORAL CARE MOUTH RINSE
15.0000 mL | Freq: Two times a day (BID) | OROMUCOSAL | Status: DC
  Administered 2021-02-13 – 2021-02-23 (×21): 15 mL via OROMUCOSAL

## 2021-02-13 MED ORDER — CHLORHEXIDINE GLUCONATE CLOTH 2 % EX PADS: 6.0000 | MEDICATED_PAD | Freq: Every day | CUTANEOUS | Status: DC

## 2021-02-13 MED ORDER — LACTATED RINGERS IV BOLUS
500.0000 mL | Freq: Once | INTRAVENOUS | Status: AC
  Administered 2021-02-13: 500 mL via INTRAVENOUS

## 2021-02-13 NOTE — Progress Notes (Signed)
VASCULAR LAB    Lower extremity venous duplex has been performed.  See CV proc for preliminary results.   Narjis Mira, RVT 02/13/2021, 11:41 AM

## 2021-02-13 NOTE — Progress Notes (Signed)
[facility] ADULT ICU REPLACEMENT PROTOCOL   The patient does apply for the [facility] Adult ICU Electrolyte Replacment Protocol based on the criteria listed below:   1.Exclusion criteria: TCTS patients, ECMO patients, and Dialysis patients 2. Is GFR >/= 30 ml/min? Yes.    Patient's GFR today is 58 3. Is SCr </= 2? Yes.   Patient's SCr is 0.95 mg/dL 4. Did SCr increase >/= 0.5 in 24 hours? No. 5.Pt's weight >40kg  Yes.   6. Abnormal electrolyte(s): K+3.2  7. Electrolytes replaced per protocol 8.  Call MD STAT for K+ </= 2.5, Phos </= 1, or Mag </= 1 Physician:  Reather Littler 02/13/2021 4:51 AM

## 2021-02-13 NOTE — Progress Notes (Signed)
eLink Physician-Brief Progress Note Patient Name: [name] [name] DOB: May 26, 1931 MRN: [id number]   Date of Service  02/13/2021  HPI/Events of Note  53F with alzheimer's, afib not on anticoagulation, OSA, PVD, HTN, HLD admitted for massive PE s/p PA thrombectomy. Transferred to ICU post-procedure. On levophed 12 mcg/min  Labs with AKI, troponemia and lactic acidosis. WBC 12.4  eICU Interventions  Heparin gtt, echo, antibiotics Wean levophed for SBP goal 130-140 per IR IVF bolus 500cc now      Intervention Category Evaluation Type: New Patient Evaluation  Wasim Hurlbut Rodman Pickle 02/13/2021, 1:23 AM

## 2021-02-13 NOTE — Progress Notes (Signed)
ANTICOAGULATION CONSULT NOTE - Follow Up Consult  Pharmacy Consult for heparin Indication: pulmonary embolus (now s/p thrombectomy)  Labs: Recent Labs    02/12/21 1556 02/12/21 2124 02/13/21 0329  HGB 14.6  --  11.0*  HCT 44.7  --  33.2*  PLT 180  --  158  APTT 30  --   --   LABPROT 16.6*  --   --   INR 1.3*  --   --   HEPARINUNFRC  --   --  1.06*  CREATININE 1.30*  --   --   TROPONINIHS  --  853*  --     Assessment: 87yo female supratherapeutic on heparin with initial dosing for PE, now s/p thrombectomy; no infusion issues per RN though he notes some minor bleeding around IV site.  Goal of Therapy:  Heparin level 0.3-0.7 units/ml   Plan:  Will decrease heparin infusion by 3-4 units/kg/hr to 800 units/hr and check level in 8 hours.    Wynona Neat, PharmD, BCPS  02/13/2021,4:28 AM

## 2021-02-13 NOTE — Progress Notes (Signed)
NAME:  [name] [name], MRN:  [id number], DOB:  Aug 17, 1931, LOS: 1 ADMISSION DATE:  02/12/2021, CONSULTATION DATE:  12/10 REFERRING MD:  Yvonne Kendall , CHIEF COMPLAINT:  AMS   History of Present Illness:  Patient is encephalopathic. Therefore history has been obtained from chart review.    [name] [name], is a 87 y.o. female, who presented to the [facility] ED with a chief complaint of AMS  They have a pertinent past medical history of Afib not on AC, GERD, HTN, PVD, HLD on ASA 81, OSA, dementia  Reportedly since 12/6 they have had steady mental decline at home, with the patient not eating or drinking.  ED course was notable for being lethargic and febrile on arrival. Tmax 104.1, WBC 12.4. CTA chest was obtained which was concerning for PE in right and left pulmonary arteries, lobar arteries, and multiple bilateral segmental and subsegmental pulmonary arteries. Concern for RV strain. UA was positive. She became increasingly hypotensive in the ED. She was given 2L IVF and started on a levophed gtt. Code sepsis was called Blood cultures were ordered. Started on cefepime and vancomycin in the ED.  PCCM was consulted for admission and management.   Pertinent  Medical History  Afib, GERD, HTN, PVD, HLD  Significant Hospital Events: Including procedures, antibiotic start and stop dates in addition to other pertinent events   12/10 Admit, large BL PE, +UA, Vanc cefepime, to IR for thrombectomy Underwent thrombectomy under MAC 12/11 TTE 12/11 >>   Interim History / Subjective:   On RA Norepi remains 10; BP has been labile  Precedex off Heparin gtt TTE pending Hypokalemia Cx's sent and pending   Objective   Blood pressure 128/84, pulse 88, temperature (!) 96.9 F (36.1 C), temperature source Axillary, resp. rate 20, weight 53.8 kg, SpO2 100 %.        Intake/Output Summary (Last 24 hours) at 02/13/2021 0757 Last data filed at 02/13/2021 0600 Gross per 24 hour  Intake 6643.36 ml  Output  850 ml  Net 5793.36 ml   Filed Weights   02/13/21 0407  Weight: 53.8 kg    Examination: General: Ill-appearing elderly woman, laying comfortably in bed HEENT: Oropharynx clear, pupils equal Neuro: Somewhat confused, does orient, answer questions.  Moving all extremities CV: Irregularly irregular, distant PULM: Clear bilaterally GI: Nondistended, positive bowel sounds Extremities: No edema, arterial line left radial Skin: Stage 2 PI to sacrum per RN    [facility] Problem list     Assessment & Plan:  Shock- ?septic on obstructive Pulmonary embolism Acute encephalopathy Lactic acidosis CTA Chest, abd, pelvis with PE in right and left pulmonary arteries, lobar arteries, and multiple bilateral segmental and subsegmental pulmonary arteries. Concern for RV strain . Tmax 104.1, WBC 12.4. RV strain on bedside echo. On RA. PESI 197, class V. On 14>10 levophed. S/P 2L IVF. Possible UTI based on UA. CXR without infiltrate HX of afib- possible clot source. Suspect AMS secondary to UTI/sepsis. Lactate 2. No focal deficits. Heparin GTT started. -Underwent thrombectomy overnight, follow-up as per IR protocol -Wean norepinephrine as able, goal MAP 65 -On empiric broad-spectrum antibiotics, unclear whether there is an active infectious process here.  Will narrow quickly if cultures unrevealing -Check procalcitonin to help determine duration antibiotics -Continue heparin -TTE pending -Bilateral lower extremity Doppler ultrasound pending -trend lactate  Afib, not on AC No RVR. -On heparin infusion -Beta-blockade currently on hold given hypotension  AKI, improved Presumed UTI Creat 1.30, 0.75 on 8/22. BUN 31. Air seen  in bladder on CTA. Was straight cathed. UA + for nitrite. Suspect UTI  -Antibiotics and cultures as above -Continue to ensure adequate renal perfusion, volume -Follow urine output, BMP  HX HTN HX HLD HX PVD -Aspirin on hold, restart when okay with interventional  radiology -Antihypertensives on hold -Continue statin  HX GERD -PPI as ordered  HX dementia -Resume namenda pending bedside swallow eval  Pressure injury- PTA -WOC  Malnutrition- unspecified Albumin 3.1 -Nutrition consult  Best Practice (right click and "Reselect all SmartList Selections" daily)   Diet/type: NPO w/ oral meds DVT prophylaxis: systemic heparin GI prophylaxis: PPI Lines: N/A Foley:  N/A Code Status:  full code Last date of multidisciplinary goals of care discussion [12/10 spoke with daughter, full scope of care.]  Labs   CBC: Recent Labs  Lab 02/12/21 1556 02/13/21 0329  WBC 12.4* 12.8*  NEUTROABS 9.6*  --   HGB 14.6 11.0*  HCT 44.7 33.2*  MCV 101.4* 98.5  PLT 180 829    Basic Metabolic Panel: Recent Labs  Lab 02/12/21 1556 02/13/21 0329  NA 138 135  K 3.4* 3.2*  CL 105 105  CO2 20* 21*  GLUCOSE 112* 173*  BUN 31* 26*  CREATININE 1.30* 0.95  CALCIUM 9.3 8.1*  MG  --  1.7  PHOS  --  2.9   GFR: Estimated Creatinine Clearance: 31.2 mL/min (by C-G formula based on SCr of 0.95 mg/dL). Recent Labs  Lab 02/12/21 1556 02/12/21 2124 02/13/21 0329  WBC 12.4*  --  12.8*  LATICACIDVEN 2.0* 4.4*  --     Liver Function Tests: Recent Labs  Lab 02/12/21 1556  AST 29  ALT 14  ALKPHOS 58  BILITOT 1.7*  PROT 7.4  ALBUMIN 3.1*   Recent Labs  Lab 02/12/21 1556  LIPASE 22   No results for input(s): AMMONIA in the last 168 hours.  ABG    Component Value Date/Time   TCO2 27 10/15/2017 1845     Coagulation Profile: Recent Labs  Lab 02/12/21 1556  INR 1.3*    Cardiac Enzymes: No results for input(s): CKTOTAL, CKMB, CKMBINDEX, TROPONINI in the last 168 hours.  HbA1C: No results found for: HGBA1C  CBG: Recent Labs  Lab 02/13/21 0202  GLUCAP 161*     Critical care time:  minutes     Baltazar Apo, MD, PhD 02/13/2021, 7:58 AM [facility] [phone] or if no answer before 7:00PM call  [phone] For any issues after 7:00PM please call eLink [phone]

## 2021-02-13 NOTE — Procedures (Signed)
Thrombectomy Pre/Post Pressures  Pre PAP pressure: 26/14 Mean (18) Post PAP pressure: 24/4 Mean (14)

## 2021-02-13 NOTE — Transfer of Care (Signed)
Immediate Anesthesia Transfer of Care Note  Patient: [name] [name]  Procedure(s) Performed: IR WITH ANESTHESIA  Patient Location: ICU  Anesthesia Type:MAC  Level of Consciousness: sedated  Airway & Oxygen Therapy: Patient connected to face mask oxygen  Post-op Assessment: Report given to RN and Post -op Vital signs reviewed and stable  Post vital signs: Reviewed and stable  Last Vitals:  Vitals Value Taken Time  BP    Temp    Pulse    Resp    SpO2      Last Pain:  Vitals:   02/12/21 1527  TempSrc: Rectal  PainSc:          Complications: No notable events documented.

## 2021-02-13 NOTE — Progress Notes (Signed)
ANTICOAGULATION CONSULT NOTE - Initial Consult  Pharmacy Consult for heparin Indication: pulmonary embolus  No Known Allergies  Patient Measurements: Weight: 53.8 kg (118 lb 9.7 oz) Heparin Dosing Weight: 60 kg   Vital Signs: Temp: 98.1 F (36.7 C) (12/11 1200) Temp Source: Oral (12/11 1200) BP: 94/63 (12/11 1315) Pulse Rate: 83 (12/11 1315)  Labs: Recent Labs    02/12/21 1556 02/12/21 2124 02/13/21 0329 02/13/21 1250  HGB 14.6  --  11.0*  --   HCT 44.7  --  33.2*  --   PLT 180  --  158  --   APTT 30  --   --   --   LABPROT 16.6*  --   --   --   INR 1.3*  --   --   --   HEPARINUNFRC  --   --  1.06* 0.49  CREATININE 1.30*  --  0.95  --   TROPONINIHS  --  853*  --   --      Estimated Creatinine Clearance: 31.2 mL/min (by C-G formula based on SCr of 0.95 mg/dL).   Medical History: Past Medical History:  Diagnosis Date   Allergy    Arthritis    Asthma    Cataract    Diverticulosis    Dyslipidemia    GERD (gastroesophageal reflux disease)    Hemorrhoids    History of atrial fibrillation    Hypertension    Menopause    Obesity    PVD (peripheral vascular disease) (HCC)     Medications:  Medications Prior to Admission  Medication Sig Dispense Refill Last Dose   acetaminophen (TYLENOL) 650 MG CR tablet Take 650-1,300 mg by mouth every 8 (eight) hours as needed (arthritis pain).   Past Month   aspirin EC 81 MG tablet Take 81 mg by mouth daily.   02/11/2021   atenolol-chlorthalidone (TENORETIC) 100-25 MG tablet Take 1 tablet by mouth daily. 90 tablet 3 02/11/2021   ELDERBERRY PO Take 1 tablet by mouth daily.   02/11/2021   memantine (NAMENDA XR) 14 MG CP24 24 hr capsule Take 1 capsule (14 mg total) by mouth daily. 30 capsule 11 02/11/2021   Multiple Vitamin (MULITIVITAMIN WITH MINERALS) TABS Take 1 tablet by mouth daily.   02/11/2021   Pyridoxine HCl (VITAMIN B-6 PO) Take 1 tablet by mouth daily.   02/11/2021   simvastatin (ZOCOR) 40 MG tablet Take 1 tablet (40 mg  total) by mouth at bedtime. 90 tablet 3 02/11/2021   spironolactone (ALDACTONE) 25 MG tablet Take 1 tablet (25 mg total) by mouth daily. 90 tablet 3 02/11/2021   triamcinolone cream (KENALOG) 0.5 % APPLY TOPICALLY 3 TIMES  DAILY (Patient taking differently: Apply 1 application topically 3 (three) times daily as needed (rash). APPLY TOPICALLY 3 TIMES  DAILY) 240 g 3 unk    Assessment: 36 YOF with h/o PVD here with declining mental status. A CT chest showed bilateral PE with R heart strain.   Heparin level therapeutic at 0.49. No bleeding noted    Goal of Therapy:  Heparin level 0.3-0.7 units/ml Monitor platelets by anticoagulation protocol: Yes   Plan:  -Heparin infusion at 800 units/hr -Monitor daily HL, CBC and s/s of bleeding   Alanda Slim, PharmD, [facility] Clinical Pharmacist Please see AMION for all Pharmacists' Contact Phone Numbers 02/13/2021, 1:29 PM

## 2021-02-13 NOTE — Procedures (Signed)
Vascular and Interventional Radiology Procedure Note  Patient: [name] [name] DOB: 1931-11-20 Medical Record Number: [id number] Note Date/Time: 02/13/21 12:22 AM   Performing Physician: Michaelle Birks, MD Assistant(s): Sharen Heck Mir, MD  Diagnosis: Bilateral PEs  Procedure:  PULMONARY ARTERIOGRAPHY CATHETER-DIRECTED PULMONARY THROMBECTOMY  Anesthesia: General Anesthesia Complications: None Estimated Blood Loss:  350 mL Specimens: Pathology  Findings:  - access via the right femoral vein. - bilateral PE with PAH. - PAP, mean Pre vs Post : 18 vs 14 - Clot removed and submitted as specimen.  Final report to follow once all images are reviewed and compared with previous studies.  See detailed dictation with images in PACS. The patient tolerated the procedure well without incident or complication and was returned to ICU in stable condition.    Michaelle Birks, MD [facility] Interventional Radiology [facility] Radiology   Pager. [place]

## 2021-02-14 ENCOUNTER — Encounter (HOSPITAL_COMMUNITY): Payer: Self-pay | Admitting: Interventional Radiology

## 2021-02-14 ENCOUNTER — Inpatient Hospital Stay (HOSPITAL_COMMUNITY): Payer: Medicare HMO

## 2021-02-14 DIAGNOSIS — I2609 Other pulmonary embolism with acute cor pulmonale: Secondary | ICD-10-CM

## 2021-02-14 DIAGNOSIS — I2694 Multiple subsegmental pulmonary emboli without acute cor pulmonale: Secondary | ICD-10-CM | POA: Diagnosis not present

## 2021-02-14 DIAGNOSIS — E43 Unspecified severe protein-calorie malnutrition: Secondary | ICD-10-CM | POA: Insufficient documentation

## 2021-02-14 LAB — CBC
HCT: 28 % — ABNORMAL LOW (ref 36.0–46.0)
Hemoglobin: 9.5 g/dL — ABNORMAL LOW (ref 12.0–15.0)
MCH: 32.6 pg (ref 26.0–34.0)
MCHC: 33.9 g/dL (ref 30.0–36.0)
MCV: 96.2 fL (ref 80.0–100.0)
Platelets: 143 10*3/uL — ABNORMAL LOW (ref 150–400)
RBC: 2.91 MIL/uL — ABNORMAL LOW (ref 3.87–5.11)
RDW: 12.8 % (ref 11.5–15.5)
WBC: 12.5 10*3/uL — ABNORMAL HIGH (ref 4.0–10.5)
nRBC: 0.2 % (ref 0.0–0.2)

## 2021-02-14 LAB — HEPARIN LEVEL (UNFRACTIONATED)
Heparin Unfractionated: 0.18 IU/mL — ABNORMAL LOW (ref 0.30–0.70)
Heparin Unfractionated: 0.17 IU/mL — ABNORMAL LOW (ref 0.30–0.70)
Heparin Unfractionated: 0.32 IU/mL (ref 0.30–0.70)

## 2021-02-14 LAB — BASIC METABOLIC PANEL
Anion gap: 9 (ref 5–15)
BUN: 18 mg/dL (ref 8–23)
CO2: 21 mmol/L — ABNORMAL LOW (ref 22–32)
Calcium: 8.5 mg/dL — ABNORMAL LOW (ref 8.9–10.3)
Chloride: 106 mmol/L (ref 98–111)
Creatinine, Ser: 0.71 mg/dL (ref 0.44–1.00)
GFR, Estimated: >60 mL/min (ref >60)
Glucose, Bld: 102 mg/dL — ABNORMAL HIGH (ref 70–99)
Potassium: 3.3 mmol/L — ABNORMAL LOW (ref 3.5–5.1)
Sodium: 136 mmol/L (ref 135–145)

## 2021-02-14 LAB — ECHOCARDIOGRAM COMPLETE
Area-P 1/2: 3.93 cm2
Calc EF: 53.1 %
P 1/2 time: 522 msec
S' Lateral: 2.1 cm
Single Plane A2C EF: 48.9 %
Single Plane A4C EF: 60.5 %
Weight: 1897.72 oz

## 2021-02-14 LAB — PHOSPHORUS: Phosphorus: 1.5 mg/dL — ABNORMAL LOW (ref 2.5–4.6)

## 2021-02-14 LAB — TROPONIN I (HIGH SENSITIVITY): Troponin I (High Sensitivity): 437 ng/L (ref <18)

## 2021-02-14 LAB — MAGNESIUM: Magnesium: 1.8 mg/dL (ref 1.7–2.4)

## 2021-02-14 LAB — PROCALCITONIN: Procalcitonin: 0.11 ng/mL

## 2021-02-14 LAB — POCT ACTIVATED CLOTTING TIME
Activated Clotting Time: 221 seconds
Activated Clotting Time: 239 seconds

## 2021-02-14 MED ORDER — MEMANTINE HCL ER 14 MG PO CP24
14.0000 mg | ORAL_CAPSULE | Freq: Every day | ORAL | Status: DC
  Administered 2021-02-14 – 2021-02-23 (×10): 14 mg via ORAL
  Filled 2021-02-14 (×11): qty 1

## 2021-02-14 MED ORDER — SODIUM CHLORIDE 0.9 % IV SOLN
1.0000 g | INTRAVENOUS | Status: AC
  Administered 2021-02-14 – 2021-02-16 (×3): 1 g via INTRAVENOUS
  Filled 2021-02-14 (×3): qty 10

## 2021-02-14 MED ORDER — ADULT MULTIVITAMIN W/MINERALS CH
1.0000 | ORAL_TABLET | Freq: Every day | ORAL | Status: DC
  Administered 2021-02-14 – 2021-02-23 (×10): 1 via ORAL
  Filled 2021-02-14 (×10): qty 1

## 2021-02-14 MED ORDER — MAGNESIUM SULFATE 2 GM/50ML IV SOLN
2.0000 g | Freq: Once | INTRAVENOUS | Status: AC
  Administered 2021-02-14: 2 g via INTRAVENOUS
  Filled 2021-02-14: qty 50

## 2021-02-14 MED ORDER — ENSURE ENLIVE PO LIQD
237.0000 mL | Freq: Two times a day (BID) | ORAL | Status: DC
  Administered 2021-02-14 – 2021-02-20 (×14): 237 mL via ORAL

## 2021-02-14 MED ORDER — POTASSIUM PHOSPHATES 15 MMOLE/5ML IV SOLN
30.0000 mmol | Freq: Once | INTRAVENOUS | Status: AC
  Administered 2021-02-14: 30 mmol via INTRAVENOUS
  Filled 2021-02-14: qty 10

## 2021-02-14 MED ORDER — LACTATED RINGERS IV BOLUS
500.0000 mL | Freq: Once | INTRAVENOUS | Status: AC
  Administered 2021-02-14: 500 mL via INTRAVENOUS

## 2021-02-14 MED ORDER — PANTOPRAZOLE SODIUM 40 MG PO TBEC
40.0000 mg | DELAYED_RELEASE_TABLET | Freq: Every day | ORAL | Status: DC
  Administered 2021-02-14 – 2021-02-23 (×10): 40 mg via ORAL
  Filled 2021-02-14 (×10): qty 1

## 2021-02-14 NOTE — Progress Notes (Addendum)
NAME:  [name] [name], MRN:  [id number], DOB:  11/23/1931, LOS: 2 ADMISSION DATE:  02/12/2021, CONSULTATION DATE:  12/10 REFERRING MD:  Yvonne Kendall , CHIEF COMPLAINT:  AMS   History of Present Illness:  Patient is encephalopathic. Therefore history has been obtained from chart review.    [name] [name], is a 87 y.o. female, who presented to the [facility] ED with a chief complaint of AMS  They have a pertinent past medical history of Afib not on AC, GERD, HTN, PVD, HLD on ASA 81, OSA, dementia  Reportedly since 12/6 they have had steady mental decline at home, with the patient not eating or drinking.  ED course was notable for being lethargic and febrile on arrival. Tmax 104.1, WBC 12.4. CTA chest was obtained which was concerning for PE in right and left pulmonary arteries, lobar arteries, and multiple bilateral segmental and subsegmental pulmonary arteries. Concern for RV strain. UA was positive. She became increasingly hypotensive in the ED. She was given 2L IVF and started on a levophed gtt. Code sepsis was called Blood cultures were ordered. Started on cefepime and vancomycin in the ED.  PCCM was consulted for admission and management.   Pertinent  Medical History  Afib, GERD, HTN, PVD, HLD  Significant Hospital Events: Including procedures, antibiotic start and stop dates in addition to other pertinent events   12/10 Admit, large BL PE, +UA, Vanc cefepime, to IR for thrombectomy Underwent thrombectomy under MAC 12/11 U/S BLE 12/11 > +left femoral, popliteal, posterior tibial vein DVT  TTE 12/11 >>   Interim History / Subjective:  This AM on 2 mcg/min levophed, no events overnight   Objective   Blood pressure 97/61, pulse 84, temperature 98 F (36.7 C), temperature source Oral, resp. rate (!) 25, weight 53.8 kg, SpO2 99 %.        Intake/Output Summary (Last 24 hours) at 02/14/2021 7588 Last data filed at 02/14/2021 0600 Gross per 24 hour  Intake 2352.65 ml  Output 525 ml  Net  1827.65 ml   Filed Weights   02/13/21 0407  Weight: 53.8 kg    Examination: General: elderly female lying in bed, no distress  HEENT: Dry MM  Neuro: alert, oriented, to self, disoriented to location, time, situation, pleasant  CV: HR 82, irregular, no MRG PULM: Clear bilaterally, no use of accessory muscles  GI: Nondistended, non-tender, active bowel sounds  Extremities: No edema, arterial line left radial Skin: Stage 2 PI to sacrum per [facility] Problem list     Assessment & Plan:   Acute encephalopathy presumed in setting of shock, UTI >> Improving  HX dementia -Resume home namenda  Shock- multifactorial with obstructive in setting of PE and Sepsis with UTI   -CTA Chest, abd, pelvis with PE in right and left pulmonary arteries, lobar arteries, and multiple bilateral segmental and subsegmental pulmonary arteries -S/P Thrombectomy 12/11  -+DVT to Left femoral, popliteal, posterior tibial vein DVT  Plan -Titrate Levophed for MAP goal >65 (Currently on 2 mcg/min)  -Continuous Cardiac Monitoring  -TEE pending  -Continue Heparin  -Follow Culture Data  -Continue Cefepime/Vancomycin for now >> Urine Culture not obtained at admission, U/A +Nitrates with many bacteria >> day 2 of BC, if remain negative can de-escalate   HX HTN HX HLD HX PVD Plan -Aspirin on hold, restart when okay with interventional radiology -Antihypertensives on hold -Continue statin  Afib, not on AC No RVR. Plan -On heparin infusion -Beta-blockade currently on hold given hypotension  AKI, improved Lactic acidosis in setting of shock/hypoperfusion > Resolved Concern for UTI  Creat 1.30, 0.75 on 8/22. BUN 31. Air seen in bladder on CTA. Was straight cathed. UA + for nitrite. Suspect UTI  Plan -Follow urine output, BMP -Replace electrolytes as indicated  -Antibiotics as above   HX GERD Plan -PPI   Pressure injury- PTA Plan -WOC  Malnutrition- unspecified Albumin  3.1 Plan -Nutrition consult  Best Practice (right click and "Reselect all SmartList Selections" daily)   Diet/type: Heart Diet  DVT prophylaxis: systemic heparin GI prophylaxis: PPI Lines: Left Radial Aline.  Foley:  Yes, and it is still needed Code Status:  full code Last date of multidisciplinary goals of care discussion [12/10 spoke with daughter, full scope of care.]  Labs   CBC: Recent Labs  Lab 02/12/21 1556 02/13/21 0329 02/14/21 0500  WBC 12.4* 12.8* 12.5*  NEUTROABS 9.6*  --   --   HGB 14.6 11.0* 9.5*  HCT 44.7 33.2* 28.0*  MCV 101.4* 98.5 96.2  PLT 180 158 143*    Basic Metabolic Panel: Recent Labs  Lab 02/12/21 1556 02/13/21 0329  NA 138 135  K 3.4* 3.2*  CL 105 105  CO2 20* 21*  GLUCOSE 112* 173*  BUN 31* 26*  CREATININE 1.30* 0.95  CALCIUM 9.3 8.1*  MG  --  1.7  PHOS  --  2.9   GFR: Estimated Creatinine Clearance: 31.2 mL/min (by C-G formula based on SCr of 0.95 mg/dL). Recent Labs  Lab 02/12/21 1556 02/12/21 2124 02/13/21 0329 02/13/21 1335 02/13/21 1723 02/14/21 0500  PROCALCITON  --   --   --  0.14  --  0.11  WBC 12.4*  --  12.8*  --   --  12.5*  LATICACIDVEN 2.0* 4.4*  --  1.4 1.3  --     Liver Function Tests: Recent Labs  Lab 02/12/21 1556  AST 29  ALT 14  ALKPHOS 58  BILITOT 1.7*  PROT 7.4  ALBUMIN 3.1*   Recent Labs  Lab 02/12/21 1556  LIPASE 22   No results for input(s): AMMONIA in the last 168 hours.  ABG    Component Value Date/Time   TCO2 27 10/15/2017 1845     Coagulation Profile: Recent Labs  Lab 02/12/21 1556  INR 1.3*    Cardiac Enzymes: No results for input(s): CKTOTAL, CKMB, CKMBINDEX, TROPONINI in the last 168 hours.  HbA1C: No results found for: HGBA1C  CBG: Recent Labs  Lab 02/13/21 0202  GLUCAP 161*     Critical care time:  32 minutes    Hayden Pedro, AGACNP-BC [facility]  Pgr: [phone]  PCCM Pgr: [phone]

## 2021-02-14 NOTE — Progress Notes (Signed)
ANTICOAGULATION CONSULT NOTE  Pharmacy Consult for heparin Indication: pulmonary embolus  No Known Allergies  Patient Measurements: Weight: 53.8 kg (118 lb 9.7 oz) Heparin Dosing Weight: 60 kg   Vital Signs: Temp: 97.5 F (36.4 C) (12/12 1459) Temp Source: Oral (12/12 1459) BP: 93/49 (12/12 1600) Pulse Rate: 75 (12/12 1600)  Labs: Recent Labs    02/12/21 1556 02/12/21 2124 02/13/21 0329 02/13/21 1250 02/14/21 0500 02/14/21 0552 02/14/21 0922 02/14/21 1520  HGB 14.6  --  11.0*  --  9.5*  --   --   --   HCT 44.7  --  33.2*  --  28.0*  --   --   --   PLT 180  --  158  --  143*  --   --   --   APTT 30  --   --   --   --   --   --   --   LABPROT 16.6*  --   --   --   --   --   --   --   INR 1.3*  --   --   --   --   --   --   --   HEPARINUNFRC  --   --  1.06*   < > 0.18* 0.17*  --  0.32  CREATININE 1.30*  --  0.95  --   --   --  0.71  --   TROPONINIHS  --  853*  --   --   --   --  437*  --    < > = values in this interval not displayed.     Estimated Creatinine Clearance: 37.1 mL/min (by C-G formula based on SCr of 0.71 mg/dL).   Assessment: 55 YOF with h/o PVD here with declining mental status. A CT chest showed bilateral PE with R heart strain.   Heparin level now therapeutic at 0.32   Goal of Therapy:  Heparin level 0.3-0.7 units/ml Monitor platelets by anticoagulation protocol: Yes   Plan:  Increase heparin infusion to 1050 units / hr to prevent sub-therapeutic level Next heparin level in AM   Thank you Anette Guarneri, PharmD Clinical Pharmacist **Pharmacist phone directory can now be found on [email or web address] ([email or web address]).  Listed under [place].

## 2021-02-14 NOTE — Consult Note (Signed)
[facility] Nurse wound consult note Consultation was completed by review of records, images and assistance from the bedside nurse/clinical staff.  Reason for Consult:pressure injuries Wound type: Stage 2 Pressure injury; sacrum; 2cm x 2cm x 0.75 (per admission skin assessment) Stage 2 Pressure injury; sacrum; 1cmx 1cm x 0.25 (per admission skin assessment) Pressure Injury POA: Yes Measurement: see above Wound bed: both reported pink, and clean  Drainage (amount, consistency, odor) none Periwound:intact  Dressing procedure/placement/frequency:  Continue silicone foam dressings to both sites per nursing skin care order set.  Low air loss mattress in place while in the ICU, could benefit for air mattress if transfers to the floor. Bladder incontinence being management with external urinary management device. Skin does not appear to be having exposure to stool at this point.   Discussed POC with patient and bedside nurse.  Re consult if needed, will not follow at this time. Thanks  Akash Winski [employer], RN,CWOCN, CNS, [place] [phone])

## 2021-02-14 NOTE — Progress Notes (Signed)
  Echocardiogram 2D Echocardiogram has been performed.  [name] [name] 02/14/2021, 2:17 PM

## 2021-02-14 NOTE — Progress Notes (Signed)
ANTICOAGULATION CONSULT NOTE - Initial Consult  Pharmacy Consult for heparin Indication: pulmonary embolus  No Known Allergies  Patient Measurements: Weight: 53.8 kg (118 lb 9.7 oz) Heparin Dosing Weight: 60 kg   Vital Signs: Temp: 98 F (36.7 C) (12/12 0716) Temp Source: Oral (12/12 0716) BP: 97/61 (12/12 0600) Pulse Rate: 84 (12/12 0600)  Labs: Recent Labs    02/12/21 1556 02/12/21 1556 02/12/21 2124 02/13/21 0329 02/13/21 1250 02/14/21 0500 02/14/21 0552  HGB 14.6  --   --  11.0*  --  9.5*  --   HCT 44.7  --   --  33.2*  --  28.0*  --   PLT 180  --   --  158  --  143*  --   APTT 30  --   --   --   --   --   --   LABPROT 16.6*  --   --   --   --   --   --   INR 1.3*  --   --   --   --   --   --   HEPARINUNFRC  --    < >  --  1.06* 0.49 0.18* 0.17*  CREATININE 1.30*  --   --  0.95  --   --   --   TROPONINIHS  --   --  853*  --   --   --   --    < > = values in this interval not displayed.     Estimated Creatinine Clearance: 31.2 mL/min (by C-G formula based on SCr of 0.95 mg/dL).   Medical History: Past Medical History:  Diagnosis Date   Allergy    Arthritis    Asthma    Cataract    Diverticulosis    Dyslipidemia    GERD (gastroesophageal reflux disease)    Hemorrhoids    History of atrial fibrillation    Hypertension    Menopause    Obesity    PVD (peripheral vascular disease) (HCC)     Medications:  Medications Prior to Admission  Medication Sig Dispense Refill Last Dose   acetaminophen (TYLENOL) 650 MG CR tablet Take 650-1,300 mg by mouth every 8 (eight) hours as needed (arthritis pain).   Past Month   aspirin EC 81 MG tablet Take 81 mg by mouth daily.   02/11/2021   atenolol-chlorthalidone (TENORETIC) 100-25 MG tablet Take 1 tablet by mouth daily. 90 tablet 3 02/11/2021   ELDERBERRY PO Take 1 tablet by mouth daily.   02/11/2021   memantine (NAMENDA XR) 14 MG CP24 24 hr capsule Take 1 capsule (14 mg total) by mouth daily. 30 capsule 11 02/11/2021    Multiple Vitamin (MULITIVITAMIN WITH MINERALS) TABS Take 1 tablet by mouth daily.   02/11/2021   Pyridoxine HCl (VITAMIN B-6 PO) Take 1 tablet by mouth daily.   02/11/2021   simvastatin (ZOCOR) 40 MG tablet Take 1 tablet (40 mg total) by mouth at bedtime. 90 tablet 3 02/11/2021   spironolactone (ALDACTONE) 25 MG tablet Take 1 tablet (25 mg total) by mouth daily. 90 tablet 3 02/11/2021   triamcinolone cream (KENALOG) 0.5 % APPLY TOPICALLY 3 TIMES  DAILY (Patient taking differently: Apply 1 application topically 3 (three) times daily as needed (rash). APPLY TOPICALLY 3 TIMES  DAILY) 240 g 3 unk    Assessment: 66 YOF with h/o PVD here with declining mental status. A CT chest showed bilateral PE with R heart strain.   -heparin  level= 0.17 on 800 units/hr -hg 11> 9.5 (fluid positive so may be dilutional)   Goal of Therapy:  Heparin level 0.3-0.7 units/ml Monitor platelets by anticoagulation protocol: Yes   Plan:  -No heparin bolus -Increase infusion to 950 units/hr -heparin level in 8 hrs  Hildred Laser, PharmD Clinical Pharmacist **Pharmacist phone directory can now be found on [email or web address] ([email or web address]).  Listed under [place].

## 2021-02-14 NOTE — Progress Notes (Signed)
Initial Nutrition Assessment  DOCUMENTATION CODES:   Severe malnutrition in context of chronic illness  INTERVENTION:   - Liberalize diet to Regular, verbal with readback order placed per NP  - Feeding assistance TID with meals  - Ensure Enlive po BID, each supplement provides 350 kcal and 20 grams of protein  - Magic Cup TID with meals, each supplement provides 290 kcal and 9 grams of protein  - MVI with minerals daily  NUTRITION DIAGNOSIS:   Severe Malnutrition related to chronic illness (dementia) as evidenced by severe muscle depletion, severe fat depletion, percent weight loss (10.8% weight loss in 3 months).  GOAL:   Patient will meet greater than or equal to 90% of their needs  MONITOR:   PO intake, Supplement acceptance, Labs, Weight trends, Skin  REASON FOR ASSESSMENT:   Consult Assessment of nutrition requirement/status, Poor PO, Wound healing  ASSESSMENT:   87 year old female who presented to the ED on 12/10 with declining mental status x 1 week. PMH of dementia, atrial fibrillation, GERD, HTN, PVD, HLD, OSA. Pt found to have bilateral pulmonary embolism, AKI, shock.  12/11 - s/p pulmonary arteriography, catheter-directed pulmonary thrombectomy, diet advanced to Heart Healthy  Spoke with pt at bedside. Pt unable to provide any diet or weight related history at this time. Noted untouched breakfast meal tray in room. RD to order feeding assistance with meals. Will also order oral nutrition supplements to aid pt in meeting kcal and protein needs.  Pt with stage II pressure injuries. Will order daily MVI with minerals to aid pt in meeting kcal and protein needs.  Reviewed available weight history in chart. Pt with a 6.5 kg weight loss since 11/16/20. This is a 10.8% weight loss in 3 months which is severe and significant for timeframe. Pt meets criteria for severe malnutrition.  Meal Completion: 10% x 1 documented meal on 12/11  Medications reviewed and include:  IV protonix, IV abx, heparin drip, levophed drip  Labs reviewed: potassium 3.2, BUN 26, hemoglobin 9.5, platelets 143 CBG's: 161  UOP: 700 ml x 24 hours I/O's: +7.9 L since admit  NUTRITION - FOCUSED PHYSICAL EXAM:  Flowsheet Row Most Recent Value  Orbital Region Severe depletion  Upper Arm Region Moderate depletion  Thoracic and Lumbar Region Moderate depletion  Buccal Region Severe depletion  Temple Region Severe depletion  Clavicle Bone Region Severe depletion  Clavicle and Acromion Bone Region Severe depletion  Scapular Bone Region Severe depletion  Dorsal Hand Moderate depletion  Patellar Region Moderate depletion  Anterior Thigh Region Severe depletion  Posterior Calf Region Severe depletion  Edema (RD Assessment) None  Hair Reviewed  Eyes Reviewed  Mouth Reviewed  Skin Reviewed  Nails Reviewed       Diet Order:   Diet Order             Diet regular Room service appropriate? Yes; Fluid consistency: Thin  Diet effective now                   EDUCATION NEEDS:   Not appropriate for education at this time  Skin:  Skin Assessment: Skin Integrity Issues:: Stage II: sacrum, R buttocks Incisions: groin  Last BM:  no documented BM  Height:   Ht Readings from Last 1 Encounters:  10/12/20 4\' 11"  (1.499 m)    Weight:   Wt Readings from Last 1 Encounters:  02/13/21 53.8 kg    BMI:  Body mass index is 23.96 kg/m.  Estimated Nutritional Needs:   Kcal:  1400-1600  Protein:  75-85 grams  Fluid:  1.4-1.6 L    Gustavus Bryant, MS, RD, LDN Inpatient Clinical Dietitian Please see AMiON for contact information.

## 2021-02-15 ENCOUNTER — Other Ambulatory Visit (HOSPITAL_COMMUNITY): Payer: Self-pay

## 2021-02-15 DIAGNOSIS — I2699 Other pulmonary embolism without acute cor pulmonale: Secondary | ICD-10-CM

## 2021-02-15 LAB — CBC
HCT: 26.6 % — ABNORMAL LOW (ref 36.0–46.0)
Hemoglobin: 9 g/dL — ABNORMAL LOW (ref 12.0–15.0)
MCH: 32.8 pg (ref 26.0–34.0)
MCHC: 33.8 g/dL (ref 30.0–36.0)
MCV: 97.1 fL (ref 80.0–100.0)
Platelets: 135 10*3/uL — ABNORMAL LOW (ref 150–400)
RBC: 2.74 MIL/uL — ABNORMAL LOW (ref 3.87–5.11)
RDW: 12.6 % (ref 11.5–15.5)
WBC: 11.4 10*3/uL — ABNORMAL HIGH (ref 4.0–10.5)
nRBC: 0 % (ref 0.0–0.2)

## 2021-02-15 LAB — BASIC METABOLIC PANEL
Anion gap: 7 (ref 5–15)
BUN: 15 mg/dL (ref 8–23)
CO2: 21 mmol/L — ABNORMAL LOW (ref 22–32)
Calcium: 8.4 mg/dL — ABNORMAL LOW (ref 8.9–10.3)
Chloride: 104 mmol/L (ref 98–111)
Creatinine, Ser: 0.62 mg/dL (ref 0.44–1.00)
GFR, Estimated: >60 mL/min (ref >60)
Glucose, Bld: 104 mg/dL — ABNORMAL HIGH (ref 70–99)
Potassium: 3 mmol/L — ABNORMAL LOW (ref 3.5–5.1)
Sodium: 132 mmol/L — ABNORMAL LOW (ref 135–145)

## 2021-02-15 LAB — PROCALCITONIN: Procalcitonin: <0.1 ng/mL

## 2021-02-15 LAB — MAGNESIUM: Magnesium: 2.1 mg/dL (ref 1.7–2.4)

## 2021-02-15 LAB — SURGICAL PATHOLOGY

## 2021-02-15 LAB — HEPARIN LEVEL (UNFRACTIONATED): Heparin Unfractionated: 0.44 IU/mL (ref 0.30–0.70)

## 2021-02-15 LAB — PHOSPHORUS: Phosphorus: 1.9 mg/dL — ABNORMAL LOW (ref 2.5–4.6)

## 2021-02-15 MED ORDER — POTASSIUM CHLORIDE CRYS ER 20 MEQ PO TBCR
20.0000 meq | EXTENDED_RELEASE_TABLET | Freq: Once | ORAL | Status: AC
  Administered 2021-02-15: 20 meq via ORAL
  Filled 2021-02-15: qty 1

## 2021-02-15 MED ORDER — APIXABAN 5 MG PO TABS
5.0000 mg | ORAL_TABLET | Freq: Two times a day (BID) | ORAL | Status: DC
  Administered 2021-02-22 – 2021-02-23 (×3): 5 mg via ORAL
  Filled 2021-02-15 (×4): qty 1

## 2021-02-15 MED ORDER — POTASSIUM PHOSPHATES 15 MMOLE/5ML IV SOLN
30.0000 mmol | Freq: Once | INTRAVENOUS | Status: AC
  Administered 2021-02-15: 30 mmol via INTRAVENOUS
  Filled 2021-02-15: qty 10

## 2021-02-15 MED ORDER — APIXABAN 5 MG PO TABS
10.0000 mg | ORAL_TABLET | Freq: Two times a day (BID) | ORAL | Status: AC
  Administered 2021-02-15 – 2021-02-21 (×13): 10 mg via ORAL
  Filled 2021-02-15 (×14): qty 2

## 2021-02-15 NOTE — Progress Notes (Signed)
NAME:  [name] [name], MRN:  [id number], DOB:  October 19, 1931, LOS: 3 ADMISSION DATE:  02/12/2021, CONSULTATION DATE:  12/10 REFERRING MD:  Yvonne Kendall , CHIEF COMPLAINT:  AMS   History of Present Illness:  Patient is encephalopathic. Therefore history has been obtained from chart review.    [name] [name], is a 87 y.o. female, who presented to the [facility] ED with a chief complaint of AMS  They have a pertinent past medical history of Afib not on AC, GERD, HTN, PVD, HLD on ASA 81, OSA, dementia  Reportedly since 12/6 they have had steady mental decline at home, with the patient not eating or drinking.  ED course was notable for being lethargic and febrile on arrival. Tmax 104.1, WBC 12.4. CTA chest was obtained which was concerning for PE in right and left pulmonary arteries, lobar arteries, and multiple bilateral segmental and subsegmental pulmonary arteries. Concern for RV strain. UA was positive. She became increasingly hypotensive in the ED. She was given 2L IVF and started on a levophed gtt. Code sepsis was called Blood cultures were ordered. Started on cefepime and vancomycin in the ED.  PCCM was consulted for admission and management.   Pertinent  Medical History  Afib, GERD, HTN, PVD, HLD  Significant Hospital Events: Including procedures, antibiotic start and stop dates in addition to other pertinent events   12/10 Admit, large BL PE, +UA, Vanc cefepime, to IR for thrombectomy Underwent thrombectomy under MAC 12/11 U/S BLE 12/11 > +left femoral, popliteal, posterior tibial vein DVT  TTE 12/11 >> LV normal fxn, Grade 1 diastolic dysfunction, RV function moderately reduced, RV size moderately enlarged, pericardial effusion is posterior to the left ventricle, MV is abnormal with regurgitation , no stenosis, Aortic valve tricuspid with mild calcification and mild regurgitation , sclerosis/calcification is present, without any evidence of aortic  stenosis.  Inferior vena cava is normal size,  suggesting RAP of 3 mm Hg.   Interim History / Subjective:  Off pressors 12/13 am, alert to self, eating small amounts  K 3.0/ Phos 1.9>> active repletion 12/13. am  WBC is 11.4, cultures still pending, HGB 9.0 was 9.5 , platelets 135K, were 143 K >> No bleeding noted  + 9944 cc's since admission, ? Hemo dilutional changes UO last 24 hours 2L  Creatinine 0.62  Objective   Blood pressure 101/71, pulse 79, temperature 97.8 F (36.6 C), temperature source Oral, resp. rate 12, weight 53 kg, SpO2 100 %.        Intake/Output Summary (Last 24 hours) at 02/15/2021 0930 Last data filed at 02/15/2021 0800 Gross per 24 hour  Intake 1945.68 ml  Output 400 ml  Net 1545.68 ml   Filed Weights   02/13/21 0407 02/15/21 0500  Weight: 53.8 kg 53 kg    Examination: General: elderly female lying in bed, on RA, no distress  HEENT: Dry MM , No LAD, no JVD Neuro: alert, oriented, to self, disoriented to location, time, situation, pleasantly confused CV: HR 78, irregular, no MRG PULM: Bilateral chest excursion, Clear throughout, , no use of accessory muscles , no nasal flaring , RR 12 GI: Nondistended, non-tender, active bowel sounds  Extremities: No edema, arterial line left radial with good waveform Skin: Stage 2 PI to sacrum per [facility] Problem list     Assessment & Plan:   Acute encephalopathy presumed in setting of shock, UTI >> Improving  HX dementia -Resume home namenda  Shock- multifactorial with obstructive in setting of PE  and Sepsis with UTI   -CTA Chest, abd, pelvis with PE in right and left pulmonary arteries, lobar arteries, and multiple bilateral segmental and subsegmental pulmonary arteries -S/P Thrombectomy 12/11  -+DVT to Left femoral, popliteal, posterior tibial vein DVT  Plan -Titrate Levophed for MAP goal >65 (>> pressors off 12/13 am -Continuous Cardiac Monitoring  -TEE results noted above -Continue Heparin  -Follow Culture Data  -Continue  Cefepime/Vancomycin for now >> Urine Culture not obtained at admission, U/A +Nitrates with many bacteria >> day 3 of BC, if remain negative can de-escalate   HX HTN HX HLD HX PVD Plan -Aspirin on hold, restart when okay with interventional radiology -Antihypertensives on hold -Continue statin  Afib, not on AC No RVR. Plan -On heparin infusion, will convert to [place] 12/13 per pharmacy -Beta-blockade currently on hold given hypotension>> BP remains soft off pressors, resume after  BP has returned to baseline  AKI, improved Lactic acidosis in setting of shock/hypoperfusion > Resolved Concern for UTI  Creat 1.30, 0.75 on 8/22. BUN 31. Air seen in bladder on CTA. Was straight cathed. UA + for nitrite. Suspect UTI  Plan -Follow urine output, BMP -Replace electrolytes as indicated  -Antibiotics as above   HX GERD Plan -PPI   Pressure injury- PTA Plan -WOC  Malnutrition- unspecified Albumin 3.1 Poor intake   Plan -Nutrition consult  As patient is off pressors we will transfer out of ICU to med surg tele bed. Per nursing, patient is not trying to get out of bed unassisted. Will transition to [place] today from heparin gtt.  Doubt she will be able to live alone upon dc. May need social work consult per families ability to care for her at home vs needing help.   Best Practice (right click and "Reselect all SmartList Selections" daily)   Diet/type: Heart Diet  DVT prophylaxis: systemic heparin GI prophylaxis: PPI Lines: Left Radial Aline.  Foley:  Yes, and it is still needed Code Status:  full code Last date of multidisciplinary goals of care discussion [12/10 spoke with daughter, full scope of care.]  Labs   CBC: Recent Labs  Lab 02/12/21 1556 02/13/21 0329 02/14/21 0500 02/15/21 0505  WBC 12.4* 12.8* 12.5* 11.4*  NEUTROABS 9.6*  --   --   --   HGB 14.6 11.0* 9.5* 9.0*  HCT 44.7 33.2* 28.0* 26.6*  MCV 101.4* 98.5 96.2 97.1  PLT 180 158 143* 135*    Basic  Metabolic Panel: Recent Labs  Lab 02/12/21 1556 02/13/21 0329 02/14/21 0922 02/15/21 0505  NA 138 135 136 132*  K 3.4* 3.2* 3.3* 3.0*  CL 105 105 106 104  CO2 20* 21* 21* 21*  GLUCOSE 112* 173* 102* 104*  BUN 31* 26* 18 15  CREATININE 1.30* 0.95 0.71 0.62  CALCIUM 9.3 8.1* 8.5* 8.4*  MG  --  1.7 1.8 2.1  PHOS  --  2.9 1.5* 1.9*   GFR: Estimated Creatinine Clearance: 36.8 mL/min (by C-G formula based on SCr of 0.62 mg/dL). Recent Labs  Lab 02/12/21 1556 02/12/21 2124 02/13/21 0329 02/13/21 1335 02/13/21 1723 02/14/21 0500 02/15/21 0505  PROCALCITON  --   --   --  0.14  --  0.11 <0.10  WBC 12.4*  --  12.8*  --   --  12.5* 11.4*  LATICACIDVEN 2.0* 4.4*  --  1.4 1.3  --   --     Liver Function Tests: Recent Labs  Lab 02/12/21 1556  AST 29  ALT 14  ALKPHOS  58  BILITOT 1.7*  PROT 7.4  ALBUMIN 3.1*   Recent Labs  Lab 02/12/21 1556  LIPASE 22   No results for input(s): AMMONIA in the last 168 hours.  ABG    Component Value Date/Time   TCO2 27 10/15/2017 1845     Coagulation Profile: Recent Labs  Lab 02/12/21 1556  INR 1.3*    Cardiac Enzymes: No results for input(s): CKTOTAL, CKMB, CKMBINDEX, TROPONINI in the last 168 hours.  HbA1C: No results found for: HGBA1C  CBG: Recent Labs  Lab 02/13/21 0202  GLUCAP 161*     Critical care time:  33 minutes    Magdalen Spatz, MSN, AGACNP-BC [place] for personal pager PCCM on call pager [phone]  02/15/2021 9:55 AM

## 2021-02-15 NOTE — Progress Notes (Signed)
REcieved patient from 54M, alert but confused, incontinent. Placed patient in room 6N6, in bed. Call light within reach, fall precaution in place. Will continue to monitor.

## 2021-02-15 NOTE — Progress Notes (Addendum)
ANTICOAGULATION CONSULT NOTE  Pharmacy Consult:  Heparin Indication: pulmonary embolus and DVT  No Known Allergies  Patient Measurements: Weight: 53 kg (116 lb 13.5 oz) Heparin Dosing Weight: 60 kg   Vital Signs: Temp: 97.8 F (36.6 C) (12/13 0710) Temp Source: Oral (12/13 0710) BP: 101/71 (12/13 0800) Pulse Rate: 79 (12/13 0800)  Labs: Recent Labs    02/12/21 1556 02/12/21 2124 02/13/21 0329 02/13/21 1250 02/14/21 0500 02/14/21 0552 02/14/21 0922 02/14/21 1520 02/15/21 0505  HGB 14.6  --  11.0*  --  9.5*  --   --   --  9.0*  HCT 44.7  --  33.2*  --  28.0*  --   --   --  26.6*  PLT 180  --  158  --  143*  --   --   --  135*  APTT 30  --   --   --   --   --   --   --   --   LABPROT 16.6*  --   --   --   --   --   --   --   --   INR 1.3*  --   --   --   --   --   --   --   --   HEPARINUNFRC  --   --  1.06*   < > 0.18* 0.17*  --  0.[name] 0.44  CREATININE 1.30*  --  0.95  --   --   --  0.71  --  0.62  TROPONINIHS  --  853*  --   --   --   --  437*  --   --    < > = values in this interval not displayed.     Estimated Creatinine Clearance: 36.8 mL/min (by C-G formula based on SCr of 0.62 mg/dL).   Assessment: [name] [name] with history of PVD here with declining mental status.  Found to have bilateral PEs with RHS and left DVT.  Patient is s/p thrombectomy on 12/11.  Pharmacy consulted to dose IV heparin.  Heparin level therapeutic; no bleeding reported although CBC trending down.  Goal of Therapy:  Heparin level 0.3-0.7 units/ml Monitor platelets by anticoagulation protocol: Yes   Plan:  Continue heparin gtt at 950 units/hr Daily heparin level and CBC F/U with transitioning to [place] D. Mina Marble, PharmD, BCPS, [place] 02/15/2021, 9:26 AM   ===============================  Addendum: Transition patient to Eliquis 10mg  PO BID x 7 days, then 12/20 start 5mg  BID Stop IV heparin when first dose of Eliquis is given  Dorlis Judice D. Mina Marble, PharmD, BCPS, [place] 02/15/2021, 10:02  AM

## 2021-02-15 NOTE — Anesthesia Postprocedure Evaluation (Signed)
Anesthesia Post Note  Patient: [name] [name]  Procedure(s) Performed: IR WITH ANESTHESIA     Patient location during evaluation: SICU Anesthesia Type: MAC Level of consciousness: sedated Pain management: pain level controlled Vital Signs Assessment: post-procedure vital signs reviewed and stable Respiratory status: patient remains intubated per anesthesia plan Cardiovascular status: stable Postop Assessment: no apparent nausea or vomiting Anesthetic complications: no   No notable events documented.  Last Vitals:  Vitals:   02/15/21 0800 02/15/21 1117  BP: 101/71   Pulse: 79   Resp: 12   Temp:  36.6 C  SpO2: 100%     Last Pain:  Vitals:   02/15/21 1117  TempSrc: Axillary  PainSc:                  March Rummage Biviana Saddler

## 2021-02-15 NOTE — Progress Notes (Signed)
[facility] ADULT ICU REPLACEMENT PROTOCOL   The patient does apply for the [facility] Adult ICU Electrolyte Replacment Protocol based on the criteria listed below:   1.Exclusion criteria: TCTS patients, ECMO patients, and Dialysis patients 2. Is GFR >/= 30 ml/min? Yes.    Patient's GFR today is >60 3. Is SCr </= 2? Yes.   Patient's SCr is 0.62 mg/dL 4. Did SCr increase >/= 0.5 in 24 hours? No. 5.Pt's weight >40kg  Yes.   6. Abnormal electrolyte(s): K 3.0, Phos 1.9  7. Electrolytes replaced per protocol   [name] [name] 02/15/2021 6:18 AM

## 2021-02-15 NOTE — TOC Benefit Eligibility Note (Signed)
Patient Teacher, English as a foreign language completed.    The patient is currently admitted and upon discharge could be taking Xarelto 20 mg.  The current 30 day co-pay is, $9.85.   The patient is currently admitted and upon discharge could be taking Eliquis 5 mg.  The current 30 day co-pay is, $9.85.   The patient is insured through [place], [place] Patient Advocate Specialist [place] Patient Advocate Team Direct Number: [phone]  Fax: [phone]

## 2021-02-16 LAB — CBC WITH DIFFERENTIAL/PLATELET
Abs Immature Granulocytes: 0.07 10*3/uL (ref 0.00–0.07)
Basophils Absolute: 0 10*3/uL (ref 0.0–0.1)
Basophils Relative: 0 %
Eosinophils Absolute: 0.1 10*3/uL (ref 0.0–0.5)
Eosinophils Relative: 1 %
HCT: 27.1 % — ABNORMAL LOW (ref 36.0–46.0)
Hemoglobin: 9.2 g/dL — ABNORMAL LOW (ref 12.0–15.0)
Immature Granulocytes: 1 %
Lymphocytes Relative: 14 %
Lymphs Abs: 1.3 10*3/uL (ref 0.7–4.0)
MCH: 33.1 pg (ref 26.0–34.0)
MCHC: 33.9 g/dL (ref 30.0–36.0)
MCV: 97.5 fL (ref 80.0–100.0)
Monocytes Absolute: 0.8 10*3/uL (ref 0.1–1.0)
Monocytes Relative: 9 %
Neutro Abs: 7.4 10*3/uL (ref 1.7–7.7)
Neutrophils Relative %: 75 %
Platelets: 152 10*3/uL (ref 150–400)
RBC: 2.78 MIL/uL — ABNORMAL LOW (ref 3.87–5.11)
RDW: 12.7 % (ref 11.5–15.5)
WBC: 9.7 10*3/uL (ref 4.0–10.5)
nRBC: 0 % (ref 0.0–0.2)

## 2021-02-16 LAB — BASIC METABOLIC PANEL
Anion gap: 5 (ref 5–15)
BUN: 9 mg/dL (ref 8–23)
CO2: 24 mmol/L (ref 22–32)
Calcium: 8.4 mg/dL — ABNORMAL LOW (ref 8.9–10.3)
Chloride: 104 mmol/L (ref 98–111)
Creatinine, Ser: 0.57 mg/dL (ref 0.44–1.00)
GFR, Estimated: >60 mL/min (ref >60)
Glucose, Bld: 94 mg/dL (ref 70–99)
Potassium: 3.1 mmol/L — ABNORMAL LOW (ref 3.5–5.1)
Sodium: 133 mmol/L — ABNORMAL LOW (ref 135–145)

## 2021-02-16 LAB — PHOSPHORUS: Phosphorus: 2 mg/dL — ABNORMAL LOW (ref 2.5–4.6)

## 2021-02-16 LAB — MAGNESIUM: Magnesium: 1.8 mg/dL (ref 1.7–2.4)

## 2021-02-16 MED ORDER — MAGNESIUM SULFATE 2 GM/50ML IV SOLN
2.0000 g | Freq: Once | INTRAVENOUS | Status: AC
  Administered 2021-02-16: 14:00:00 2 g via INTRAVENOUS
  Filled 2021-02-16: qty 50

## 2021-02-16 MED ORDER — SODIUM PHOSPHATES 45 MMOLE/15ML IV SOLN
30.0000 mmol | Freq: Once | INTRAVENOUS | Status: AC
  Administered 2021-02-16: 11:00:00 30 mmol via INTRAVENOUS
  Filled 2021-02-16: qty 10

## 2021-02-16 MED ORDER — POTASSIUM CHLORIDE CRYS ER 20 MEQ PO TBCR
40.0000 meq | EXTENDED_RELEASE_TABLET | Freq: Once | ORAL | Status: AC
  Administered 2021-02-16: 11:00:00 40 meq via ORAL
  Filled 2021-02-16: qty 2

## 2021-02-16 NOTE — Discharge Instructions (Addendum)
Information on my medicine - ELIQUIS (apixaban)  This medication education was reviewed with me or my healthcare representative as part of my discharge preparation.    Why was Eliquis prescribed for you? Eliquis was prescribed to treat blood clots that may have been found in the veins of your legs (deep vein thrombosis) or in your lungs (pulmonary embolism) and to reduce the risk of them occurring again.  What do You need to know about Eliquis ? The starting dose is 10 mg (two 5 mg tablets) taken TWICE daily for the FIRST SEVEN (7) DAYS, then on Tuesday 02/22/21 the dose is reduced to ONE 5 mg tablet taken TWICE daily.  Eliquis may be taken with or without food.   Try to take the dose about the same time in the morning and in the evening. If you have difficulty swallowing the tablet whole please discuss with your pharmacist how to take the medication safely.  Take Eliquis exactly as prescribed and DO NOT stop taking Eliquis without talking to the doctor who prescribed the medication.  Stopping may increase your risk of developing a new blood clot.  Refill your prescription before you run out.  After discharge, you should have regular check-up appointments with your healthcare provider that is prescribing your Eliquis.    What do you do if you miss a dose? If a dose of ELIQUIS is not taken at the scheduled time, take it as soon as possible on the same day and twice-daily administration should be resumed. The dose should not be doubled to make up for a missed dose.  Important Safety Information A possible side effect of Eliquis is bleeding. You should call your healthcare provider right away if you experience any of the following: Bleeding from an injury or your nose that does not stop. Unusual colored urine (red or dark brown) or unusual colored stools (red or black). Unusual bruising for unknown reasons. A serious fall or if you hit your head (even if there is no bleeding).  Some  medicines may interact with Eliquis and might increase your risk of bleeding or clotting while on Eliquis. To help avoid this, consult your healthcare provider or pharmacist prior to using any new prescription or non-prescription medications, including herbals, vitamins, non-steroidal anti-inflammatory drugs (NSAIDs) and supplements.  This website has more information on Eliquis (apixaban): http://www.eliquis.com/eliquis/home

## 2021-02-16 NOTE — Evaluation (Signed)
Physical Therapy Evaluation Patient Details Name: [name] [name] MRN: [id number] DOB: 29-Jun-1931 Today's Date: 02/16/2021  History of Present Illness  Patient is an 87 y/o female admitted with AMS.  PMH positive for a-fib, GERD, HTN, PVD, HLD, OSA and dementia. 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  Clinical Impression  Patient presents likely with some decline in mobility.  Family not present and pt unable to state prior level.  Likely had help at home, but currently immobile and developing contractures.  Will benefit from skilled PT in the acute setting for maximizing mobility, balance and for caregiver education.  May benefit from follow up HHPT for further education.      Recommendations for follow up therapy are one component of a multi-disciplinary discharge planning process, led by the attending physician.  Recommendations may be updated based on patient status, additional functional criteria and insurance authorization.  Follow Up Recommendations Home health PT    Assistance Recommended at Discharge Intermittent Supervision/Assistance  Functional Status Assessment Patient has had a recent decline in their functional status and/or demonstrates limited ability to make significant improvements in function in a reasonable and predictable amount of time  Equipment Recommendations  Other (comment) (hoyer lift)    Recommendations for Other Services       Precautions / Restrictions Precautions Precautions: Fall      Mobility  Bed Mobility Overal bed mobility: Needs Assistance Bed Mobility: Supine to Sit;Sit to Supine     Supine to sit: Max assist;HOB elevated Sit to supine: Total assist   General bed mobility comments: moving legs off bed and lifting trunk to sit; to supine assist for legs and sliding over and up with bed pad    Transfers                   General  transfer comment: NT    Ambulation/Gait                  Stairs            Wheelchair Mobility    Modified Rankin (Stroke Patients Only)       Balance Overall balance assessment: Needs assistance Sitting-balance support: Feet unsupported Sitting balance-Leahy Scale: Poor Sitting balance - Comments: mod to max A for sitting balance EOB about 5 minutes prior to pt leaning R more                                     Pertinent Vitals/Pain Pain Assessment: Faces Faces Pain Scale: Hurts even more Pain Location: legs when moving in bed Pain Descriptors / Indicators: Moaning;Discomfort;Guarding Pain Intervention(s): Repositioned;Limited activity within patient's tolerance    Home Living Family/patient expects to be discharged to:: Private residence   Available Help at Discharge: Family               Additional Comments: Prior level unknown as family not present and pt poor historian    Prior Function Prior Level of Function : Needs assist                     Hand Dominance        Extremity/Trunk Assessment   Upper Extremity Assessment Upper Extremity Assessment: Generalized weakness    Lower Extremity Assessment Lower Extremity Assessment: LLE deficits/detail;RLE deficits/detail RLE Deficits /  Details: keeps legs flexed under her in the bed, tight hamstrings and heel cords with less than antigravity strength; can extend mostly in bed with some c/o pain LLE Deficits / Details: keeps legs flexed under her in the bed, tight hamstrings and heel cords with less than antigravity strength; can extend mostly in bed with some c/o pain    Cervical / Trunk Assessment Cervical / Trunk Assessment: Kyphotic;Other exceptions Cervical / Trunk Exceptions: kyphoscoliosis with leaning R in sitting  Communication      Cognition Arousal/Alertness: Awake/alert Behavior During Therapy: WFL for tasks assessed/performed Overall Cognitive Status:  No family/caregiver present to determine baseline cognitive functioning                                 General Comments: history of dementia, may be at baseline        General Comments General comments (skin integrity, edema, etc.): placed pillow between legs, no noted skin breakdown, but high risk    Exercises     Assessment/Plan    PT Assessment Patient needs continued PT services  PT Problem List Decreased strength;Decreased cognition;Decreased balance;Decreased mobility;Decreased range of motion       PT Treatment Interventions Balance training;Functional mobility training;Patient/family education;Therapeutic activities;Therapeutic exercise    PT Goals (Current goals can be found in the Care Plan section)  Acute Rehab PT Goals Patient Stated Goal: none stated PT Goal Formulation: Patient unable to participate in goal setting Time For Goal Achievement: 03/02/21 Potential to Achieve Goals: Fair    Frequency Min 2X/week   Barriers to discharge        Co-evaluation               AM-PAC PT "6 Clicks" Mobility  Outcome Measure Help needed turning from your back to your side while in a flat bed without using bedrails?: Total Help needed moving from lying on your back to sitting on the side of a flat bed without using bedrails?: Total Help needed moving to and from a bed to a chair (including a wheelchair)?: Total Help needed standing up from a chair using your arms (e.g., wheelchair or bedside chair)?: Total Help needed to walk in hospital room?: Total Help needed climbing 3-5 steps with a railing? : Total 6 Click Score: 6    End of Session   Activity Tolerance: Patient limited by pain Patient left: in bed;with bed alarm set;with call bell/phone within reach   PT Visit Diagnosis: Muscle weakness (generalized) (M62.81);Other abnormalities of gait and mobility (R26.89)    Time: 8891-6945 PT Time Calculation (min) (ACUTE ONLY): 30  min   Charges:   PT Evaluation $PT Eval Moderate Complexity: 1 Mod PT Treatments $Therapeutic Activity: 8-22 mins        Magda Kiel, PT Acute Rehabilitation Services WTUUE:[phone] Office:[phone] 02/16/2021   Reginia Naas 02/16/2021, 4:50 PM

## 2021-02-16 NOTE — Progress Notes (Signed)
PROGRESS NOTE    [name]  KTG:[id number] DOB: 24-Jan-1932 DOA: 02/12/2021 PCP: Denita Lung, MD   Brief Narrative: 87 year old past medical history significant for A. fib not on anticoagulation, GERD, hypertension, PVD, OSA, dementia who has had since 12/6 mental decline at home, poor oral intake.  [name] [name] presented lethargic, febrile on arrival temperature 104, leukocytosis, CTA chest was obtained which was concerning for PE and right and left pulmonary artery, lobar arterial and multiple bilateral segmental and subsegmental PE.  Concern for RV strain.  She became hypotensive in the ED.  Received IV fluid and started on Levophed.  Started on broad-spectrum antibiotics.  CCM admitted [name] [name].  Hospital Course:  12/10 Admit, large BL PE, +UA, Vanc cefepime, to IR for thrombectomy Underwent thrombectomy under MAC 12/11 U/S BLE 12/11 > +left femoral, popliteal, posterior tibial vein DVT  TTE 12/11 >> LV normal fxn, Grade 1 diastolic dysfunction, RV function moderately reduced, RV size moderately enlarged, pericardial effusion is posterior to the left ventricle, MV is abnormal with regurgitation , no stenosis, Aortic valve tricuspid with mild calcification and mild regurgitation , sclerosis/calcification is present, without any evidence of aortic  stenosis.  Inferior vena cava is normal size, suggesting RAP of 3 mm Hg.    Assessment & Plan:   Principal Problem:   Pulmonary embolism (HCC) Active Problems:   Pressure injury of skin   Protein-calorie malnutrition, severe  1-Acute metabolic encephalopathy in the setting of sepsis shock and UTI improving Dementia: Continue with Namenda   2-Shock multifactorial secondary to PE and sepsis secondary to UTI. -[name] [name] was weaned off of Levophed. -Blood cultures: -Urine culture, not obtained. -IV antibiotics, ceftriaxone. Plan to treat for 5 days.   3-Submassive PE, lower extremity DVT: Status post thrombectomy by IR 12/11 She was  initially treated with heparin drip, now transition to Eliquis  A. fib, hypertension, PVD: Aspirin on hold Continue with statins Blocker on hold due to hypotension on admission  Hypokalemia: Replete orally.  Replete mg.  Hypophosphatemia; replete IV>   Anemia of critical illness: Hb stable.  Check b12. .   Pressure injury sacral stage II POA Buttock stage II POA Wound care  AKI: Cr peak to 1.3 Improved with support.   Malnutrition Nutrition consulted  GERD: Continue with PPI  Feet pain: Continue with Tylenol.     Pressure Injury 02/13/21 Sacrum Mid Stage 2 -  Partial thickness loss of dermis presenting as a shallow open injury with a red, pink wound bed without slough. pink (Active)  02/13/21 0200  Location: Sacrum  Location Orientation: Mid  Staging: Stage 2 -  Partial thickness loss of dermis presenting as a shallow open injury with a red, pink wound bed without slough.  Wound Description (Comments): pink  Present on Admission: Yes     Pressure Injury 02/13/21 Buttocks Right Stage 2 -  Partial thickness loss of dermis presenting as a shallow open injury with a red, pink wound bed without slough. pink (Active)  02/13/21 0200  Location: Buttocks  Location Orientation: Right  Staging: Stage 2 -  Partial thickness loss of dermis presenting as a shallow open injury with a red, pink wound bed without slough.  Wound Description (Comments): pink  Present on Admission: Yes     Nutrition Problem: Severe Malnutrition Etiology: chronic illness (dementia)    Signs/Symptoms: severe muscle depletion, severe fat depletion, percent weight loss (10.8% weight loss in 3 months) Percent weight loss: 10.8 %    Interventions: Ensure Enlive (each supplement  provides 350kcal and 20 grams of protein), MVI, Magic cup, Liberalize Diet, Other (Comment) (feeding assist)  Estimated body mass index is 25.34 kg/m as calculated from the following:   Height as of 10/12/20: _0   (1.499 m).   Weight as of this encounter: 56.9 kg.   DVT prophylaxis: eliquis Code Status: Full code Family Communication: will update daughter Disposition Plan:  Status is: Inpatient  Remains inpatient appropriate because: PE, shock.         Consultants:  CCM   Procedures:  Thrombectomy   Antimicrobials:    Subjective: She is alert, complaints of feet pain.    Objective: Vitals:   02/16/21 0021 02/16/21 0415 02/16/21 0502 02/16/21 0759  BP: 99/69 (!) 153/128 (!) 103/54 (!) 107/56  Pulse: 84 78 71 77  Resp: _1 Temp: 98.1 F (36.7 C) 98.5 F (36.9 C)  97.8 F (36.6 C)  TempSrc: Oral Oral  Axillary  SpO2: 100% 100%  97%  Weight:  56.9 kg      Intake/Output Summary (Last 24 hours) at 02/16/2021 1308 Last data filed at 02/16/2021 0415 Gross per 24 hour  Intake 120 ml  Output 200 ml  Net -80 ml   Filed Weights   02/13/21 0407 02/15/21 0500 02/16/21 0415  Weight: 53.8 kg 53 kg 56.9 kg    Examination:  General exam: Appears calm and comfortable  Respiratory system: Clear to auscultation. Respiratory effort normal. Cardiovascular system: S1 & S2 heard, RRR.  Gastrointestinal system: Abdomen is nondistended, soft and nontender. No organomegaly or masses felt. Normal bowel sounds heard. Central nervous system: Alert  Extremities: no edema    Data Reviewed: I have personally reviewed following labs and imaging studies  CBC: Recent Labs  Lab 02/12/21 1556 02/13/21 0329 02/14/21 0500 02/15/21 0505 02/16/21 0515  WBC 12.4* 12.8* 12.5* 11.4* 9.7  NEUTROABS 9.6*  --   --   --  7.4  HGB 14.6 11.0* 9.5* 9.0* 9.2*  HCT 44.7 33.2* 28.0* 26.6* 27.1*  MCV 101.4* 98.5 96.2 97.1 97.5  PLT 180 158 143* 135* 546   Basic Metabolic Panel: Recent Labs  Lab 02/12/21 1556 02/13/21 0329 02/14/21 0922 02/15/21 0505 02/16/21 0515  NA 138 135 136 132* 133*  K 3.4* 3.2* 3.3* 3.0* 3.1*  CL 105 105 106 104 104  CO2 20* 21* 21* 21* 24  GLUCOSE 112*  173* 102* 104* 94  BUN 31* 26* _2 CREATININE 1.30* 0.95 0.71 0.62 0.57  CALCIUM 9.3 8.1* 8.5* 8.4* 8.4*  MG  --  1.7 1.8 2.1 1.8  PHOS  --  2.9 1.5* 1.9* 2.0*   GFR: Estimated Creatinine Clearance: 38.1 mL/min (by C-G formula based on SCr of 0.57 mg/dL). Liver Function Tests: Recent Labs  Lab 02/12/21 1556  AST 29  ALT 14  ALKPHOS 58  BILITOT 1.7*  PROT 7.4  ALBUMIN 3.1*   Recent Labs  Lab 02/12/21 1556  LIPASE 22   No results for input(s): AMMONIA in the last 168 hours. Coagulation Profile: Recent Labs  Lab 02/12/21 1556  INR 1.3*   Cardiac Enzymes: No results for input(s): CKTOTAL, CKMB, CKMBINDEX, TROPONINI in the last 168 hours. BNP (last 3 results) No results for input(s): PROBNP in the last 8760 hours. HbA1C: No results for input(s): HGBA1C in the last 72 hours. CBG: Recent Labs  Lab 02/13/21 0202  GLUCAP 161*   Lipid Profile: No results for input(s): CHOL, HDL, LDLCALC, TRIG, CHOLHDL, LDLDIRECT in the  last 72 hours. Thyroid Function Tests: No results for input(s): TSH, T4TOTAL, FREET4, T3FREE, THYROIDAB in the last 72 hours. Anemia Panel: No results for input(s): VITAMINB12, FOLATE, FERRITIN, TIBC, IRON, RETICCTPCT in the last 72 hours. Sepsis Labs: Recent Labs  Lab 02/12/21 1556 02/12/21 2124 02/13/21 1335 02/13/21 1723 02/14/21 0500 02/15/21 0505  PROCALCITON  --   --  0.14  --  0.11 <0.10  LATICACIDVEN 2.0* 4.4* 1.4 1.3  --   --     Recent Results (from the past 240 hour(s))  Culture, blood (Routine x 2)     Status: None (Preliminary result)   Collection Time: 02/12/21  3:56 PM   Specimen: BLOOD RIGHT FOREARM  Result Value Ref Range Status   Specimen Description BLOOD RIGHT FOREARM  Final   Special Requests   Final    BOTTLES DRAWN AEROBIC AND ANAEROBIC Blood Culture results may not be optimal due to an inadequate volume of blood received in culture bottles   Culture   Final    NO GROWTH 4 DAYS Performed at [facility], [place] [address] [place], [place] [address]    Report Status PENDING  Incomplete  Resp Panel by RT-PCR (Flu A&B, Covid)     Status: None   Collection Time: 02/12/21  3:56 PM   Specimen: Nasopharyngeal(NP) swabs in vial transport medium  Result Value Ref Range Status   SARS Coronavirus 2 by RT PCR NEGATIVE NEGATIVE Final    Comment: (NOTE) SARS-CoV-2 target nucleic acids are NOT DETECTED.  The SARS-CoV-2 RNA is generally detectable in upper respiratory specimens during the acute phase of infection. The lowest concentration of SARS-CoV-2 viral copies this assay can detect is 138 copies/mL. A negative result does not preclude SARS-Cov-2 infection and should not be used as the sole basis for treatment or other [name] [name] management decisions. A negative result may occur with  improper specimen collection/handling, submission of specimen other than nasopharyngeal swab, presence of viral mutation(s) within the areas targeted by this assay, and inadequate number of viral copies(<138 copies/mL). A negative result must be combined with clinical observations, [name] [name] history, and epidemiological information. The expected result is Negative.  Fact Sheet for Patients:  EntrepreneurPulse.com.au  Fact Sheet for Healthcare Providers:  IncredibleEmployment.be  This test is no t yet approved or cleared by the [place] FDA and  has been authorized for detection and/or diagnosis of SARS-CoV-2 by FDA under an Emergency Use Authorization (EUA). This EUA will remain  in effect (meaning this test can be used) for the duration of the COVID-19 declaration under Section 564(b)(1) of the Act, 21 U.S.C.section 360bbb-3(b)(1), unless the authorization is terminated  or revoked sooner.       Influenza A by PCR NEGATIVE NEGATIVE Final   Influenza B by PCR NEGATIVE NEGATIVE Final    Comment: (NOTE) The Xpert Xpress SARS-CoV-2/FLU/RSV plus assay is intended as an aid in  the diagnosis of influenza from Nasopharyngeal swab specimens and should not be used as a sole basis for treatment. Nasal washings and aspirates are unacceptable for Xpert Xpress SARS-CoV-2/FLU/RSV testing.  Fact Sheet for Patients: EntrepreneurPulse.com.au  Fact Sheet for Healthcare Providers: IncredibleEmployment.be  This test is not yet approved or cleared by the [place] FDA and has been authorized for detection and/or diagnosis of SARS-CoV-2 by FDA under an Emergency Use Authorization (EUA). This EUA will remain in effect (meaning this test can be used) for the duration of the COVID-19 declaration under Section 564(b)(1) of the Act, 21 U.S.C.  section 360bbb-3(b)(1), unless the authorization is terminated or revoked.  Performed at [facility], [place] [address], [place], [place] [address]   Culture, blood (Routine x 2)     Status: None (Preliminary result)   Collection Time: 02/12/21  5:25 PM   Specimen: Left Antecubital; Blood  Result Value Ref Range Status   Specimen Description LEFT ANTECUBITAL  Final   Special Requests   Final    BOTTLES DRAWN AEROBIC AND ANAEROBIC Blood Culture adequate volume   Culture   Final    NO GROWTH 4 DAYS Performed at [facility], [place] [address], [place], [place] [address]    Report Status PENDING  Incomplete  MRSA Next Gen by PCR, Nasal     Status: None   Collection Time: 02/13/21  7:12 AM   Specimen: Nasal Mucosa; Nasal Swab  Result Value Ref Range Status   MRSA by PCR Next Gen NOT DETECTED NOT DETECTED Final    Comment: (NOTE) The GeneXpert MRSA Assay (FDA approved for NASAL specimens only), is one component of a comprehensive MRSA colonization surveillance program. It is not intended to diagnose MRSA infection nor to guide or monitor treatment for MRSA infections. Test performance is not FDA approved in patients less than 55 years old. Performed at [facility], [place]  [address], [place], [place] [address]          Radiology Studies: ECHOCARDIOGRAM COMPLETE  Result Date: 02/14/2021    ECHOCARDIOGRAM REPORT   [name] [name] Name:   [name] [name] Date of Exam: 02/14/2021 Medical Rec #:  [id number]     Height:       59.0 in Accession #:    [id number]    Weight:       118.6 lb Date of Birth:  03-14-1931     BSA:          1.477 m [name] [name] Age:    33 years      BP:           94/58 mmHg [name] [name] Gender: F             HR:           85 bpm. Exam Location:  Inpatient Procedure: 2D Echo, Cardiac Doppler and Color Doppler Indications:    Pulmonary Embolus I26.09  History:        [name] [name] has no prior history of Echocardiogram examinations.                 Arrythmias:Atrial Fibrillation; Risk Factors:Dyslipidemia and                 Hypertension.  Sonographer:    Bernadene Person RDCS Referring Phys: [id number] [place]  1. Left ventricular ejection fraction, by estimation, is 60 to 65%. The left ventricle has normal function. The left ventricle has no regional wall motion abnormalities. Left ventricular diastolic parameters are consistent with Grade I diastolic dysfunction (impaired relaxation).  2. Right ventricular systolic function is moderately reduced. The right ventricular size is moderately enlarged.  3. The pericardial effusion is posterior to the left ventricle.  4. The mitral valve is abnormal. Mild mitral valve regurgitation. No evidence of mitral stenosis.  5. The aortic valve is tricuspid. There is mild calcification of the aortic valve. Aortic valve regurgitation is mild. Aortic valve sclerosis/calcification is present, without any evidence of aortic stenosis.  6. The inferior vena cava is normal in size with greater than 50% respiratory variability, suggesting right  atrial pressure of 3 mmHg. FINDINGS  Left Ventricle: Left ventricular ejection fraction, by estimation, is 60 to 65%. The left ventricle has normal function. The left ventricle has no regional wall motion  abnormalities. The left ventricular internal cavity size was normal in size. There is  no left ventricular hypertrophy. Left ventricular diastolic parameters are consistent with Grade I diastolic dysfunction (impaired relaxation). Right Ventricle: The right ventricular size is moderately enlarged. Right vetricular wall thickness was not assessed. Right ventricular systolic function is moderately reduced. Left Atrium: Left atrial size was normal in size. Right Atrium: Right atrial size was normal in size. Pericardium: Trivial pericardial effusion is present. The pericardial effusion is posterior to the left ventricle. Mitral Valve: The mitral valve is abnormal. There is mild thickening of the mitral valve leaflet(s). Mild mitral valve regurgitation. No evidence of mitral valve stenosis. Tricuspid Valve: The tricuspid valve is normal in structure. Tricuspid valve regurgitation is mild . No evidence of tricuspid stenosis. Aortic Valve: The aortic valve is tricuspid. There is mild calcification of the aortic valve. Aortic valve regurgitation is mild. Aortic regurgitation PHT measures 522 msec. Aortic valve sclerosis/calcification is present, without any evidence of aortic stenosis. Pulmonic Valve: The pulmonic valve was normal in structure. Pulmonic valve regurgitation is mild. No evidence of pulmonic stenosis. Aorta: The aortic root is normal in size and structure. Venous: The inferior vena cava is normal in size with greater than 50% respiratory variability, suggesting right atrial pressure of 3 mmHg. IAS/Shunts: No atrial level shunt detected by color flow Doppler.  LEFT VENTRICLE PLAX 2D LVIDd:         3.00 cm     Diastology LVIDs:         2.10 cm     LV e' medial:    4.24 cm/s LV PW:         0.90 cm     LV E/e' medial:  9.2 LV IVS:        1.10 cm     LV e' lateral:   7.51 cm/s LVOT diam:     2.10 cm     LV E/e' lateral: 5.2 LV SV:         61 LV SV Index:   41 LVOT Area:     3.46 cm  LV Volumes (MOD) LV vol d, MOD  A2C: 43.6 ml LV vol d, MOD A4C: 73.6 ml LV vol s, MOD A2C: 22.3 ml LV vol s, MOD A4C: 29.1 ml LV SV MOD A2C:     21.3 ml LV SV MOD A4C:     73.6 ml LV SV MOD BP:      30.1 ml RIGHT VENTRICLE RV S prime:     14.00 cm/s TAPSE (M-mode): 2.0 cm LEFT ATRIUM             Index        RIGHT ATRIUM           Index LA diam:        2.70 cm 1.83 cm/m   RA Area:     13.60 cm LA Vol (A2C):   31.6 ml 21.39 ml/m  RA Volume:   28.10 ml  19.02 ml/m LA Vol (A4C):   31.4 ml 21.26 ml/m LA Biplane Vol: 34.0 ml 23.02 ml/m  AORTIC VALVE             PULMONIC VALVE LVOT Vmax:   99.00 cm/s  PR End Diast Vel: 8.88 msec LVOT Vmean:  65.200 cm/s  LVOT VTI:    0.175 m AI PHT:      522 msec  AORTA Ao Root diam: 3.10 cm Ao Asc diam:  3.60 cm MITRAL VALVE               TRICUSPID VALVE MV Area (PHT): 3.93 cm    TR Peak grad:   26.8 mmHg MV Decel Time: 193 msec    TR Vmax:        259.00 cm/s MV E velocity: 38.80 cm/s MV A velocity: 86.30 cm/s  SHUNTS MV E/A ratio:  0.45        Systemic VTI:  0.18 m                            Systemic Diam: 2.10 cm Jenkins Rouge MD Electronically signed by Jenkins Rouge MD Signature Date/Time: 02/14/2021/4:25:43 PM    Final         Scheduled Meds:  apixaban  10 mg Oral BID   Followed by   Derrill Memo ON 02/22/2021] apixaban  5 mg Oral BID   Chlorhexidine Gluconate Cloth  6 each Topical Q0600   feeding supplement  237 mL Oral BID BM   mouth rinse  15 mL Mouth Rinse BID   memantine  14 mg Oral Daily   multivitamin with minerals  1 tablet Oral Daily   pantoprazole  40 mg Oral Daily   Continuous Infusions:  sodium chloride 10 mL/hr at 02/15/21 0800   cefTRIAXone (ROCEPHIN)  IV 1 g (02/15/21 1653)   sodium phosphate  Dextrose 5% IVPB 30 mmol (02/16/21 1100)     LOS: 4 days    Time spent: 35 minutes.     Elmarie Shiley, MD [facility]   If 7PM-7AM, please contact night-coverage [email or web address]  02/16/2021, 1:08 PM

## 2021-02-16 NOTE — Care Management Important Message (Signed)
Important Message  Patient Details  Name: [name] [name] MRN: [id number] Date of Birth: 1931-05-11   Medicare Important Message Given:  Yes     [name] 02/16/2021, 2:40 PM

## 2021-02-17 LAB — BASIC METABOLIC PANEL
Anion gap: 5 (ref 5–15)
BUN: 7 mg/dL — ABNORMAL LOW (ref 8–23)
CO2: 25 mmol/L (ref 22–32)
Calcium: 8.3 mg/dL — ABNORMAL LOW (ref 8.9–10.3)
Chloride: 103 mmol/L (ref 98–111)
Creatinine, Ser: 0.54 mg/dL (ref 0.44–1.00)
GFR, Estimated: >60 mL/min (ref >60)
Glucose, Bld: 92 mg/dL (ref 70–99)
Potassium: 3.4 mmol/L — ABNORMAL LOW (ref 3.5–5.1)
Sodium: 133 mmol/L — ABNORMAL LOW (ref 135–145)

## 2021-02-17 LAB — CULTURE, BLOOD (ROUTINE X 2)
Culture: NO GROWTH
Culture: NO GROWTH
Special Requests: ADEQUATE

## 2021-02-17 LAB — PHOSPHORUS: Phosphorus: 2.1 mg/dL — ABNORMAL LOW (ref 2.5–4.6)

## 2021-02-17 LAB — MAGNESIUM: Magnesium: 2 mg/dL (ref 1.7–2.4)

## 2021-02-17 MED ORDER — SODIUM CHLORIDE 0.9 % IV BOLUS
500.0000 mL | Freq: Once | INTRAVENOUS | Status: AC
  Administered 2021-02-17: 500 mL via INTRAVENOUS

## 2021-02-17 MED ORDER — POTASSIUM PHOSPHATES 15 MMOLE/5ML IV SOLN
30.0000 mmol | Freq: Once | INTRAVENOUS | Status: AC
  Administered 2021-02-17: 30 mmol via INTRAVENOUS
  Filled 2021-02-17: qty 10

## 2021-02-17 MED ORDER — SODIUM CHLORIDE 0.9 % IV SOLN: INTRAVENOUS | Status: DC

## 2021-02-17 MED ORDER — POTASSIUM CHLORIDE CRYS ER 20 MEQ PO TBCR
40.0000 meq | EXTENDED_RELEASE_TABLET | Freq: Once | ORAL | Status: AC
  Administered 2021-02-17: 40 meq via ORAL
  Filled 2021-02-17: qty 2

## 2021-02-17 NOTE — Progress Notes (Addendum)
PROGRESS NOTE    [name] [name]  EVO:[id number] DOB: [birth year] DOA: 02/12/2021 PCP: Denita Lung, MD   Brief Narrative: 87 year old past medical history significant for A. fib not on anticoagulation, GERD, hypertension, PVD, OSA, dementia who has had since 12/6 mental decline at home, poor oral intake.  Patient presented lethargic, febrile on arrival temperature 104, leukocytosis, CTA chest was obtained which was concerning for PE and right and left pulmonary artery, lobar arterial and multiple bilateral segmental and subsegmental PE.  Concern for RV strain.  She became hypotensive in the ED.  Received IV fluid and started on Levophed.  Started on broad-spectrum antibiotics.  CCM admitted patient.  Hospital Course:  12/10 Admit, large BL PE, +UA, Vanc cefepime, to IR for thrombectomy Underwent thrombectomy under MAC 12/11 U/S BLE 12/11 > +left femoral, popliteal, posterior tibial vein DVT  TTE 12/11 >> LV normal fxn, Grade 1 diastolic dysfunction, RV function moderately reduced, RV size moderately enlarged, pericardial effusion is posterior to the left ventricle, MV is abnormal with regurgitation , no stenosis, Aortic valve tricuspid with mild calcification and mild regurgitation , sclerosis/calcification is present, without any evidence of aortic  stenosis.  Inferior vena cava is normal size, suggesting RAP of 3 mm Hg.    Assessment & Plan:   Principal Problem:   Pulmonary embolism (HCC) Active Problems:   Pressure injury of skin   Protein-calorie malnutrition, severe  1-Acute metabolic encephalopathy in the setting of sepsis shock and UTI improving Dementia: Continue with Namenda Stable.   2-Shock multifactorial secondary to PE and sepsis secondary to UTI. -Patient was weaned off of Levophed. -Blood cultures: No growth to date.  -Urine culture, not obtained. -IV antibiotics, ceftriaxone. Completed antibiotics: 4 days antibiotics.  -BP low side today, plan to resume Fluids.    3-Submassive PE, lower extremity DVT: Status post thrombectomy by IR 12/11. She was initially treated with heparin drip, now transition to [employer].   A. fib, hypertension, PVD: Aspirin on hold Continue with statins.  Beta Blocker on hold due to hypotension on admission  Hypokalemia: Replete orally.  Mg at 2.0 Hypophosphatemia; Replete IV.   Anemia of critical illness: Hb stable.  Check b12. .   Pressure injury sacral stage II POA Buttock stage II POA Wound care  AKI: Cr peak to 1.3 Improved with support.   Malnutrition Nutrition consulted  GERD: Continue with PPI  Feet pain: Continue with Tylenol. Elevation of troponin:  Related to PE>    Pressure Injury 02/13/21 Sacrum Mid Stage 2 -  Partial thickness loss of dermis presenting as a shallow open injury with a red, pink wound bed without slough. pink (Active)  02/13/21 0200  Location: Sacrum  Location Orientation: Mid  Staging: Stage 2 -  Partial thickness loss of dermis presenting as a shallow open injury with a red, pink wound bed without slough.  Wound Description (Comments): pink  Present on Admission: Yes     Pressure Injury 02/13/21 Buttocks Right Stage 2 -  Partial thickness loss of dermis presenting as a shallow open injury with a red, pink wound bed without slough. pink (Active)  02/13/21 0200  Location: Buttocks  Location Orientation: Right  Staging: Stage 2 -  Partial thickness loss of dermis presenting as a shallow open injury with a red, pink wound bed without slough.  Wound Description (Comments): pink  Present on Admission: Yes     Nutrition Problem: Severe Malnutrition Etiology: chronic illness (dementia)    Signs/Symptoms: severe muscle depletion,  severe fat depletion, percent weight loss (10.8% weight loss in 3 months) Percent weight loss: 10.8 %    Interventions: Ensure Enlive (each supplement provides 350kcal and 20 grams of protein), MVI, Magic cup,  Liberalize Diet, Other (Comment) (feeding assist)  Estimated body mass index is 25.34 kg/m as calculated from the following:   Height as of 10/12/20: _0  (1.499 m).   Weight as of this encounter: 56.9 kg.   DVT prophylaxis: eliquis Code Status: Full code Family Communication: Daughter Updated 12/14. Disposition Plan:  Status is: Inpatient  Remains inpatient appropriate because: PE, shock.   Disposition: discharge in 1 or 2 days       Consultants:  CCM   Procedures:  Thrombectomy   Antimicrobials:    Subjective: Alert, denies dyspnea.   Objective: Vitals:   02/16/21 1627 02/16/21 2013 02/17/21 0602 02/17/21 0852  BP: 91/66 (!) 97/51 (!) 86/57 (!) 95/54  Pulse: 76 99 78 73  Resp: _1 Temp: 97.8 F (36.6 C) 98.9 F (37.2 C) 98.6 F (37 C) 98.9 F (37.2 C)  TempSrc: Oral Oral Oral Oral  SpO2: 99% 99% 100% 100%  Weight:        Intake/Output Summary (Last 24 hours) at 02/17/2021 1229 Last data filed at 02/17/2021 1610 Gross per 24 hour  Intake 400 ml  Output 501 ml  Net -101 ml    Filed Weights   02/13/21 0407 02/15/21 0500 02/16/21 0415  Weight: 53.8 kg 53 kg 56.9 kg    Examination:  General exam: NAD Respiratory system: CTA Cardiovascular system: S 1, S 2 RRR Gastrointestinal system: BS present, soft, nt Central nervous system: Alert, confuse Extremities: No edema    Data Reviewed: I have personally reviewed following labs and imaging studies  CBC: Recent Labs  Lab 02/12/21 1556 02/13/21 0329 02/14/21 0500 02/15/21 0505 02/16/21 0515  WBC 12.4* 12.8* 12.5* 11.4* 9.7  NEUTROABS 9.6*  --   --   --  7.4  HGB 14.6 11.0* 9.5* 9.0* 9.2*  HCT 44.7 33.2* 28.0* 26.6* 27.1*  MCV 101.4* 98.5 96.2 97.1 97.5  PLT 180 158 143* 135* 960    Basic Metabolic Panel: Recent Labs  Lab 02/13/21 0329 02/14/21 0922 02/15/21 0505 02/16/21 0515 02/17/21 0227  NA 135 136 132* 133* 133*  K 3.2* 3.3* 3.0* 3.1* 3.4*  CL 105 106 104 104  103  CO2 21* 21* 21* 24 25  GLUCOSE 173* 102* 104* 94 92  BUN 26* _2 7*  CREATININE 0.95 0.71 0.62 0.57 0.54  CALCIUM 8.1* 8.5* 8.4* 8.4* 8.3*  MG 1.7 1.8 2.1 1.8 2.0  PHOS 2.9 1.5* 1.9* 2.0* 2.1*    GFR: Estimated Creatinine Clearance: 38.1 mL/min (by C-G formula based on SCr of 0.54 mg/dL). Liver Function Tests: Recent Labs  Lab 02/12/21 1556  AST 29  ALT 14  ALKPHOS 58  BILITOT 1.7*  PROT 7.4  ALBUMIN 3.1*    Recent Labs  Lab 02/12/21 1556  LIPASE 22    No results for input(s): AMMONIA in the last 168 hours. Coagulation Profile: Recent Labs  Lab 02/12/21 1556  INR 1.3*    Cardiac Enzymes: No results for input(s): CKTOTAL, CKMB, CKMBINDEX, TROPONINI in the last 168 hours. BNP (last 3 results) No results for input(s): PROBNP in the last 8760 hours. HbA1C: No results for input(s): HGBA1C in the last 72 hours. CBG: Recent Labs  Lab 02/13/21 0202  GLUCAP 161*    Lipid Profile: No  results for input(s): CHOL, HDL, LDLCALC, TRIG, CHOLHDL, LDLDIRECT in the last 72 hours. Thyroid Function Tests: No results for input(s): TSH, T4TOTAL, FREET4, T3FREE, THYROIDAB in the last 72 hours. Anemia Panel: No results for input(s): VITAMINB12, FOLATE, FERRITIN, TIBC, IRON, RETICCTPCT in the last 72 hours. Sepsis Labs: Recent Labs  Lab 02/12/21 1556 02/12/21 2124 02/13/21 1335 02/13/21 1723 02/14/21 0500 02/15/21 0505  PROCALCITON  --   --  0.14  --  0.11 <0.10  LATICACIDVEN 2.0* 4.4* 1.4 1.3  --   --      Recent Results (from the past 240 hour(s))  Culture, blood (Routine x 2)     Status: None   Collection Time: 02/12/21  3:56 PM   Specimen: BLOOD RIGHT FOREARM  Result Value Ref Range Status   Specimen Description BLOOD RIGHT FOREARM  Final   Special Requests   Final    BOTTLES DRAWN AEROBIC AND ANAEROBIC Blood Culture results may not be optimal due to an inadequate volume of blood received in culture bottles   Culture   Final    NO GROWTH 5  DAYS Performed at [facility], [place] [address], [place], [place] [address]    Report Status 02/17/2021 FINAL  Final  Resp Panel by RT-PCR (Flu A&B, Covid)     Status: None   Collection Time: 02/12/21  3:56 PM   Specimen: Nasopharyngeal(NP) swabs in vial transport medium  Result Value Ref Range Status   SARS Coronavirus 2 by RT PCR NEGATIVE NEGATIVE Final    Comment: (NOTE) SARS-CoV-2 target nucleic acids are NOT DETECTED.  The SARS-CoV-2 RNA is generally detectable in upper respiratory specimens during the acute phase of infection. The lowest concentration of SARS-CoV-2 viral copies this assay can detect is 138 copies/mL. A negative result does not preclude SARS-Cov-2 infection and should not be used as the sole basis for treatment or other patient management decisions. A negative result may occur with  improper specimen collection/handling, submission of specimen other than nasopharyngeal swab, presence of viral mutation(s) within the areas targeted by this assay, and inadequate number of viral copies(<138 copies/mL). A negative result must be combined with clinical observations, patient history, and epidemiological information. The expected result is Negative.  Fact Sheet for Patients:  EntrepreneurPulse.com.au  Fact Sheet for Healthcare Providers:  IncredibleEmployment.be  This test is no t yet approved or cleared by the [place] FDA and  has been authorized for detection and/or diagnosis of SARS-CoV-2 by FDA under an Emergency Use Authorization (EUA). This EUA will remain  in effect (meaning this test can be used) for the duration of the COVID-19 declaration under Section 564(b)(1) of the Act, 21 U.S.C.section 360bbb-3(b)(1), unless the authorization is terminated  or revoked sooner.       Influenza A by PCR NEGATIVE NEGATIVE Final   Influenza B by PCR NEGATIVE NEGATIVE Final    Comment: (NOTE) The Xpert Xpress  SARS-CoV-2/FLU/RSV plus assay is intended as an aid in the diagnosis of influenza from Nasopharyngeal swab specimens and should not be used as a sole basis for treatment. Nasal washings and aspirates are unacceptable for Xpert Xpress SARS-CoV-2/FLU/RSV testing.  Fact Sheet for Patients: EntrepreneurPulse.com.au  Fact Sheet for Healthcare Providers: IncredibleEmployment.be  This test is not yet approved or cleared by the [place] FDA and has been authorized for detection and/or diagnosis of SARS-CoV-2 by FDA under an Emergency Use Authorization (EUA). This EUA will remain in effect (meaning this test can be used) for the duration of  the COVID-19 declaration under Section 564(b)(1) of the Act, 21 U.S.C. section 360bbb-3(b)(1), unless the authorization is terminated or revoked.  Performed at [facility], [place] [address], [place], [place] [address]   Culture, blood (Routine x 2)     Status: None   Collection Time: 02/12/21  5:25 PM   Specimen: Left Antecubital; Blood  Result Value Ref Range Status   Specimen Description LEFT ANTECUBITAL  Final   Special Requests   Final    BOTTLES DRAWN AEROBIC AND ANAEROBIC Blood Culture adequate volume   Culture   Final    NO GROWTH 5 DAYS Performed at [facility], [place] [address] [place], [place] [address]    Report Status 02/17/2021 FINAL  Final  MRSA Next Gen by PCR, Nasal     Status: None   Collection Time: 02/13/21  7:12 AM   Specimen: Nasal Mucosa; Nasal Swab  Result Value Ref Range Status   MRSA by PCR Next Gen NOT DETECTED NOT DETECTED Final    Comment: (NOTE) The GeneXpert MRSA Assay (FDA approved for NASAL specimens only), is one component of a comprehensive MRSA colonization surveillance program. It is not intended to diagnose MRSA infection nor to guide or monitor treatment for MRSA infections. Test performance is not FDA approved in patients less than 28 years old. Performed  at [facility], [place] [address] [place], [place] [address]           Radiology Studies: No results found.      Scheduled Meds:  apixaban  10 mg Oral BID   Followed by   Derrill Memo ON 02/22/2021] apixaban  5 mg Oral BID   Chlorhexidine Gluconate Cloth  6 each Topical Q0600   feeding supplement  237 mL Oral BID BM   mouth rinse  15 mL Mouth Rinse BID   memantine  14 mg Oral Daily   multivitamin with minerals  1 tablet Oral Daily   pantoprazole  40 mg Oral Daily   Continuous Infusions:  sodium chloride 10 mL/hr at 02/15/21 0800   sodium chloride 75 mL/hr at 02/17/21 1051   potassium PHOSPHATE IVPB (in mmol) 30 mmol (02/17/21 1024)     LOS: 5 days    Time spent: 35 minutes.     Elmarie Shiley, MD [facility]   If 7PM-7AM, please contact night-coverage [email or web address]  02/17/2021, 12:29 PM

## 2021-02-17 NOTE — Evaluation (Signed)
Occupational Therapy Evaluation Patient Details Name: [name] [name] MRN: [id number] DOB: 1931/11/07 Today's Date: 02/17/2021   History of Present Illness 87 y/o female admitted with AMS.  PMH positive for a-fib, GERD, HTN, PVD, HLD, OSA and dementia. 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   Clinical Impression   PTA, pt was living with her daughter and was performing ADLs and walking with rollator until early November; pt would go to "daycare" three days a week. In early November, pt's daughter hospitalized  and pt stayed with her "daycare" person - upon daughter's dc from hospital, pt was not walking or participating in ADLs. Pt currently requiring Max A-Total A for ADLs and bed mobility. Discussed dc options with daughter who would like patient to return home but agreeable to look into SNFs as pt has decreased balance, strength, cognition, and activity tolerance. Will continue to follow acutely as admitted.     Recommendations for follow up therapy are one component of a multi-disciplinary discharge planning process, led by the attending physician.  Recommendations may be updated based on patient status, additional functional criteria and insurance authorization.   Follow Up Recommendations  Skilled nursing-short term rehab (<3 hours/day)    Assistance Recommended at Discharge Frequent or constant Supervision/Assistance  Functional Status Assessment  Patient has had a recent decline in their functional status and/or demonstrates limited ability to make significant improvements in function in a reasonable and predictable amount of time  Equipment Recommendations  Other (comment);BSC/3in1;Wheelchair (measurements OT);Wheelchair cushion (measurements OT) (hoyer lift)    Recommendations for Other Services PT consult     Precautions / Restrictions Precautions Precautions: Fall      Mobility  Bed Mobility Overal bed mobility: Needs Assistance Bed Mobility: Supine to Sit;Rolling;Sit to Supine Rolling: Max assist;Total assist (Max A for rolling to R and Total A for rolling L.)   Supine to sit: Max assist;+2 for physical assistance Sit to supine: Total assist   General bed mobility comments: Pt requiring Max A for rolling R and Total A for rolling L. Max A +2 for bringing BLEs over EOB and then elevating trunk. Total A to return to bed.    Transfers                   General transfer comment: Defer due to safety. Pt also reporitng bil feet pain      Balance Overall balance assessment: Needs assistance Sitting-balance support: No upper extremity supported;Feet supported Sitting balance-Leahy Scale: Poor Sitting balance - Comments: mod to max A for sitting balance EOB                                   ADL either performed or assessed with clinical judgement   ADL Overall ADL's : Needs assistance/impaired                                       General ADL Comments: Pt requiring Max-Total A for ADLs and bed mobility. bowel incontinence at bed level. Total A for rolling in bed and completing peri care.     Vision         Perception     Praxis      Pertinent Vitals/Pain Pain Assessment: Faces Faces  Pain Scale: Hurts even more Pain Location: Bilateral feet - when sitting "my feet hurt so bad" Pain Descriptors / Indicators: Moaning;Discomfort;Guarding Pain Intervention(s): Monitored during session;Limited activity within patient's tolerance;Repositioned     Hand Dominance     Extremity/Trunk Assessment Upper Extremity Assessment Upper Extremity Assessment: Generalized weakness   Lower Extremity Assessment Lower Extremity Assessment: RLE deficits/detail;LLE deficits/detail RLE Deficits / Details: keeps legs flexed under her in the bed, tight hamstrings and heel cords with less than antigravity strength; can extend mostly in  bed with some c/o pain LLE Deficits / Details: keeps legs flexed under her in the bed, tight hamstrings and heel cords with less than antigravity strength; can extend mostly in bed with some c/o pain   Cervical / Trunk Assessment Cervical / Trunk Assessment: Kyphotic;Other exceptions Cervical / Trunk Exceptions: Poor trunk control   Communication Communication Communication: Expressive difficulties;Receptive difficulties   Cognition Arousal/Alertness: Awake/alert Behavior During Therapy: WFL for tasks assessed/performed Overall Cognitive Status: No family/caregiver present to determine baseline cognitive functioning                                       General Comments       Exercises     Shoulder Instructions      Home Living Family/patient expects to be discharged to:: Private residence Living Arrangements: Children Available Help at Discharge: Family   Home Access: Stairs to enter Technical brewer of Steps: 12   Home Layout: Able to live on main level with bedroom/bathroom               Home Equipment: Rollator (4 wheels)   Additional Comments: daughter went to hospital for a CVA nov 3rd and recently dc on dec 10.      Prior Functioning/Environment Prior Level of Function : Needs assist  Cognitive Assist : ADLs (cognitive);Mobility (cognitive) Mobility (Cognitive): Set up cues ADLs (Cognitive): Intermittent cues       Mobility Comments: Rollator          OT Problem List: Decreased strength;Decreased range of motion;Decreased activity tolerance;Impaired balance (sitting and/or standing);Decreased knowledge of use of DME or AE;Decreased cognition;Decreased safety awareness      OT Treatment/Interventions: Therapeutic exercise;Self-care/ADL training;Neuromuscular education;DME and/or AE instruction;Therapeutic activities;Patient/family education    OT Goals(Current goals can be found in the care plan section) Acute Rehab OT  Goals Patient Stated Goal: Daughter would like to be able to bring her mother home again OT Goal Formulation: With family Time For Goal Achievement: 03/03/21 Potential to Achieve Goals: Good  OT Frequency: Min 2X/week   Barriers to D/C:            Co-evaluation              AM-PAC OT "6 Clicks" Daily Activity     Outcome Measure Help from another person eating meals?: Total Help from another person taking care of personal grooming?: Total Help from another person toileting, which includes using toliet, bedpan, or urinal?: Total Help from another person bathing (including washing, rinsing, drying)?: Total Help from another person to put on and taking off regular upper body clothing?: Total Help from another person to put on and taking off regular lower body clothing?: Total 6 Click Score: 6   End of Session Nurse Communication: Mobility status  Activity Tolerance: Patient tolerated treatment well Patient left: in bed;with call bell/phone within reach;with bed alarm set  OT  Visit Diagnosis: Unsteadiness on feet (R26.81);Other abnormalities of gait and mobility (R26.89);Muscle weakness (generalized) (M62.81);Pain                Time: 4996-9249 OT Time Calculation (min): 32 min Charges:  OT General Charges $OT Visit: 1 Visit OT Evaluation $OT Eval Moderate Complexity: 1 Mod OT Treatments $Self Care/Home Management : 8-22 mins  Jet Traynham MSOT, OTR/L Acute Rehab Pager: [phone] Office: [place] 02/17/2021, 1:59 PM

## 2021-02-17 NOTE — Care Management (Signed)
From PT note , patient immobile , however recommending HHPT . In progression rounds reported patient confused. No family at bedside. NCM called patient's daughter [name] [name] [phone] and left voicemail. Awaiting call back.

## 2021-02-18 LAB — CBC
HCT: 24.6 % — ABNORMAL LOW (ref 36.0–46.0)
Hemoglobin: 8.2 g/dL — ABNORMAL LOW (ref 12.0–15.0)
MCH: 33.1 pg (ref 26.0–34.0)
MCHC: 33.3 g/dL (ref 30.0–36.0)
MCV: 99.2 fL (ref 80.0–100.0)
Platelets: 214 10*3/uL (ref 150–400)
RBC: 2.48 MIL/uL — ABNORMAL LOW (ref 3.87–5.11)
RDW: 13.2 % (ref 11.5–15.5)
WBC: 8 10*3/uL (ref 4.0–10.5)
nRBC: 0 % (ref 0.0–0.2)

## 2021-02-18 LAB — BASIC METABOLIC PANEL
Anion gap: 7 (ref 5–15)
BUN: 8 mg/dL (ref 8–23)
CO2: 21 mmol/L — ABNORMAL LOW (ref 22–32)
Calcium: 8.4 mg/dL — ABNORMAL LOW (ref 8.9–10.3)
Chloride: 104 mmol/L (ref 98–111)
Creatinine, Ser: 0.52 mg/dL (ref 0.44–1.00)
GFR, Estimated: >60 mL/min (ref >60)
Glucose, Bld: 89 mg/dL (ref 70–99)
Potassium: 4.2 mmol/L (ref 3.5–5.1)
Sodium: 132 mmol/L — ABNORMAL LOW (ref 135–145)

## 2021-02-18 LAB — PHOSPHORUS: Phosphorus: 2 mg/dL — ABNORMAL LOW (ref 2.5–4.6)

## 2021-02-18 LAB — MAGNESIUM: Magnesium: 1.8 mg/dL (ref 1.7–2.4)

## 2021-02-18 LAB — VITAMIN B12: Vitamin B-12: 1710 pg/mL — ABNORMAL HIGH (ref 180–914)

## 2021-02-18 MED ORDER — FERROUS SULFATE 325 (65 FE) MG PO TABS
325.0000 mg | ORAL_TABLET | Freq: Every day | ORAL | Status: DC
  Administered 2021-02-18 – 2021-02-23 (×6): 325 mg via ORAL
  Filled 2021-02-18 (×6): qty 1

## 2021-02-18 MED ORDER — SODIUM PHOSPHATES 45 MMOLE/15ML IV SOLN
30.0000 mmol | Freq: Once | INTRAVENOUS | Status: AC
  Administered 2021-02-18: 30 mmol via INTRAVENOUS
  Filled 2021-02-18: qty 10

## 2021-02-18 MED ORDER — K PHOS MONO-SOD PHOS DI & MONO 155-852-130 MG PO TABS
500.0000 mg | ORAL_TABLET | Freq: Two times a day (BID) | ORAL | Status: DC
  Administered 2021-02-18 – 2021-02-23 (×11): 500 mg via ORAL
  Filled 2021-02-18 (×13): qty 2

## 2021-02-18 MED ORDER — MAGNESIUM SULFATE 2 GM/50ML IV SOLN
2.0000 g | Freq: Once | INTRAVENOUS | Status: AC
  Administered 2021-02-18: 2 g via INTRAVENOUS
  Filled 2021-02-18: qty 50

## 2021-02-18 NOTE — Progress Notes (Signed)
Physical Therapy Treatment Patient Details Name: [name] [name] MRN: [id number] DOB: 1931-07-04 Today's Date: 02/18/2021   History of Present Illness 87 y/o female admitted with AMS.  PMH positive for a-fib, GERD, HTN, PVD, HLD, OSA and dementia. 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    PT Comments    Pt received in bed, pt oriented to self only but very pleasant throughout session. Pt currently requiring total assist for bed-level mobility, PT focusing session on mechanics of bed mobility and sitting balance at EOB. Pt with LE hip flexion and knee flexion contractures, benefits from AAROM to promote full ROM. After talking with pt's medical team and understanding pt/family wishes, PT updating recommendation to ST-SNF to maximize pt functional recovery and promote return to baseline level of mobility, which per family is walking with a rollator. PT to continue to follow.     Recommendations for follow up therapy are one component of a multi-disciplinary discharge planning process, led by the attending physician.  Recommendations may be updated based on patient status, additional functional criteria and insurance authorization.  Follow Up Recommendations  Skilled nursing-short term rehab (<3 hours/day)     Assistance Recommended at Discharge Intermittent Supervision/Assistance  Equipment Recommendations  Other (comment) (defer to next venue)    Recommendations for Other Services       Precautions / Restrictions Precautions Precautions: Fall Restrictions Weight Bearing Restrictions: No     Mobility  Bed Mobility Overal bed mobility: Needs Assistance Bed Mobility: Supine to Sit;Sit to Supine Rolling:  (Max A for rolling to R and Total A for rolling L.)   Supine to sit: Total assist Sit to supine: Total assist   General bed mobility comments: total assist all aspects, pt not assisting  when cued for mobility.    Transfers Overall transfer level: Needs assistance   Transfers: Sit to/from Stand Sit to Stand: Total assist           General transfer comment: Attempted sit<>stand, reached partial rise with total assist and pt requests to stop, pt returned to supine.    Ambulation/Gait                   Stairs             Wheelchair Mobility    Modified Rankin (Stroke Patients Only)       Balance Overall balance assessment: Needs assistance Sitting-balance support: No upper extremity supported;Feet supported Sitting balance-Leahy Scale: Poor Sitting balance - Comments: reliant on at least min PT truncal and pelvic assist to maintain upright sitting, EOB sitting x10 minutes Postural control: Posterior lean   Standing balance-Leahy Scale: Zero                              Cognition Arousal/Alertness: Awake/alert Behavior During Therapy: WFL for tasks assessed/performed Overall Cognitive Status: History of cognitive impairments - at baseline                                 General Comments: history of dementia. Pt states she was at her house last night and we are across the street from the hospital, telling PT to get some vinegar to "cook the meat". Very pleasant        Exercises General Exercises - Lower Extremity  Heel Slides: AAROM;Both;10 reps;Supine Hip ABduction/ADduction: AAROM;Right;5 reps;Supine (5 second hold at comfortable end range)    General Comments General comments (skin integrity, edema, etc.): knee flexion contractures lacking ~10 degrees bilat, high risk for further skin breakdown (stage II sacral ulcer). Pt's default position is windswept to R, PT placing pillow between LEs and under buttocks to prevent skin breakdown      Pertinent Vitals/Pain Pain Assessment: Faces Faces Pain Scale: Hurts little more Pain Location: posterior LEs, during knee extension Pain Descriptors / Indicators:  Moaning;Discomfort;Guarding Pain Intervention(s): Limited activity within patient's tolerance;Monitored during session;Repositioned    Home Living                          Prior Function            PT Goals (current goals can now be found in the care plan section) Acute Rehab PT Goals Patient Stated Goal: none stated PT Goal Formulation: Patient unable to participate in goal setting Time For Goal Achievement: 03/02/21 Potential to Achieve Goals: Fair Progress towards PT goals: Not progressing toward goals - comment (total assist)    Frequency    Min 2X/week      PT Plan Discharge plan needs to be updated;Frequency needs to be updated    Co-evaluation              AM-PAC PT "6 Clicks" Mobility   Outcome Measure  Help needed turning from your back to your side while in a flat bed without using bedrails?: Total Help needed moving from lying on your back to sitting on the side of a flat bed without using bedrails?: Total Help needed moving to and from a bed to a chair (including a wheelchair)?: Total Help needed standing up from a chair using your arms (e.g., wheelchair or bedside chair)?: Total Help needed to walk in hospital room?: Total Help needed climbing 3-5 steps with a railing? : Total 6 Click Score: 6    End of Session   Activity Tolerance: Patient limited by pain Patient left: in bed;with bed alarm set;with call bell/phone within reach Nurse Communication: Mobility status PT Visit Diagnosis: Muscle weakness (generalized) (M62.81);Other abnormalities of gait and mobility (R26.89)     Time: 1006-1030 PT Time Calculation (min) (ACUTE ONLY): 24 min  Charges:  $Therapeutic Activity: 8-22 mins $Neuromuscular Re-education: 8-22 mins                     Stacie Glaze, PT DPT Acute Rehabilitation Services Pager [phone]  Office [phone]    Roxine Caddy E Ruffin Pyo 02/18/2021, 12:25 PM

## 2021-02-18 NOTE — Care Management (Signed)
°  °  Durable Medical Equipment  (From admission, onward)           Start     Ordered   02/18/21 1027  For home use only DME standard manual wheelchair with seat cushion  Once       Comments: Patient suffers from Acute metabolic encephalopathy in the setting of sepsis shock and UTI  which impairs their ability to perform daily activities like ambulating  in the home.  A cane  will not resolve issue with performing activities of daily living. A wheelchair will allow patient to safely perform daily activities. Patient can safely propel the wheelchair in the home or has a caregiver who can provide assistance. Length of need lifetime . Accessories: elevating leg rests (ELRs), wheel locks, extensions and anti-tippers.   Seat and back cushions   02/18/21 1027   02/18/21 1026  For home use only DME Hospital bed  Once       Comments: Call daughter Irena Reichmann [phone] for delivery thanks  Question Answer Comment  Length of Need Lifetime   Patient has (list medical condition): Acute metabolic encephalopathy in the setting of sepsis shock and UTI   The above medical condition requires: Patient requires the ability to reposition frequently   Head must be elevated greater than: 45 degrees   Bed type Semi-electric   Hoyer Lift Yes   Support Surface: Gel Overlay      02/18/21 1027

## 2021-02-18 NOTE — TOC Progression Note (Signed)
Transition of Care [facility]) - Progression Note    Patient Details  Name: [name] [name] MRN: [id number] Date of Birth: [birth year]  Transition of Care [facility]) CM/SW [place], [place] Phone Number: 02/18/2021, 3:26 PM  Clinical Narrative:     Fl2 completed and bed requests sent this AM. Pt currently has no bed offers.        Expected Discharge Plan and Services                                                 Social Determinants of Health (SDOH) Interventions    Readmission Risk Interventions No flowsheet data found.

## 2021-02-18 NOTE — Progress Notes (Signed)
PROGRESS NOTE    [name] [name]  UVO:[id number] DOB: 1932-01-22 DOA: 02/12/2021 PCP: Denita Lung, MD   Brief Narrative: 87 year old past medical history significant for A. fib not on anticoagulation, GERD, hypertension, PVD, OSA, dementia who has had since 12/6 mental decline at home, poor oral intake.  Patient presented lethargic, febrile on arrival temperature 104, leukocytosis, CTA chest was obtained which was concerning for PE and right and left pulmonary artery, lobar arterial and multiple bilateral segmental and subsegmental PE.  Concern for RV strain.  She became hypotensive in the ED.  Received IV fluid and started on Levophed.  Started on broad-spectrum antibiotics.  CCM admitted patient.  Hospital Course:  12/10 Admit, large BL PE, +UA, Vanc cefepime, to IR for thrombectomy Underwent thrombectomy under MAC 12/11 U/S BLE 12/11 > +left femoral, popliteal, posterior tibial vein DVT  TTE 12/11 >> LV normal fxn, Grade 1 diastolic dysfunction, RV function moderately reduced, RV size moderately enlarged, pericardial effusion is posterior to the left ventricle, MV is abnormal with regurgitation , no stenosis, Aortic valve tricuspid with mild calcification and mild regurgitation , sclerosis/calcification is present, without any evidence of aortic  stenosis.  Inferior vena cava is normal size, suggesting RAP of 3 mm Hg.    Assessment & Plan:   Principal Problem:   Pulmonary embolism (HCC) Active Problems:   Pressure injury of skin   Protein-calorie malnutrition, severe  1-Acute metabolic encephalopathy in the setting of sepsis shock and UTI improving Dementia: Continue with Namenda Stable.   2-Shock multifactorial secondary to PE and sepsis secondary to UTI. -Patient was weaned off of Levophed. -Blood cultures: No growth to date.  -Urine culture, not obtained. -IV antibiotics, ceftriaxone. Completed antibiotics: 4 days antibiotics.  -BP low side 12/15, improved with IV fluids.    3-Submassive PE, lower extremity DVT: Status post thrombectomy by IR 12/11. She was initially treated with heparin drip, now transition to [employer].  Stable.   A. fib, hypertension, PVD: Aspirin on hold Continue with statins.  Beta Blocker on hold due to hypotension on admission  Hypokalemia: Hypokalemia replaced.  Mg at 1.8 Hypophosphatemia; Replete IV.   Anemia of critical illness: Hb stable.  B 12 elevated.   Pressure injury sacral stage II POA Buttock stage II POA Wound care  AKI: Cr peak to 1.3 Improved with support.   Malnutrition Nutrition consulted  GERD: Continue with PPI  Feet pain: Continue with Tylenol. Elevation of troponin:  Related to PE>    Pressure Injury 02/13/21 Sacrum Mid Stage 2 -  Partial thickness loss of dermis presenting as a shallow open injury with a red, pink wound bed without slough. pink (Active)  02/13/21 0200  Location: Sacrum  Location Orientation: Mid  Staging: Stage 2 -  Partial thickness loss of dermis presenting as a shallow open injury with a red, pink wound bed without slough.  Wound Description (Comments): pink  Present on Admission: Yes     Pressure Injury 02/13/21 Buttocks Right Stage 2 -  Partial thickness loss of dermis presenting as a shallow open injury with a red, pink wound bed without slough. pink (Active)  02/13/21 0200  Location: Buttocks  Location Orientation: Right  Staging: Stage 2 -  Partial thickness loss of dermis presenting as a shallow open injury with a red, pink wound bed without slough.  Wound Description (Comments): pink  Present on Admission: Yes     Nutrition Problem: Severe Malnutrition Etiology: chronic illness (dementia)    Signs/Symptoms: severe  muscle depletion, severe fat depletion, percent weight loss (10.8% weight loss in 3 months) Percent weight loss: 10.8 %    Interventions: Ensure Enlive (each supplement provides 350kcal and 20 grams of protein), MVI,  Magic cup, Liberalize Diet, Other (Comment) (feeding assist)  Estimated body mass index is 25.34 kg/m as calculated from the following:   Height as of 10/12/20: _0  (1.499 m).   Weight as of this encounter: 56.9 kg.   DVT prophylaxis: eliquis Code Status: Full code Family Communication: Daughter Updated 12/14. Disposition Plan:  Status is: Inpatient  Remains inpatient appropriate because: PE, shock.   Disposition: discharge when bed available.       Consultants:  CCM   Procedures:  Thrombectomy   Antimicrobials:    Subjective: She is alert, denies dyspnea. Denies pain.   Objective: Vitals:   02/17/21 1442 02/17/21 1956 02/17/21 2112 02/18/21 0922  BP: 122/82 120/78 103/70 100/74  Pulse: 90 83 88 84  Resp: _1 Temp: 98.7 F (37.1 C) 99.1 F (37.3 C) 99.1 F (37.3 C) 98.5 F (36.9 C)  TempSrc: Oral Oral Oral Oral  SpO2: 100% 100% 97% 100%  Weight:        Intake/Output Summary (Last 24 hours) at 02/18/2021 1312 Last data filed at 02/18/2021 1030 Gross per 24 hour  Intake 2383.87 ml  Output 300 ml  Net 2083.87 ml    Filed Weights   02/13/21 0407 02/15/21 0500 02/16/21 0415  Weight: 53.8 kg 53 kg 56.9 kg    Examination:  General exam: NAD Respiratory system: CTA Cardiovascular system: S 1, S 2 RRR Gastrointestinal system: BS present, soft, nt Central nervous system: alert Extremities: no edema    Data Reviewed: I have personally reviewed following labs and imaging studies  CBC: Recent Labs  Lab 02/12/21 1556 02/13/21 0329 02/14/21 0500 02/15/21 0505 02/16/21 0515 02/18/21 0800  WBC 12.4* 12.8* 12.5* 11.4* 9.7 8.0  NEUTROABS 9.6*  --   --   --  7.4  --   HGB 14.6 11.0* 9.5* 9.0* 9.2* 8.2*  HCT 44.7 33.2* 28.0* 26.6* 27.1* 24.6*  MCV 101.4* 98.5 96.2 97.1 97.5 99.2  PLT 180 158 143* 135* 152 130    Basic Metabolic Panel: Recent Labs  Lab 02/14/21 0922 02/15/21 0505 02/16/21 0515 02/17/21 0227 02/18/21 0152  NA 136  132* 133* 133* 132*  K 3.3* 3.0* 3.1* 3.4* 4.2  CL 106 104 104 103 104  CO2 21* 21* 24 25 21*  GLUCOSE 102* 104* 94 92 89  BUN _2 7* 8  CREATININE 0.71 0.62 0.57 0.54 0.52  CALCIUM 8.5* 8.4* 8.4* 8.3* 8.4*  MG 1.8 2.1 1.8 2.0 1.8  PHOS 1.5* 1.9* 2.0* 2.1* 2.0*    GFR: Estimated Creatinine Clearance: 38.1 mL/min (by C-G formula based on SCr of 0.52 mg/dL). Liver Function Tests: Recent Labs  Lab 02/12/21 1556  AST 29  ALT 14  ALKPHOS 58  BILITOT 1.7*  PROT 7.4  ALBUMIN 3.1*    Recent Labs  Lab 02/12/21 1556  LIPASE 22    No results for input(s): AMMONIA in the last 168 hours. Coagulation Profile: Recent Labs  Lab 02/12/21 1556  INR 1.3*    Cardiac Enzymes: No results for input(s): CKTOTAL, CKMB, CKMBINDEX, TROPONINI in the last 168 hours. BNP (last 3 results) No results for input(s): PROBNP in the last 8760 hours. HbA1C: No results for input(s): HGBA1C in the last 72 hours. CBG: Recent Labs  Lab 02/13/21  0202  GLUCAP 161*    Lipid Profile: No results for input(s): CHOL, HDL, LDLCALC, TRIG, CHOLHDL, LDLDIRECT in the last 72 hours. Thyroid Function Tests: No results for input(s): TSH, T4TOTAL, FREET4, T3FREE, THYROIDAB in the last 72 hours. Anemia Panel: Recent Labs    02/18/21 0152  VITAMINB12 1,710*   Sepsis Labs: Recent Labs  Lab 02/12/21 1556 02/12/21 2124 02/13/21 1335 02/13/21 1723 02/14/21 0500 02/15/21 0505  PROCALCITON  --   --  0.14  --  0.11 <0.10  LATICACIDVEN 2.0* 4.4* 1.4 1.3  --   --      Recent Results (from the past 240 hour(s))  Culture, blood (Routine x 2)     Status: None   Collection Time: 02/12/21  3:56 PM   Specimen: BLOOD RIGHT FOREARM  Result Value Ref Range Status   Specimen Description BLOOD RIGHT FOREARM  Final   Special Requests   Final    BOTTLES DRAWN AEROBIC AND ANAEROBIC Blood Culture results may not be optimal due to an inadequate volume of blood received in culture bottles   Culture   Final    NO  GROWTH 5 DAYS Performed at [facility], [place] [address] [place], [place] [address]    Report Status 02/17/2021 FINAL  Final  Resp Panel by RT-PCR (Flu A&B, Covid)     Status: None   Collection Time: 02/12/21  3:56 PM   Specimen: Nasopharyngeal(NP) swabs in vial transport medium  Result Value Ref Range Status   SARS Coronavirus 2 by RT PCR NEGATIVE NEGATIVE Final    Comment: (NOTE) SARS-CoV-2 target nucleic acids are NOT DETECTED.  The SARS-CoV-2 RNA is generally detectable in upper respiratory specimens during the acute phase of infection. The lowest concentration of SARS-CoV-2 viral copies this assay can detect is 138 copies/mL. A negative result does not preclude SARS-Cov-2 infection and should not be used as the sole basis for treatment or other patient management decisions. A negative result may occur with  improper specimen collection/handling, submission of specimen other than nasopharyngeal swab, presence of viral mutation(s) within the areas targeted by this assay, and inadequate number of viral copies(<138 copies/mL). A negative result must be combined with clinical observations, patient history, and epidemiological information. The expected result is Negative.  Fact Sheet for Patients:  EntrepreneurPulse.com.au  Fact Sheet for Healthcare Providers:  IncredibleEmployment.be  This test is no t yet approved or cleared by the [place] FDA and  has been authorized for detection and/or diagnosis of SARS-CoV-2 by FDA under an Emergency Use Authorization (EUA). This EUA will remain  in effect (meaning this test can be used) for the duration of the COVID-19 declaration under Section 564(b)(1) of the Act, 21 U.S.C.section 360bbb-3(b)(1), unless the authorization is terminated  or revoked sooner.       Influenza A by PCR NEGATIVE NEGATIVE Final   Influenza B by PCR NEGATIVE NEGATIVE Final    Comment: (NOTE) The Xpert Xpress  SARS-CoV-2/FLU/RSV plus assay is intended as an aid in the diagnosis of influenza from Nasopharyngeal swab specimens and should not be used as a sole basis for treatment. Nasal washings and aspirates are unacceptable for Xpert Xpress SARS-CoV-2/FLU/RSV testing.  Fact Sheet for Patients: EntrepreneurPulse.com.au  Fact Sheet for Healthcare Providers: IncredibleEmployment.be  This test is not yet approved or cleared by the [place] FDA and has been authorized for detection and/or diagnosis of SARS-CoV-2 by FDA under an Emergency Use Authorization (EUA). This EUA will remain in effect (meaning this test  can be used) for the duration of the COVID-19 declaration under Section 564(b)(1) of the Act, 21 U.S.C. section 360bbb-3(b)(1), unless the authorization is terminated or revoked.  Performed at [facility], [place] [address] [place], [place] [address]   Culture, blood (Routine x 2)     Status: None   Collection Time: 02/12/21  5:25 PM   Specimen: Left Antecubital; Blood  Result Value Ref Range Status   Specimen Description LEFT ANTECUBITAL  Final   Special Requests   Final    BOTTLES DRAWN AEROBIC AND ANAEROBIC Blood Culture adequate volume   Culture   Final    NO GROWTH 5 DAYS Performed at [facility], [place] [address], [place], [place] [address]    Report Status 02/17/2021 FINAL  Final  MRSA Next Gen by PCR, Nasal     Status: None   Collection Time: 02/13/21  7:12 AM   Specimen: Nasal Mucosa; Nasal Swab  Result Value Ref Range Status   MRSA by PCR Next Gen NOT DETECTED NOT DETECTED Final    Comment: (NOTE) The GeneXpert MRSA Assay (FDA approved for NASAL specimens only), is one component of a comprehensive MRSA colonization surveillance program. It is not intended to diagnose MRSA infection nor to guide or monitor treatment for MRSA infections. Test performance is not FDA approved in patients less than 76 years old. Performed  at [facility], [place] [address], [place], [place] [address]           Radiology Studies: No results found.      Scheduled Meds:  apixaban  10 mg Oral BID   Followed by   Derrill Memo ON 02/22/2021] apixaban  5 mg Oral BID   feeding supplement  237 mL Oral BID BM   mouth rinse  15 mL Mouth Rinse BID   memantine  14 mg Oral Daily   multivitamin with minerals  1 tablet Oral Daily   pantoprazole  40 mg Oral Daily   phosphorus  500 mg Oral BID   Continuous Infusions:  sodium chloride Stopped (02/15/21 1420)   sodium chloride 75 mL/hr at 02/17/21 1843   sodium phosphate  Dextrose 5% IVPB 30 mmol (02/18/21 0951)     LOS: 6 days    Time spent: 35 minutes.     Elmarie Shiley, MD [facility]   If 7PM-7AM, please contact night-coverage [email or web address]  02/18/2021, 1:12 PM

## 2021-02-18 NOTE — [place] FL2 (Signed)
[place] MEDICAID FL2 LEVEL OF CARE SCREENING TOOL     IDENTIFICATION  Patient Name: [name] [name] Birthdate: 03/10/1931 Sex: female Admission Date (Current Location): 02/12/2021  [facility] and [place] Number:  Herbalist and Address:  [facility], [place] [address], [place], [place] [address]      Provider Number: [id number]  Attending Physician Name and Address:  Elmarie Shiley, MD  Relative Name and Phone Number:  Irena Reichmann (Daughter)   [phone] [facility] Phone)    Current Level of Care: Hospital Recommended Level of Care: [place] Prior Approval Number:    Date Approved/Denied:   PASRR Number: [id number]  Discharge Plan: SNF    Current Diagnoses: Patient Active Problem List   Diagnosis Date Noted   Protein-calorie malnutrition, severe 02/14/2021   Pressure injury of skin 02/13/2021   Pulmonary embolism ([place]) 02/12/2021   Shock ([place])    AKI (acute kidney injury) ([place])    History of cataract extraction 10/12/2020   Gastroesophageal reflux disease 10/12/2020   Allergic rhinitis 10/12/2020   Alzheimer's disease ([place]) 10/12/2020   Mild obstructive sleep apnea-hypopnea syndrome 12/31/2018   Episodes of formed visual hallucinations 12/31/2018   OAB (overactive bladder) 09/21/2016   Atherosclerotic PVD with intermittent claudication ([place]) 03/21/2012   Hyperlipidemia 06/28/2011   Essential hypertension, benign 06/28/2011   Osteoarthritis 06/28/2011   Cardiomegaly 02/07/2011    Orientation RESPIRATION BLADDER Height & Weight     Self  O2 (1L [place]) Incontinent, External catheter Weight: 125 lb 7.1 oz (56.9 kg) Height:     BEHAVIORAL SYMPTOMS/MOOD NEUROLOGICAL BOWEL NUTRITION STATUS      Incontinent Diet (SEE d/c summary)  AMBULATORY STATUS COMMUNICATION OF NEEDS Skin   Extensive Assist Verbally PU Stage and Appropriate Care (Pressure Injury Buttocks, Right, stage 2; Pressure injury sacrum, mid, stage 2;  Incision right groin)                       Personal Care Assistance Level of Assistance  Bathing, Feeding, Dressing Bathing Assistance: Maximum assistance Feeding assistance: Independent Dressing Assistance: Maximum assistance     Functional Limitations Info  Sight, Hearing, Speech Sight Info: Adequate Hearing Info: Adequate Speech Info: Adequate    SPECIAL CARE FACTORS FREQUENCY  PT (By licensed PT), OT (By licensed OT)     PT Frequency: 5x/week OT Frequency: 5x/week            Contractures Contractures Info: Not present    Additional Factors Info  Code Status, Allergies Code Status Info: Full code Allergies Info: no known allergies           Current Medications (02/18/2021):  This is the current hospital active medication list Current Facility-Administered Medications  Medication Dose Route Frequency Provider Last Rate Last Admin   0.9 %  sodium chloride infusion  250 mL Intravenous Continuous Estill Cotta, NP   Stopped at 02/15/21 1420   0.9 %  sodium chloride infusion   Intravenous Continuous Regalado, Belkys A, MD 75 mL/hr at 02/17/21 1843 Infusion Verify at 02/17/21 1843   acetaminophen (TYLENOL) tablet 650 mg  650 mg Oral Q6H PRN Estill Cotta, NP   650 mg at 02/17/21 2012   apixaban (ELIQUIS) tablet 10 mg  10 mg Oral BID Magdalen Spatz, NP   10 mg at 02/18/21 0272   Followed by   Derrill Memo ON 02/22/2021] apixaban (ELIQUIS) tablet 5 mg  5 mg Oral BID Magdalen Spatz, NP  docusate sodium (COLACE) capsule 100 mg  100 mg Oral BID PRN Estill Cotta, NP       feeding supplement (ENSURE ENLIVE / ENSURE PLUS) liquid 237 mL  237 mL Oral BID BM Omar Person, NP   237 mL at 02/18/21 0955   MEDLINE mouth rinse  15 mL Mouth Rinse BID Collene Gobble, MD   15 mL at 02/18/21 0955   memantine (NAMENDA XR) 24 hr capsule 14 mg  14 mg Oral Daily Omar Person, NP   14 mg at 02/18/21 0940   multivitamin with minerals tablet 1 tablet  1 tablet  Oral Daily Omar Person, NP   1 tablet at 02/18/21 7680   pantoprazole (PROTONIX) EC tablet 40 mg  40 mg Oral Daily Omar Person, NP   40 mg at 02/18/21 8811   phosphorus (K PHOS NEUTRAL) tablet 500 mg  500 mg Oral BID Regalado, Belkys A, MD   500 mg at 02/18/21 0952   polyethylene glycol (MIRALAX / GLYCOLAX) packet 17 g  17 g Oral Daily PRN Estill Cotta, NP       sodium phosphate 30 mmol in dextrose 5 % 250 mL infusion  30 mmol Intravenous Once Regalado, Belkys A, MD 43 mL/hr at 02/18/21 0951 30 mmol at 02/18/21 0315     Discharge Medications: Please see discharge summary for a list of discharge medications.  Relevant Imaging Results:  Relevant Lab Results:   Additional Information SSN [place], [place]

## 2021-02-18 NOTE — Care Management (Addendum)
NCM called patient's daughter [name]. Janette just told TOC SW this morning she wants to take her mother home at discharge.     NCM reviewed PT/OT notes with Janette. Discussed patient requiring two person  assistance. [name] stated she would hire help. From progression meeting this morning patient is ready for discharge. [name] stated she would need time to get ready. NCM explained NCM will order equipment to be delivered to her home. Patient has a bedside commode already. Recommendations for wheel chair and hoyer lift. NCM explained what a hoyer lift is and how it works. Also on delivery [employer] will provide education. Also a hospital bed.   With patient requiring a lot of assistance discussed ambulance transport home.   NCM also discuss home health will not be in the home daily or for long periods at a time.    Janette voiced understanding.   Janette asked about SNF placement progress. NCM explained same. Sherryl Manges does not want to place her mother in a SNF , but also unsure if she can provide care at home.   [name] will call her son who lives out of town and see what his opinion is. [name] has NCM direct number and will call back with a decision this morning.   [place] called back. She has spoken to her son. She has decided on SNF. NCM explained TOC team will complete FL2 and fax. TOC team will call her back this afternoon with bed offers and start insurance authorization. Janette voiced understanding.

## 2021-02-19 LAB — BASIC METABOLIC PANEL
Anion gap: 6 (ref 5–15)
BUN: 6 mg/dL — ABNORMAL LOW (ref 8–23)
CO2: 23 mmol/L (ref 22–32)
Calcium: 8.2 mg/dL — ABNORMAL LOW (ref 8.9–10.3)
Chloride: 105 mmol/L (ref 98–111)
Creatinine, Ser: 0.48 mg/dL (ref 0.44–1.00)
GFR, Estimated: >60 mL/min (ref >60)
Glucose, Bld: 94 mg/dL (ref 70–99)
Potassium: 3.6 mmol/L (ref 3.5–5.1)
Sodium: 134 mmol/L — ABNORMAL LOW (ref 135–145)

## 2021-02-19 LAB — CBC
HCT: 27.5 % — ABNORMAL LOW (ref 36.0–46.0)
Hemoglobin: 9.3 g/dL — ABNORMAL LOW (ref 12.0–15.0)
MCH: 33.5 pg (ref 26.0–34.0)
MCHC: 33.8 g/dL (ref 30.0–36.0)
MCV: 98.9 fL (ref 80.0–100.0)
Platelets: 271 10*3/uL (ref 150–400)
RBC: 2.78 MIL/uL — ABNORMAL LOW (ref 3.87–5.11)
RDW: 13.2 % (ref 11.5–15.5)
WBC: 6.6 10*3/uL (ref 4.0–10.5)
nRBC: 0 % (ref 0.0–0.2)

## 2021-02-19 LAB — MAGNESIUM: Magnesium: 1.8 mg/dL (ref 1.7–2.4)

## 2021-02-19 LAB — PHOSPHORUS: Phosphorus: 2.5 mg/dL (ref 2.5–4.6)

## 2021-02-19 MED ORDER — MAGNESIUM SULFATE 2 GM/50ML IV SOLN
2.0000 g | Freq: Once | INTRAVENOUS | Status: AC
  Administered 2021-02-19: 2 g via INTRAVENOUS
  Filled 2021-02-19: qty 50

## 2021-02-19 NOTE — TOC Progression Note (Signed)
Transition of Care [facility]) - Progression Note    Patient Details  Name: [name] [name] MRN: [id number] Date of Birth: 08-Jan-1932  [facility] CM/SW Contact  Emeterio Reeve,  Phone Number: 02/19/2021, 10:01 AM  Clinical Narrative:      Pt has no bed offers at this time. CSW will continue to monitor.       Expected Discharge Plan and Services                                                 Social Determinants of Health (SDOH) Interventions    Readmission Risk Interventions No flowsheet data found.  Emeterio Reeve, LCSW Clinical Social Worker

## 2021-02-19 NOTE — Progress Notes (Signed)
PROGRESS NOTE    [name] [name]  HER:[id number] DOB: March 22, 1931 DOA: 02/12/2021 PCP: Denita Lung, MD   Brief Narrative: 87 year old past medical history significant for A. fib not on anticoagulation, GERD, hypertension, PVD, OSA, dementia who has had since 12/6 mental decline at home, poor oral intake.  Patient presented lethargic, febrile on arrival temperature 104, leukocytosis, CTA chest was obtained which was concerning for PE and right and left pulmonary artery, lobar arterial and multiple bilateral segmental and subsegmental PE.  Concern for RV strain.  She became hypotensive in the ED.  Received IV fluid and started on Levophed.  Started on broad-spectrum antibiotics.  CCM admitted patient.  Hospital Course:  12/10 Admit, large BL PE, +UA, Vanc cefepime, to IR for thrombectomy Underwent thrombectomy under MAC 12/11 U/S BLE 12/11 > +left femoral, popliteal, posterior tibial vein DVT  TTE 12/11 >> LV normal fxn, Grade 1 diastolic dysfunction, RV function moderately reduced, RV size moderately enlarged, pericardial effusion is posterior to the left ventricle, MV is abnormal with regurgitation , no stenosis, Aortic valve tricuspid with mild calcification and mild regurgitation , sclerosis/calcification is present, without any evidence of aortic  stenosis.  Inferior vena cava is normal size, suggesting RAP of 3 mm Hg.    Assessment & Plan:   Principal Problem:   Pulmonary embolism (HCC) Active Problems:   Pressure injury of skin   Protein-calorie malnutrition, severe  1-Acute metabolic encephalopathy in the setting of sepsis shock and UTI improving; Dementia: Continue with Namenda Stable.   2-Shock multifactorial secondary to PE and sepsis secondary to UTI. -Patient was weaned off of Levophed. -Blood cultures: No growth to date.  -Urine culture, not obtained. -IV antibiotics, ceftriaxone. Completed antibiotics: 4 days antibiotics.  -BP low side 12/15, improved with IV fluids.    3-Submassive PE, lower extremity DVT: Status post thrombectomy by IR 12/11. She was initially treated with heparin drip, now transition to [employer].  Stable.   A. fib, hypertension, PVD: Aspirin on hold Continue with statins.  Beta Blocker on hold due to hypotension on admission  Hypokalemia: Hypokalemia replaced.  Mg at 1.8 Hypophosphatemia; replete IV  Anemia of critical illness: Hb stable.  B 12 elevated.   Pressure injury sacral stage II POA Buttock stage II POA Wound care  AKI: Cr peak to 1.3 Improved with support.   Severe Malnutrition Nutrition consulted. On ensure  GERD: Continue with PPI  Feet pain: Continue with Tylenol. Elevation of troponin:  Related to PE>    Pressure Injury 02/13/21 Sacrum Mid Stage 2 -  Partial thickness loss of dermis presenting as a shallow open injury with a red, pink wound bed without slough. pink (Active)  02/13/21 0200  Location: Sacrum  Location Orientation: Mid  Staging: Stage 2 -  Partial thickness loss of dermis presenting as a shallow open injury with a red, pink wound bed without slough.  Wound Description (Comments): pink  Present on Admission: Yes     Pressure Injury 02/13/21 Buttocks Right Stage 2 -  Partial thickness loss of dermis presenting as a shallow open injury with a red, pink wound bed without slough. pink (Active)  02/13/21 0200  Location: Buttocks  Location Orientation: Right  Staging: Stage 2 -  Partial thickness loss of dermis presenting as a shallow open injury with a red, pink wound bed without slough.  Wound Description (Comments): pink  Present on Admission: Yes     Nutrition Problem: Severe Malnutrition Etiology: chronic illness (dementia)  Signs/Symptoms: severe muscle depletion, severe fat depletion, percent weight loss (10.8% weight loss in 3 months) Percent weight loss: 10.8 %    Interventions: Ensure Enlive (each supplement provides 350kcal and 20 grams of  protein), MVI, Magic cup, Liberalize Diet, Other (Comment) (feeding assist)  Estimated body mass index is 25.43 kg/m as calculated from the following:   Height as of 10/12/20: _0  (1.499 m).   Weight as of this encounter: 57.1 kg.   DVT prophylaxis: eliquis Code Status: Full code Family Communication: Daughter Updated 12/16 Disposition Plan:  Status is: Inpatient  Remains inpatient appropriate because: PE, shock.   Disposition: discharge when bed available.       Consultants:  CCM   Procedures:  Thrombectomy   Antimicrobials:    Subjective: Alert, pleasantly confuse. Denies pain   Objective: Vitals:   02/18/21 1530 02/18/21 2010 02/19/21 0511 02/19/21 0758  BP: 112/63 114/65 111/76 110/66  Pulse: 80 76 77 78  Resp: _1 Temp: 98.5 F (36.9 C) 98 F (36.7 C) 98 F (36.7 C) 98.4 F (36.9 C)  TempSrc: Oral   Oral  SpO2: 100% 100%  100%  Weight:   57.1 kg     Intake/Output Summary (Last 24 hours) at 02/19/2021 1440 Last data filed at 02/18/2021 1841 Gross per 24 hour  Intake 100 ml  Output --  Net 100 ml    Filed Weights   02/15/21 0500 02/16/21 0415 02/19/21 0511  Weight: 53 kg 56.9 kg 57.1 kg    Examination:  General exam: NAD Respiratory system: CTA Cardiovascular system:  S 1 , S 2 RRR Gastrointestinal system: BS present, soft, nt Central nervous system: Alert Extremities: no edema    Data Reviewed: I have personally reviewed following labs and imaging studies  CBC: Recent Labs  Lab 02/12/21 1556 02/13/21 0329 02/14/21 0500 02/15/21 0505 02/16/21 0515 02/18/21 0800 02/19/21 1044  WBC 12.4*   < > 12.5* 11.4* 9.7 8.0 6.6  NEUTROABS 9.6*  --   --   --  7.4  --   --   HGB 14.6   < > 9.5* 9.0* 9.2* 8.2* 9.3*  HCT 44.7   < > 28.0* 26.6* 27.1* 24.6* 27.5*  MCV 101.4*   < > 96.2 97.1 97.5 99.2 98.9  PLT 180   < > 143* 135* 152 214 271   < > = values in this interval not displayed.    Basic Metabolic Panel: Recent Labs   Lab 02/15/21 0505 02/16/21 0515 02/17/21 0227 02/18/21 0152 02/19/21 1044  NA 132* 133* 133* 132* 134*  K 3.0* 3.1* 3.4* 4.2 3.6  CL 104 104 103 104 105  CO2 21* 24 25 21* 23  GLUCOSE 104* 94 92 89 94  BUN 15 9 7* 8 6*  CREATININE 0.62 0.57 0.54 0.52 0.48  CALCIUM 8.4* 8.4* 8.3* 8.4* 8.2*  MG 2.1 1.8 2.0 1.8 1.8  PHOS 1.9* 2.0* 2.1* 2.0* 2.5    GFR: Estimated Creatinine Clearance: 38.2 mL/min (by C-G formula based on SCr of 0.48 mg/dL). Liver Function Tests: Recent Labs  Lab 02/12/21 1556  AST 29  ALT 14  ALKPHOS 58  BILITOT 1.7*  PROT 7.4  ALBUMIN 3.1*    Recent Labs  Lab 02/12/21 1556  LIPASE 22    No results for input(s): AMMONIA in the last 168 hours. Coagulation Profile: Recent Labs  Lab 02/12/21 1556  INR 1.3*    Cardiac Enzymes: No results for input(s): CKTOTAL, CKMB,  CKMBINDEX, TROPONINI in the last 168 hours. BNP (last 3 results) No results for input(s): PROBNP in the last 8760 hours. HbA1C: No results for input(s): HGBA1C in the last 72 hours. CBG: Recent Labs  Lab 02/13/21 0202  GLUCAP 161*    Lipid Profile: No results for input(s): CHOL, HDL, LDLCALC, TRIG, CHOLHDL, LDLDIRECT in the last 72 hours. Thyroid Function Tests: No results for input(s): TSH, T4TOTAL, FREET4, T3FREE, THYROIDAB in the last 72 hours. Anemia Panel: Recent Labs    02/18/21 0152  VITAMINB12 1,710*    Sepsis Labs: Recent Labs  Lab 02/12/21 1556 02/12/21 2124 02/13/21 1335 02/13/21 1723 02/14/21 0500 02/15/21 0505  PROCALCITON  --   --  0.14  --  0.11 <0.10  LATICACIDVEN 2.0* 4.4* 1.4 1.3  --   --      Recent Results (from the past 240 hour(s))  Culture, blood (Routine x 2)     Status: None   Collection Time: 02/12/21  3:56 PM   Specimen: BLOOD RIGHT FOREARM  Result Value Ref Range Status   Specimen Description BLOOD RIGHT FOREARM  Final   Special Requests   Final    BOTTLES DRAWN AEROBIC AND ANAEROBIC Blood Culture results may not be optimal  due to an inadequate volume of blood received in culture bottles   Culture   Final    NO GROWTH 5 DAYS Performed at [facility], [place] [address] [place], [place] [address]    Report Status 02/17/2021 FINAL  Final  Resp Panel by RT-PCR (Flu A&B, Covid)     Status: None   Collection Time: 02/12/21  3:56 PM   Specimen: Nasopharyngeal(NP) swabs in vial transport medium  Result Value Ref Range Status   SARS Coronavirus 2 by RT PCR NEGATIVE NEGATIVE Final    Comment: (NOTE) SARS-CoV-2 target nucleic acids are NOT DETECTED.  The SARS-CoV-2 RNA is generally detectable in upper respiratory specimens during the acute phase of infection. The lowest concentration of SARS-CoV-2 viral copies this assay can detect is 138 copies/mL. A negative result does not preclude SARS-Cov-2 infection and should not be used as the sole basis for treatment or other patient management decisions. A negative result may occur with  improper specimen collection/handling, submission of specimen other than nasopharyngeal swab, presence of viral mutation(s) within the areas targeted by this assay, and inadequate number of viral copies(<138 copies/mL). A negative result must be combined with clinical observations, patient history, and epidemiological information. The expected result is Negative.  Fact Sheet for Patients:  EntrepreneurPulse.com.au  Fact Sheet for Healthcare Providers:  IncredibleEmployment.be  This test is no t yet approved or cleared by the [place] FDA and  has been authorized for detection and/or diagnosis of SARS-CoV-2 by FDA under an Emergency Use Authorization (EUA). This EUA will remain  in effect (meaning this test can be used) for the duration of the COVID-19 declaration under Section 564(b)(1) of the Act, 21 U.S.C.section 360bbb-3(b)(1), unless the authorization is terminated  or revoked sooner.       Influenza A by PCR NEGATIVE NEGATIVE  Final   Influenza B by PCR NEGATIVE NEGATIVE Final    Comment: (NOTE) The Xpert Xpress SARS-CoV-2/FLU/RSV plus assay is intended as an aid in the diagnosis of influenza from Nasopharyngeal swab specimens and should not be used as a sole basis for treatment. Nasal washings and aspirates are unacceptable for Xpert Xpress SARS-CoV-2/FLU/RSV testing.  Fact Sheet for Patients: EntrepreneurPulse.com.au  Fact Sheet for Healthcare Providers: IncredibleEmployment.be  This  test is not yet approved or cleared by the [place] and has been authorized for detection and/or diagnosis of SARS-CoV-2 by FDA under an Emergency Use Authorization (EUA). This EUA will remain in effect (meaning this test can be used) for the duration of the COVID-19 declaration under Section 564(b)(1) of the Act, 21 U.S.C. section 360bbb-3(b)(1), unless the authorization is terminated or revoked.  Performed at [facility], [place] [address], [place], [place] [address]   Culture, blood (Routine x 2)     Status: None   Collection Time: 02/12/21  5:25 PM   Specimen: Left Antecubital; Blood  Result Value Ref Range Status   Specimen Description LEFT ANTECUBITAL  Final   Special Requests   Final    BOTTLES DRAWN AEROBIC AND ANAEROBIC Blood Culture adequate volume   Culture   Final    NO GROWTH 5 DAYS Performed at [facility], [place] [address], [place], [place] [address]    Report Status 02/17/2021 FINAL  Final  MRSA Next Gen by PCR, Nasal     Status: None   Collection Time: 02/13/21  7:12 AM   Specimen: Nasal Mucosa; Nasal Swab  Result Value Ref Range Status   MRSA by PCR Next Gen NOT DETECTED NOT DETECTED Final    Comment: (NOTE) The GeneXpert MRSA Assay (FDA approved for NASAL specimens only), is one component of a comprehensive MRSA colonization surveillance program. It is not intended to diagnose MRSA infection nor to guide or monitor treatment for MRSA  infections. Test performance is not FDA approved in patients less than 22 years old. Performed at [facility], [place] [address], [place], [place] [address]           Radiology Studies: No results found.      Scheduled Meds:  apixaban  10 mg Oral BID   Followed by   Derrill Memo ON 02/22/2021] apixaban  5 mg Oral BID   feeding supplement  237 mL Oral BID BM   ferrous sulfate  325 mg Oral Q breakfast   mouth rinse  15 mL Mouth Rinse BID   memantine  14 mg Oral Daily   multivitamin with minerals  1 tablet Oral Daily   pantoprazole  40 mg Oral Daily   phosphorus  500 mg Oral BID   Continuous Infusions:  sodium chloride Stopped (02/15/21 1420)   sodium chloride 50 mL/hr at 02/19/21 0855   magnesium sulfate bolus IVPB       LOS: 7 days    Time spent: 35 minutes.     Elmarie Shiley, MD [facility]   If 7PM-7AM, please contact night-coverage [email or web address]  02/19/2021, 2:40 PM

## 2021-02-20 MED ORDER — MAGNESIUM SULFATE 2 GM/50ML IV SOLN
2.0000 g | Freq: Once | INTRAVENOUS | Status: AC
  Administered 2021-02-20: 15:00:00 2 g via INTRAVENOUS
  Filled 2021-02-20: qty 50

## 2021-02-20 MED ORDER — ENSURE ENLIVE PO LIQD
237.0000 mL | Freq: Three times a day (TID) | ORAL | Status: DC
  Administered 2021-02-20 – 2021-02-23 (×8): 237 mL via ORAL

## 2021-02-20 MED ORDER — POTASSIUM CHLORIDE CRYS ER 20 MEQ PO TBCR
40.0000 meq | EXTENDED_RELEASE_TABLET | Freq: Once | ORAL | Status: AC
  Administered 2021-02-20: 15:00:00 40 meq via ORAL
  Filled 2021-02-20: qty 2

## 2021-02-20 NOTE — Plan of Care (Signed)
  Problem: Pain Managment: Goal: General experience of comfort will improve Outcome: Progressing   Problem: Safety: Goal: Ability to remain free from injury will improve Outcome: Progressing   

## 2021-02-20 NOTE — Progress Notes (Addendum)
PROGRESS NOTE    [name] [name]  ZDG:[id number] DOB: 1932-02-21 DOA: 02/12/2021 PCP: Denita Lung, MD   Brief Narrative: 87 year old past medical history significant for A. fib not on anticoagulation, GERD, hypertension, PVD, OSA, dementia who has had since 12/6 mental decline at home, poor oral intake.  Patient presented lethargic, febrile on arrival temperature 104, leukocytosis, CTA chest was obtained which was concerning for PE and right and left pulmonary artery, lobar arterial and multiple bilateral segmental and subsegmental PE.  Concern for RV strain.  She became hypotensive in the ED.  Received IV fluid and started on Levophed.  Started on broad-spectrum antibiotics.  CCM admitted patient.  Hospital Course:  12/10 Admit, large BL PE, +UA, Vanc cefepime, to IR for thrombectomy Underwent thrombectomy under MAC 12/11 U/S BLE 12/11 > +left femoral, popliteal, posterior tibial vein DVT  TTE 12/11 >> LV normal fxn, Grade 1 diastolic dysfunction, RV function moderately reduced, RV size moderately enlarged, pericardial effusion is posterior to the left ventricle, MV is abnormal with regurgitation , no stenosis, Aortic valve tricuspid with mild calcification and mild regurgitation , sclerosis/calcification is present, without any evidence of aortic  stenosis.  Inferior vena cava is normal size, suggesting RAP of 3 mm Hg.    Assessment & Plan:   Principal Problem:   Pulmonary embolism (HCC) Active Problems:   Pressure injury of skin   Protein-calorie malnutrition, severe  1-Acute metabolic encephalopathy in the setting of sepsis shock and UTI improving; Dementia: Continue with Namenda Stable. Pleasantly confuse.   2-Shock multifactorial secondary to PE and sepsis secondary to UTI. -Patient was weaned off of Levophed. -Blood cultures: No growth to date.  -Urine culture, not obtained. -Completed antibiotics: 4 days Ceftriaxone.  -BP stable.   3-Submassive PE, lower extremity  DVT: Status post thrombectomy by IR 12/11. She was initially treated with heparin drip, now transition to [employer].  Stable.   A. fib, hypertension, PVD: Aspirin on hold Continue with statins.  Beta Blocker on hold due to hypotension on admission  Hypokalemia: Hypokalemia replaced.  Mg at 1.8. replete IV Hypophosphatemia; replaced.   Anemia of critical illness: Hb stable.  B 12 elevated.   Pressure injury sacral stage II POA Buttock stage II POA Wound care  AKI: Cr peak to 1.3 Improved with support.   Severe Malnutrition Nutrition consulted. On ensure  GERD: Continue with PPI  Feet pain: Continue with Tylenol. Elevation of troponin:  Related to PE>    Pressure Injury 02/13/21 Sacrum Mid Stage 2 -  Partial thickness loss of dermis presenting as a shallow open injury with a red, pink wound bed without slough. pink (Active)  02/13/21 0200  Location: Sacrum  Location Orientation: Mid  Staging: Stage 2 -  Partial thickness loss of dermis presenting as a shallow open injury with a red, pink wound bed without slough.  Wound Description (Comments): pink  Present on Admission: Yes     Pressure Injury 02/13/21 Buttocks Right Stage 2 -  Partial thickness loss of dermis presenting as a shallow open injury with a red, pink wound bed without slough. pink (Active)  02/13/21 0200  Location: Buttocks  Location Orientation: Right  Staging: Stage 2 -  Partial thickness loss of dermis presenting as a shallow open injury with a red, pink wound bed without slough.  Wound Description (Comments): pink  Present on Admission: Yes     Nutrition Problem: Severe Malnutrition Etiology: chronic illness (dementia)    Signs/Symptoms: severe muscle depletion, severe  fat depletion, percent weight loss (10.8% weight loss in 3 months) Percent weight loss: 10.8 %    Interventions: Ensure Enlive (each supplement provides 350kcal and 20 grams of protein), MVI, Magic cup,  Liberalize Diet, Other (Comment) (feeding assist)  Estimated body mass index is 25.25 kg/m as calculated from the following:   Height as of 10/12/20: _0  (1.499 m).   Weight as of this encounter: 56.7 kg.   DVT prophylaxis: eliquis Code Status: Full code Family Communication: Daughter Updated 12/16---12/18 Disposition Plan:  Status is: Inpatient  Remains inpatient appropriate because: PE, shock.   Disposition: discharge when bed available.       Consultants:  CCM   Procedures:  Thrombectomy   Antimicrobials:    Subjective: Alert, no new complaints.   Objective: Vitals:   02/19/21 1617 02/20/21 0432 02/20/21 0451 02/20/21 0814  BP: 118/68 126/78  117/63  Pulse: 77 77  73  Resp: _1 Temp: 98.3 F (36.8 C) 98 F (36.7 C)  98.2 F (36.8 C)  TempSrc: Oral     SpO2: 100% 100%  100%  Weight:   56.7 kg     Intake/Output Summary (Last 24 hours) at 02/20/2021 1313 Last data filed at 02/20/2021 0442 Gross per 24 hour  Intake 1774.63 ml  Output --  Net 1774.63 ml    Filed Weights   02/16/21 0415 02/19/21 0511 02/20/21 0451  Weight: 56.9 kg 57.1 kg 56.7 kg    Examination:  General exam: NAD Respiratory system: CTA Cardiovascular system:  S 1, S 2 RRR Gastrointestinal system: BS present, soft, nt Central nervous system: Alert, follows command Extremities: No edema    Data Reviewed: I have personally reviewed following labs and imaging studies  CBC: Recent Labs  Lab 02/14/21 0500 02/15/21 0505 02/16/21 0515 02/18/21 0800 02/19/21 1044  WBC 12.5* 11.4* 9.7 8.0 6.6  NEUTROABS  --   --  7.4  --   --   HGB 9.5* 9.0* 9.2* 8.2* 9.3*  HCT 28.0* 26.6* 27.1* 24.6* 27.5*  MCV 96.2 97.1 97.5 99.2 98.9  PLT 143* 135* 152 214 202    Basic Metabolic Panel: Recent Labs  Lab 02/15/21 0505 02/16/21 0515 02/17/21 0227 02/18/21 0152 02/19/21 1044  NA 132* 133* 133* 132* 134*  K 3.0* 3.1* 3.4* 4.2 3.6  CL 104 104 103 104 105  CO2 21* 24 25  21* 23  GLUCOSE 104* 94 92 89 94  BUN 15 9 7* 8 6*  CREATININE 0.62 0.57 0.54 0.52 0.48  CALCIUM 8.4* 8.4* 8.3* 8.4* 8.2*  MG 2.1 1.8 2.0 1.8 1.8  PHOS 1.9* 2.0* 2.1* 2.0* 2.5    GFR: Estimated Creatinine Clearance: 38 mL/min (by C-G formula based on SCr of 0.48 mg/dL). Liver Function Tests: No results for input(s): AST, ALT, ALKPHOS, BILITOT, PROT, ALBUMIN in the last 168 hours.  No results for input(s): LIPASE, AMYLASE in the last 168 hours.  No results for input(s): AMMONIA in the last 168 hours. Coagulation Profile: No results for input(s): INR, PROTIME in the last 168 hours.  Cardiac Enzymes: No results for input(s): CKTOTAL, CKMB, CKMBINDEX, TROPONINI in the last 168 hours. BNP (last 3 results) No results for input(s): PROBNP in the last 8760 hours. HbA1C: No results for input(s): HGBA1C in the last 72 hours. CBG: No results for input(s): GLUCAP in the last 168 hours.  Lipid Profile: No results for input(s): CHOL, HDL, LDLCALC, TRIG, CHOLHDL, LDLDIRECT in the last 72 hours. Thyroid Function  Tests: No results for input(s): TSH, T4TOTAL, FREET4, T3FREE, THYROIDAB in the last 72 hours. Anemia Panel: Recent Labs    02/18/21 0152  VITAMINB12 1,710*    Sepsis Labs: Recent Labs  Lab 02/13/21 1335 02/13/21 1723 02/14/21 0500 02/15/21 0505  PROCALCITON 0.14  --  0.11 <0.10  LATICACIDVEN 1.4 1.3  --   --      Recent Results (from the past 240 hour(s))  Culture, blood (Routine x 2)     Status: None   Collection Time: 02/12/21  3:56 PM   Specimen: BLOOD RIGHT FOREARM  Result Value Ref Range Status   Specimen Description BLOOD RIGHT FOREARM  Final   Special Requests   Final    BOTTLES DRAWN AEROBIC AND ANAEROBIC Blood Culture results may not be optimal due to an inadequate volume of blood received in culture bottles   Culture   Final    NO GROWTH 5 DAYS Performed at [facility], [place] [address] [place],  [address]    Report Status 02/17/2021  FINAL  Final  Resp Panel by RT-PCR (Flu A&B, Covid)     Status: None   Collection Time: 02/12/21  3:56 PM   Specimen: Nasopharyngeal(NP) swabs in vial transport medium  Result Value Ref Range Status   SARS Coronavirus 2 by RT PCR NEGATIVE NEGATIVE Final    Comment: (NOTE) SARS-CoV-2 target nucleic acids are NOT DETECTED.  The SARS-CoV-2 RNA is generally detectable in upper respiratory specimens during the acute phase of infection. The lowest concentration of SARS-CoV-2 viral copies this assay can detect is 138 copies/mL. A negative result does not preclude SARS-Cov-2 infection and should not be used as the sole basis for treatment or other patient management decisions. A negative result may occur with  improper specimen collection/handling, submission of specimen other than nasopharyngeal swab, presence of viral mutation(s) within the areas targeted by this assay, and inadequate number of viral copies(<138 copies/mL). A negative result must be combined with clinical observations, patient history, and epidemiological information. The expected result is Negative.  Fact Sheet for Patients:  EntrepreneurPulse.com.au  Fact Sheet for Healthcare Providers:  IncredibleEmployment.be  This test is no t yet approved or cleared by the [place] FDA and  has been authorized for detection and/or diagnosis of SARS-CoV-2 by FDA under an Emergency Use Authorization (EUA). This EUA will remain  in effect (meaning this test can be used) for the duration of the COVID-19 declaration under Section 564(b)(1) of the Act, 21 U.S.C.section 360bbb-3(b)(1), unless the authorization is terminated  or revoked sooner.       Influenza A by PCR NEGATIVE NEGATIVE Final   Influenza B by PCR NEGATIVE NEGATIVE Final    Comment: (NOTE) The Xpert Xpress SARS-CoV-2/FLU/RSV plus assay is intended as an aid in the diagnosis of influenza from Nasopharyngeal swab specimens  and should not be used as a sole basis for treatment. Nasal washings and aspirates are unacceptable for Xpert Xpress SARS-CoV-2/FLU/RSV testing.  Fact Sheet for Patients: EntrepreneurPulse.com.au  Fact Sheet for Healthcare Providers: IncredibleEmployment.be  This test is not yet approved or cleared by the [place] FDA and has been authorized for detection and/or diagnosis of SARS-CoV-2 by FDA under an Emergency Use Authorization (EUA). This EUA will remain in effect (meaning this test can be used) for the duration of the COVID-19 declaration under Section 564(b)(1) of the Act, 21 U.S.C. section 360bbb-3(b)(1), unless the authorization is terminated or revoked.  Performed at [facility], [place]  [address], [place], [place] [address]   Culture, blood (Routine x 2)     Status: None   Collection Time: 02/12/21  5:25 PM   Specimen: Left Antecubital; Blood  Result Value Ref Range Status   Specimen Description LEFT ANTECUBITAL  Final   Special Requests   Final    BOTTLES DRAWN AEROBIC AND ANAEROBIC Blood Culture adequate volume   Culture   Final    NO GROWTH 5 DAYS Performed at [facility], [place] [address] [place], [place] [address]    Report Status 02/17/2021 FINAL  Final  MRSA Next Gen by PCR, Nasal     Status: None   Collection Time: 02/13/21  7:12 AM   Specimen: Nasal Mucosa; Nasal Swab  Result Value Ref Range Status   MRSA by PCR Next Gen NOT DETECTED NOT DETECTED Final    Comment: (NOTE) The GeneXpert MRSA Assay (FDA approved for NASAL specimens only), is one component of a comprehensive MRSA colonization surveillance program. It is not intended to diagnose MRSA infection nor to guide or monitor treatment for MRSA infections. Test performance is not FDA approved in patients less than 16 years old. Performed at [facility], [place] [address], [place], [place] [address]           Radiology Studies: No results  found.      Scheduled Meds:  apixaban  10 mg Oral BID   Followed by   Derrill Memo ON 02/22/2021] apixaban  5 mg Oral BID   feeding supplement  237 mL Oral BID BM   ferrous sulfate  325 mg Oral Q breakfast   mouth rinse  15 mL Mouth Rinse BID   memantine  14 mg Oral Daily   multivitamin with minerals  1 tablet Oral Daily   pantoprazole  40 mg Oral Daily   phosphorus  500 mg Oral BID   Continuous Infusions:  sodium chloride Stopped (02/15/21 1420)   sodium chloride 35 mL/hr at 02/20/21 0837   magnesium sulfate bolus IVPB       LOS: 8 days    Time spent: 35 minutes.     Elmarie Shiley, MD [facility]   If 7PM-7AM, please contact night-coverage [email or web address]  02/20/2021, 1:13 PM

## 2021-02-21 LAB — BASIC METABOLIC PANEL
Anion gap: 8 (ref 5–15)
BUN: 8 mg/dL (ref 8–23)
CO2: 23 mmol/L (ref 22–32)
Calcium: 8.3 mg/dL — ABNORMAL LOW (ref 8.9–10.3)
Chloride: 104 mmol/L (ref 98–111)
Creatinine, Ser: 0.57 mg/dL (ref 0.44–1.00)
GFR, Estimated: >60 mL/min (ref >60)
Glucose, Bld: 106 mg/dL — ABNORMAL HIGH (ref 70–99)
Potassium: 3.1 mmol/L — ABNORMAL LOW (ref 3.5–5.1)
Sodium: 135 mmol/L (ref 135–145)

## 2021-02-21 MED ORDER — POTASSIUM CHLORIDE CRYS ER 20 MEQ PO TBCR
40.0000 meq | EXTENDED_RELEASE_TABLET | Freq: Once | ORAL | Status: AC
  Administered 2021-02-21: 10:00:00 40 meq via ORAL
  Filled 2021-02-21: qty 2

## 2021-02-21 MED ORDER — POTASSIUM CHLORIDE CRYS ER 20 MEQ PO TBCR
40.0000 meq | EXTENDED_RELEASE_TABLET | Freq: Once | ORAL | Status: AC
  Administered 2021-02-21: 17:00:00 40 meq via ORAL
  Filled 2021-02-21: qty 2

## 2021-02-21 NOTE — Progress Notes (Signed)
Occupational Therapy Treatment Patient Details Name: [name] [name] MRN: [id number] DOB: Aug 17, 1931 Today's Date: 02/21/2021   History of present illness 87 y/o female admitted with AMS.  PMH positive for a-fib, GERD, HTN, PVD, HLD, OSA and dementia. 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   OT comments  Patient supine in bed, incontinent of stool.  Requires total assist for hygiene, max assist for bed mobility.  Total assist for supine to/from EOB, min assist at best for static sitting balance.Limited tolerance, returned to supine with grooming with min assist.  Continue to recommend SNF. Will follow acutely.     Recommendations for follow up therapy are one component of a multi-disciplinary discharge planning process, led by the attending physician.  Recommendations may be updated based on patient status, additional functional criteria and insurance authorization.    Follow Up Recommendations  Skilled nursing-short term rehab (<3 hours/day)    Assistance Recommended at Discharge Frequent or constant Supervision/Assistance  Equipment Recommendations  Other (comment);BSC/3in1;Wheelchair (measurements OT);Wheelchair cushion (measurements OT) (hoyer)    Recommendations for Other Services      Precautions / Restrictions Precautions Precautions: Fall Restrictions Weight Bearing Restrictions: No       Mobility Bed Mobility Overal bed mobility: Needs Assistance Bed Mobility: Supine to Sit;Sit to Supine;Rolling Rolling: Max assist   Supine to sit: Total assist Sit to supine: Total assist   General bed mobility comments: able to reach with UEs and hold rails, but overall total assist    Transfers                         Balance Overall balance assessment: Needs assistance Sitting-balance support: Feet supported;Bilateral upper extremity supported;Single extremity  supported Sitting balance-Leahy Scale: Poor Sitting balance - Comments: relies on at least min assist to maintain balance without UE support Postural control: Posterior lean                                 ADL either performed or assessed with clinical judgement   ADL Overall ADL's : Needs assistance/impaired     Grooming: Set up;Wash/dry hands;Wash/dry face;Bed level                       Toileting- Clothing Manipulation and Hygiene: Total assistance;+2 for physical assistance;+2 for safety/equipment;Bed level Toileting - Clothing Manipulation Details (indicate cue type and reason): incontient of bowel     Functional mobility during ADLs: Maximal assistance (bed level) General ADL Comments: Pt requiring Max-Total A for ADLs and bed mobility. bowel incontinence at bed level. Total A for rolling in bed and completing peri care.    Extremity/Trunk Assessment Upper Extremity Assessment Upper Extremity Assessment: Generalized weakness            Vision       Perception     Praxis      Cognition Arousal/Alertness: Awake/alert Behavior During Therapy: WFL for tasks assessed/performed Overall Cognitive Status: History of cognitive impairments - at baseline                                 General Comments: hx of dementia, anticipate baseline. requires mulitmodal cueing to follow simple commands and requires cueing to attend to task  Exercises     Shoulder Instructions       General Comments      Pertinent Vitals/ Pain       Pain Assessment: Faces Faces Pain Scale: Hurts little more Pain Location: generalized Pain Descriptors / Indicators: Moaning;Discomfort;Guarding Pain Intervention(s): Limited activity within patient's tolerance;Monitored during session;Repositioned  Home Living                                          Prior Functioning/Environment              Frequency  Min 2X/week         Progress Toward Goals  OT Goals(current goals can now be found in the care plan section)  Progress towards OT goals: Progressing toward goals  Acute Rehab OT Goals Patient Stated Goal: none stated today OT Goal Formulation: Patient unable to participate in goal setting Time For Goal Achievement: 03/03/21 Potential to Achieve Goals: Good  Plan Discharge plan remains appropriate;Frequency remains appropriate    Co-evaluation                 AM-PAC OT "6 Clicks" Daily Activity     Outcome Measure   Help from another person eating meals?: A Lot Help from another person taking care of personal grooming?: A Little Help from another person toileting, which includes using toliet, bedpan, or urinal?: Total Help from another person bathing (including washing, rinsing, drying)?: Total Help from another person to put on and taking off regular upper body clothing?: Total Help from another person to put on and taking off regular lower body clothing?: Total 6 Click Score: 9    End of Session    OT Visit Diagnosis: Unsteadiness on feet (R26.81);Other abnormalities of gait and mobility (R26.89);Muscle weakness (generalized) (M62.81);Pain   Activity Tolerance Patient tolerated treatment well   Patient Left in bed;with call bell/phone within reach;with bed alarm set   Nurse Communication Mobility status        Time: 0102-7253 OT Time Calculation (min): 33 min  Charges: OT General Charges $OT Visit: 1 Visit OT Treatments $Self Care/Home Management : 23-37 mins  [place] Pager [phone] Office [place] 02/21/2021, 2:04 PM

## 2021-02-21 NOTE — Progress Notes (Signed)
Nutrition Follow-up  DOCUMENTATION CODES:   Severe malnutrition in context of chronic illness  INTERVENTION:   Encourage good PO intake  Increase Ensure Enlive po TID, each supplement provides 350 kcal and 20 grams of protein Continue Magic Cup TID with meals, each supplement provides 290 kcal and 9 grams of protein Continue MVI with minerals daily Continue full feeding assist  NUTRITION DIAGNOSIS:   Severe Malnutrition related to chronic illness (dementia) as evidenced by severe muscle depletion, severe fat depletion, percent weight loss (10.8% weight loss in 3 months). - Ongoing  GOAL:   Patient will meet greater than or equal to 90% of their needs - Progressing   MONITOR:   PO intake, Supplement acceptance, Labs, Skin  REASON FOR ASSESSMENT:   Consult Assessment of nutrition requirement/status, Poor PO, Wound healing  ASSESSMENT:   87 year old female who presented to the ED on 12/10 with declining mental status x 1 week. PMH of dementia, atrial fibrillation, GERD, HTN, PVD, HLD, OSA. Pt found to have bilateral pulmonary embolism, AKI, shock.  12/11 - s/p pulmonary arteriography, catheter-directed pulmonary thrombectomy, diet advanced to Heart Healthy 12/13 - transferred to floor  Per EMR, pt intake includes: 12/15: Breakfast 5%, Lunch 0%, Dinner 10% 12/16: Breakfast 5%, Lunch 0%, Dinner 0% 12/19: Breakfast 5%  Pt with continue poor PO intake; pt reports that her intake is improving. Pt reports that she likes the Ensure's and that she is drinking them.   Pt lunch tray in room, RD set pt tray up and informed RN  that pt was starting to eat and needs full assist.   Medications reviewed and include: Ferrous Sulfate, MVI, Protonix, Phosphorus, Potassium Chloride Labs reviewed: Potassium 3.1   Diet Order:   Diet Order             Diet regular Room service appropriate? Yes; Fluid consistency: Thin  Diet effective now                   EDUCATION NEEDS:    Not appropriate for education at this time  Skin:  Skin Assessment: Skin Integrity Issues:: Stage II: sacrum, R buttocks Incisions: groin  Last BM:  02/18/2021  Height:   Ht Readings from Last 1 Encounters:  10/12/20 4\' 11"  (1.499 m)    Weight:   Wt Readings from Last 1 Encounters:  02/20/21 56.7 kg    BMI:  Body mass index is 25.25 kg/m.  Estimated Nutritional Needs:   Kcal:  1600-1800  Protein:  95-110 grams  Fluid:  >/= 1.6 L   Noe Goyer Louie Casa, RD, LDN Clinical Dietitian See [facility] for contact information.

## 2021-02-21 NOTE — Progress Notes (Signed)
PROGRESS NOTE    [name] [name]  GEX:[id number] DOB: November 06, 1931 DOA: 02/12/2021 PCP: Denita Lung, MD   Brief Narrative: 87 year old past medical history significant for A. fib not on anticoagulation, GERD, hypertension, PVD, OSA, dementia who has had since 12/6 mental decline at home, poor oral intake.  Patient presented lethargic, febrile on arrival temperature 104, leukocytosis, CTA chest was obtained which was concerning for PE and right and left pulmonary artery, lobar arterial and multiple bilateral segmental and subsegmental PE.  Concern for RV strain.  She became hypotensive in the ED.  Received IV fluid and started on Levophed.  Started on broad-spectrum antibiotics.  CCM admitted patient.  Hospital Course:  12/10 Admit, large BL PE, +UA, Vanc cefepime, to IR for thrombectomy Underwent thrombectomy under MAC 12/11 U/S BLE 12/11 > +left femoral, popliteal, posterior tibial vein DVT  TTE 12/11 >> LV normal fxn, Grade 1 diastolic dysfunction, RV function moderately reduced, RV size moderately enlarged, pericardial effusion is posterior to the left ventricle, MV is abnormal with regurgitation , no stenosis, Aortic valve tricuspid with mild calcification and mild regurgitation , sclerosis/calcification is present, without any evidence of aortic  stenosis.  Inferior vena cava is normal size, suggesting RAP of 3 mm Hg.    Assessment & Plan:   Principal Problem:   Pulmonary embolism (HCC) Active Problems:   Pressure injury of skin   Protein-calorie malnutrition, severe  1-Acute metabolic encephalopathy in the setting of sepsis shock and UTI improving; Dementia: Continue with Namenda Stable. Pleasantly confuse.   2-Shock multifactorial secondary to PE and sepsis secondary to UTI. -Patient was weaned off of Levophed. -Blood cultures: No growth to date.  -Urine culture, not obtained. -Completed antibiotics: 4 days Ceftriaxone.  -BP stable.   3-Submassive PE, lower extremity  DVT: Status post thrombectomy by IR 12/11. She was initially treated with heparin drip, now transition to [employer].  Stable.   A. fib, hypertension, PVD: Aspirin on hold Continue with statins.  Beta Blocker on hold due to hypotension on admission  Hypokalemia: Hypokalemia : will give Kcl times 2.  Mg at 1.8. replaced. Check labs tomorrow.  Hypophosphatemia; replaced.  Repeat labs tomorrow.   Anemia of critical illness: Hb stable.  B 12 elevated.   Pressure injury sacral stage II POA Buttock stage II POA Wound care consulted.   AKI: Cr peak to 1.3 Improved with support.   Severe Malnutrition Nutrition consulted. On ensure  GERD: Continue with PPI  Feet pain: Continue with Tylenol. Elevation of troponin:  Related to PE>    Pressure Injury 02/13/21 Sacrum Mid Stage 2 -  Partial thickness loss of dermis presenting as a shallow open injury with a red, pink wound bed without slough. pink (Active)  02/13/21 0200  Location: Sacrum  Location Orientation: Mid  Staging: Stage 2 -  Partial thickness loss of dermis presenting as a shallow open injury with a red, pink wound bed without slough.  Wound Description (Comments): pink  Present on Admission: Yes     Pressure Injury 02/13/21 Buttocks Right Stage 2 -  Partial thickness loss of dermis presenting as a shallow open injury with a red, pink wound bed without slough. pink (Active)  02/13/21 0200  Location: Buttocks  Location Orientation: Right  Staging: Stage 2 -  Partial thickness loss of dermis presenting as a shallow open injury with a red, pink wound bed without slough.  Wound Description (Comments): pink  Present on Admission: Yes     Nutrition Problem:  Severe Malnutrition Etiology: chronic illness (dementia)    Signs/Symptoms: severe muscle depletion, severe fat depletion, percent weight loss (10.8% weight loss in 3 months) Percent weight loss: 10.8 %    Interventions: Ensure Enlive  (each supplement provides 350kcal and 20 grams of protein), Magic cup, MVI  Estimated body mass index is 25.25 kg/m as calculated from the following:   Height as of 10/12/20: _0  (1.499 m).   Weight as of this encounter: 56.7 kg.   DVT prophylaxis: eliquis Code Status: Full code Family Communication: Daughter Updated 12/16---12/18 Disposition Plan:  Status is: Inpatient  Remains inpatient appropriate because: PE, shock.   Disposition: discharge when bed available.       Consultants:  CCM   Procedures:  Thrombectomy   Antimicrobials:    Subjective: Alert, confuse. I encourage her to eat   Objective: Vitals:   02/20/21 0814 02/20/21 1551 02/20/21 2034 02/21/21 0807  BP: 117/63 124/73 111/74 113/63  Pulse: 73 81 77 75  Resp: _1 Temp: 98.2 F (36.8 C) 98.4 F (36.9 C) 98.2 F (36.8 C) 98.3 F (36.8 C)  TempSrc:  Oral Oral Oral  SpO2: 100% 97% 100% 100%  Weight:        Intake/Output Summary (Last 24 hours) at 02/21/2021 1556 Last data filed at 02/21/2021 1446 Gross per 24 hour  Intake 360 ml  Output --  Net 360 ml    Filed Weights   02/16/21 0415 02/19/21 0511 02/20/21 0451  Weight: 56.9 kg 57.1 kg 56.7 kg    Examination:  General exam: NAD Respiratory system: CTA Cardiovascular system:  S 1 , S 2 RRR Gastrointestinal system: BS present, soft, nt Central nervous system: Alert, follows command.  Extremities: no edema    Data Reviewed: I have personally reviewed following labs and imaging studies  CBC: Recent Labs  Lab 02/15/21 0505 02/16/21 0515 02/18/21 0800 02/19/21 1044  WBC 11.4* 9.7 8.0 6.6  NEUTROABS  --  7.4  --   --   HGB 9.0* 9.2* 8.2* 9.3*  HCT 26.6* 27.1* 24.6* 27.5*  MCV 97.1 97.5 99.2 98.9  PLT 135* 152 214 196    Basic Metabolic Panel: Recent Labs  Lab 02/15/21 0505 02/16/21 0515 02/17/21 0227 02/18/21 0152 02/19/21 1044 02/21/21 0109  NA 132* 133* 133* 132* 134* 135  K 3.0* 3.1* 3.4* 4.2 3.6 3.1*   CL 104 104 103 104 105 104  CO2 21* 24 25 21* 23 23  GLUCOSE 104* 94 92 89 94 106*  BUN 15 9 7* 8 6* 8  CREATININE 0.62 0.57 0.54 0.52 0.48 0.57  CALCIUM 8.4* 8.4* 8.3* 8.4* 8.2* 8.3*  MG 2.1 1.8 2.0 1.8 1.8  --   PHOS 1.9* 2.0* 2.1* 2.0* 2.5  --     GFR: Estimated Creatinine Clearance: 38 mL/min (by C-G formula based on SCr of 0.57 mg/dL). Liver Function Tests: No results for input(s): AST, ALT, ALKPHOS, BILITOT, PROT, ALBUMIN in the last 168 hours.  No results for input(s): LIPASE, AMYLASE in the last 168 hours.  No results for input(s): AMMONIA in the last 168 hours. Coagulation Profile: No results for input(s): INR, PROTIME in the last 168 hours.  Cardiac Enzymes: No results for input(s): CKTOTAL, CKMB, CKMBINDEX, TROPONINI in the last 168 hours. BNP (last 3 results) No results for input(s): PROBNP in the last 8760 hours. HbA1C: No results for input(s): HGBA1C in the last 72 hours. CBG: No results for input(s): GLUCAP in the last 168  hours.  Lipid Profile: No results for input(s): CHOL, HDL, LDLCALC, TRIG, CHOLHDL, LDLDIRECT in the last 72 hours. Thyroid Function Tests: No results for input(s): TSH, T4TOTAL, FREET4, T3FREE, THYROIDAB in the last 72 hours. Anemia Panel: No results for input(s): VITAMINB12, FOLATE, FERRITIN, TIBC, IRON, RETICCTPCT in the last 72 hours.  Sepsis Labs: Recent Labs  Lab 02/15/21 0505  PROCALCITON <0.10     Recent Results (from the past 240 hour(s))  Culture, blood (Routine x 2)     Status: None   Collection Time: 02/12/21  3:56 PM   Specimen: BLOOD RIGHT FOREARM  Result Value Ref Range Status   Specimen Description BLOOD RIGHT FOREARM  Final   Special Requests   Final    BOTTLES DRAWN AEROBIC AND ANAEROBIC Blood Culture results may not be optimal due to an inadequate volume of blood received in culture bottles   Culture   Final    NO GROWTH 5 DAYS Performed at [facility], [place] [address] [place], [place] [address]     Report Status 02/17/2021 FINAL  Final  Resp Panel by RT-PCR (Flu A&B, Covid)     Status: None   Collection Time: 02/12/21  3:56 PM   Specimen: Nasopharyngeal(NP) swabs in vial transport medium  Result Value Ref Range Status   SARS Coronavirus 2 by RT PCR NEGATIVE NEGATIVE Final    Comment: (NOTE) SARS-CoV-2 target nucleic acids are NOT DETECTED.  The SARS-CoV-2 RNA is generally detectable in upper respiratory specimens during the acute phase of infection. The lowest concentration of SARS-CoV-2 viral copies this assay can detect is 138 copies/mL. A negative result does not preclude SARS-Cov-2 infection and should not be used as the sole basis for treatment or other patient management decisions. A negative result may occur with  improper specimen collection/handling, submission of specimen other than nasopharyngeal swab, presence of viral mutation(s) within the areas targeted by this assay, and inadequate number of viral copies(<138 copies/mL). A negative result must be combined with clinical observations, patient history, and epidemiological information. The expected result is Negative.  Fact Sheet for Patients:  EntrepreneurPulse.com.au  Fact Sheet for Healthcare Providers:  IncredibleEmployment.be  This test is no t yet approved or cleared by the [place] FDA and  has been authorized for detection and/or diagnosis of SARS-CoV-2 by FDA under an Emergency Use Authorization (EUA). This EUA will remain  in effect (meaning this test can be used) for the duration of the COVID-19 declaration under Section 564(b)(1) of the Act, 21 U.S.C.section 360bbb-3(b)(1), unless the authorization is terminated  or revoked sooner.       Influenza A by PCR NEGATIVE NEGATIVE Final   Influenza B by PCR NEGATIVE NEGATIVE Final    Comment: (NOTE) The Xpert Xpress SARS-CoV-2/FLU/RSV plus assay is intended as an aid in the diagnosis of influenza from  Nasopharyngeal swab specimens and should not be used as a sole basis for treatment. Nasal washings and aspirates are unacceptable for Xpert Xpress SARS-CoV-2/FLU/RSV testing.  Fact Sheet for Patients: EntrepreneurPulse.com.au  Fact Sheet for Healthcare Providers: IncredibleEmployment.be  This test is not yet approved or cleared by the [place] FDA and has been authorized for detection and/or diagnosis of SARS-CoV-2 by FDA under an Emergency Use Authorization (EUA). This EUA will remain in effect (meaning this test can be used) for the duration of the COVID-19 declaration under Section 564(b)(1) of the Act, 21 U.S.C. section 360bbb-3(b)(1), unless the authorization is terminated or revoked.  Performed at [facility],  [address], [place], [place] [address]   Culture, blood (Routine x 2)     Status: None   Collection Time: 02/12/21  5:25 PM   Specimen: Left Antecubital; Blood  Result Value Ref Range Status   Specimen Description LEFT ANTECUBITAL  Final   Special Requests   Final    BOTTLES DRAWN AEROBIC AND ANAEROBIC Blood Culture adequate volume   Culture   Final    NO GROWTH 5 DAYS Performed at [facility], [place] [address] [place], [place] [address]    Report Status 02/17/2021 FINAL  Final  MRSA Next Gen by PCR, Nasal     Status: None   Collection Time: 02/13/21  7:12 AM   Specimen: Nasal Mucosa; Nasal Swab  Result Value Ref Range Status   MRSA by PCR Next Gen NOT DETECTED NOT DETECTED Final    Comment: (NOTE) The GeneXpert MRSA Assay (FDA approved for NASAL specimens only), is one component of a comprehensive MRSA colonization surveillance program. It is not intended to diagnose MRSA infection nor to guide or monitor treatment for MRSA infections. Test performance is not FDA approved in patients less than 78 years old. Performed at [facility], [place] [address], [place], [place] [address]            Radiology Studies: No results found.      Scheduled Meds:  apixaban  10 mg Oral BID   Followed by   Derrill Memo ON 02/22/2021] apixaban  5 mg Oral BID   feeding supplement  237 mL Oral TID BM   ferrous sulfate  325 mg Oral Q breakfast   mouth rinse  15 mL Mouth Rinse BID   memantine  14 mg Oral Daily   multivitamin with minerals  1 tablet Oral Daily   pantoprazole  40 mg Oral Daily   phosphorus  500 mg Oral BID   Continuous Infusions:  sodium chloride Stopped (02/15/21 1420)   sodium chloride 35 mL/hr at 02/21/21 0603     LOS: 9 days    Time spent: 35 minutes.     Elmarie Shiley, MD [facility]   If 7PM-7AM, please contact night-coverage [email or web address]  02/21/2021, 3:56 PM

## 2021-02-22 LAB — BASIC METABOLIC PANEL
Anion gap: 8 (ref 5–15)
BUN: 7 mg/dL — ABNORMAL LOW (ref 8–23)
CO2: 21 mmol/L — ABNORMAL LOW (ref 22–32)
Calcium: 8.4 mg/dL — ABNORMAL LOW (ref 8.9–10.3)
Chloride: 108 mmol/L (ref 98–111)
Creatinine, Ser: 0.58 mg/dL (ref 0.44–1.00)
GFR, Estimated: >60 mL/min (ref >60)
Glucose, Bld: 94 mg/dL (ref 70–99)
Potassium: 3.4 mmol/L — ABNORMAL LOW (ref 3.5–5.1)
Sodium: 137 mmol/L (ref 135–145)

## 2021-02-22 LAB — PHOSPHORUS: Phosphorus: 1.8 mg/dL — ABNORMAL LOW (ref 2.5–4.6)

## 2021-02-22 LAB — CBC
HCT: 31.6 % — ABNORMAL LOW (ref 36.0–46.0)
Hemoglobin: 10.6 g/dL — ABNORMAL LOW (ref 12.0–15.0)
MCH: 33.7 pg (ref 26.0–34.0)
MCHC: 33.5 g/dL (ref 30.0–36.0)
MCV: 100.3 fL — ABNORMAL HIGH (ref 80.0–100.0)
Platelets: 275 10*3/uL (ref 150–400)
RBC: 3.15 MIL/uL — ABNORMAL LOW (ref 3.87–5.11)
RDW: 13.4 % (ref 11.5–15.5)
WBC: 4.5 10*3/uL (ref 4.0–10.5)
nRBC: 0 % (ref 0.0–0.2)

## 2021-02-22 LAB — MAGNESIUM: Magnesium: 1.6 mg/dL — ABNORMAL LOW (ref 1.7–2.4)

## 2021-02-22 LAB — SARS CORONAVIRUS 2 (TAT 6-24 HRS): SARS Coronavirus 2: NEGATIVE

## 2021-02-22 MED ORDER — K PHOS MONO-SOD PHOS DI & MONO 155-852-130 MG PO TABS: 500.0000 mg | ORAL_TABLET | Freq: Two times a day (BID) | ORAL | 0 refills | Status: AC

## 2021-02-22 MED ORDER — MAGNESIUM SULFATE 2 GM/50ML IV SOLN
2.0000 g | Freq: Once | INTRAVENOUS | Status: AC
  Administered 2021-02-22: 09:00:00 2 g via INTRAVENOUS
  Filled 2021-02-22: qty 50

## 2021-02-22 MED ORDER — FERROUS SULFATE 325 (65 FE) MG PO TABS: 325.0000 mg | ORAL_TABLET | Freq: Every day | ORAL | 3 refills | Status: AC

## 2021-02-22 MED ORDER — ENSURE ENLIVE PO LIQD: 237.0000 mL | Freq: Three times a day (TID) | ORAL | 12 refills | Status: DC

## 2021-02-22 MED ORDER — POTASSIUM CHLORIDE CRYS ER 20 MEQ PO TBCR
40.0000 meq | EXTENDED_RELEASE_TABLET | Freq: Once | ORAL | Status: AC
  Administered 2021-02-22: 08:00:00 40 meq via ORAL
  Filled 2021-02-22: qty 2

## 2021-02-22 MED ORDER — APIXABAN 5 MG PO TABS
5.0000 mg | ORAL_TABLET | Freq: Two times a day (BID) | ORAL | 3 refills | Status: AC
Start: 2021-02-22 — End: ?

## 2021-02-22 MED ORDER — MAGNESIUM OXIDE -MG SUPPLEMENT 400 (240 MG) MG PO TABS
400.0000 mg | ORAL_TABLET | Freq: Two times a day (BID) | ORAL | Status: DC
  Administered 2021-02-22 – 2021-02-23 (×2): 400 mg via ORAL
  Filled 2021-02-22 (×2): qty 1

## 2021-02-22 MED ORDER — POTASSIUM PHOSPHATES 15 MMOLE/5ML IV SOLN
30.0000 mmol | Freq: Once | INTRAVENOUS | Status: AC
  Administered 2021-02-22: 08:00:00 30 mmol via INTRAVENOUS
  Filled 2021-02-22: qty 10

## 2021-02-22 MED ORDER — DOCUSATE SODIUM 100 MG PO CAPS: 100.0000 mg | ORAL_CAPSULE | Freq: Two times a day (BID) | ORAL | 0 refills | Status: AC | PRN

## 2021-02-22 MED ORDER — PANTOPRAZOLE SODIUM 40 MG PO TBEC
40.0000 mg | DELAYED_RELEASE_TABLET | Freq: Every day | ORAL | 0 refills | Status: AC
Start: 2021-02-23 — End: ?

## 2021-02-22 MED ORDER — MAGNESIUM OXIDE -MG SUPPLEMENT 400 (240 MG) MG PO TABS: 400.0000 mg | ORAL_TABLET | Freq: Two times a day (BID) | ORAL | 0 refills | Status: AC

## 2021-02-22 NOTE — TOC Progression Note (Addendum)
Transition of Care [facility]) - Progression Note    Patient Details  Name: [name] [name] MRN: [id number] Date of Birth: 30-May-1931  [facility] CM/SW Contact  Emeterio Reeve, [place] Phone Number: 02/22/2021, 10:48 AM  Clinical Narrative:     Pt has no bed offers. CSW reached out to facilities to review pt. CSW attempted to call pts daughter. There was no answer, CSW LVM.  1:10pm- CSW spoke to pts daughter on phone. Daughter requested Csw test bed offers. Daughter will test reply back to CSW.  3:20pm- Pts daughter [name] accepted a bed at [facility]. [place] will start insurance auth.   Expected Discharge Plan: [place] Barriers to Discharge: Continued Medical Work up, Ship broker, No SNF bed  Expected Discharge Plan and Services Expected Discharge Plan: [place] arrangements for the past 2 months: Single Family Home                                       Social Determinants of Health (SDOH) Interventions    Readmission Risk Interventions No flowsheet data found.  Emeterio Reeve, LCSW Clinical Social Worker

## 2021-02-22 NOTE — Discharge Summary (Signed)
Physician Discharge Summary  [name] [name] ENI:[id number] DOB: November 29, 1931 DOA: 02/12/2021  PCP: Denita Lung, MD  Admit date: 02/12/2021 Discharge date: 02/23/2021  Admitted From: Home  Disposition:  SNF  Recommendations for Outpatient Follow-up:  Follow up with PCP in 1-2 weeks Please obtain BMP/CBC/ phosphorus, Mg in one week    Discharge Condition: Stable.  CODE STATUS:Full Code Diet recommendation: Heart Healthy   Brief/Interim Summary: 87 year old past medical history significant for A. fib not on anticoagulation, GERD, hypertension, PVD, OSA, dementia who has had since 12/6 mental decline at home, poor oral intake.  Patient presented lethargic, febrile on arrival temperature 104, leukocytosis, CTA chest was obtained which was concerning for PE and right and left pulmonary artery, lobar arterial and multiple bilateral segmental and subsegmental PE.  Concern for RV strain.  She became hypotensive in the ED.  Received IV fluid and started on Levophed.  Started on broad-spectrum antibiotics.  CCM admitted patient.   Hospital Course:  12/10 Admit, large BL PE, +UA, Vanc cefepime, to IR for thrombectomy Underwent thrombectomy under MAC 12/11 U/S BLE 12/11 > +left femoral, popliteal, posterior tibial vein DVT  TTE 12/11 >> LV normal fxn, Grade 1 diastolic dysfunction, RV function moderately reduced, RV size moderately enlarged, pericardial effusion is posterior to the left ventricle, MV is abnormal with regurgitation , no stenosis, Aortic valve tricuspid with mild calcification and mild regurgitation , sclerosis/calcification is present, without any evidence of aortic  stenosis.  Inferior vena cava is normal size, suggesting RAP of 3 mm Hg.      1-Acute metabolic encephalopathy in the setting of sepsis shock and UTI improving; Dementia: Continue with Namenda Stable. Pleasantly confuse.    2-Shock multifactorial secondary to PE and sepsis secondary to UTI. -Patient was weaned off  of Levophed. -Blood cultures: No growth to date.  -Urine culture, not obtained. -Completed antibiotics: 4 days Ceftriaxone.  -BP stable.    3-Submassive PE, lower extremity DVT: Status post thrombectomy by IR 12/11. She was initially treated with heparin drip, now transition to [employer].  Stable. Completed 10 mg BID for 7 days. Now on eliquis 5 Mg BID.    A. fib, hypertension, PVD: Aspirin on hold Continue with statins.  Beta Blocker on hold due to hypotension on admission   Hypokalemia: Hypokalemia : replete orally.  Hypomagnesemia; replete IV. Added oral supplementation. Continue at discharge.  Hypophosphatemia; replaced IV. On oral supplement.  Repeat labs tomorrow.    Anemia of critical illness: Hb stable.  B 12 elevated.    Pressure injury sacral stage II POA Buttock stage II POA Wound care consulted.    AKI: Cr peak to 1.3 Improved with support.    Severe Malnutrition Nutrition consulted. On ensure   GERD: Continue with PPI   Feet pain: Continue with Tylenol. Elevation of troponin:  Related to PE>    Plan for discharge 12/21 if stable and bed available.      Pressure Injury 02/13/21 Sacrum Mid Stage 2 -  Partial thickness loss of dermis presenting as a shallow open injury with a red, pink wound bed without slough. pink (Active)  02/13/21 0200  Location: Sacrum  Location Orientation: Mid  Staging: Stage 2 -  Partial thickness loss of dermis presenting as a shallow open injury with a red, pink wound bed without slough.  Wound Description (Comments): pink  Present on Admission: Yes     Pressure Injury 02/13/21 Buttocks Right Stage 2 -  Partial thickness loss of dermis presenting  as a shallow open injury with a red, pink wound bed without slough. pink (Active)  02/13/21 0200  Location: Buttocks  Location Orientation: Right  Staging: Stage 2 -  Partial thickness loss of dermis presenting as a shallow open injury with a red, pink wound bed  without slough.  Wound Description (Comments): pink  Present on Admission: Yes        Nutrition Problem: Severe Malnutrition Etiology: chronic illness (dementia)       Signs/Symptoms: severe muscle depletion, severe fat depletion, percent weight loss (10.8% weight loss in 3 months) Percent weight loss: 10.8 %       Interventions: Ensure Enlive (each supplement provides 350kcal and 20 grams of protein), Magic cup, MVI    Discharge Diagnoses:  Principal Problem:   Pulmonary embolism ([place]) Active Problems:   Pressure injury of skin   Protein-calorie malnutrition, severe    Discharge Instructions   Allergies as of 02/22/2021   No Known Allergies      Medication List     STOP taking these medications    aspirin EC 81 MG tablet   atenolol-chlorthalidone 100-25 MG tablet Commonly known as: TENORETIC   ELDERBERRY PO   spironolactone 25 MG tablet Commonly known as: ALDACTONE       TAKE these medications    acetaminophen 650 MG CR tablet Commonly known as: TYLENOL Take 650-1,300 mg by mouth every 8 (eight) hours as needed (arthritis pain).   apixaban 5 MG Tabs tablet Commonly known as: ELIQUIS Take 1 tablet (5 mg total) by mouth 2 (two) times daily.   docusate sodium 100 MG capsule Commonly known as: COLACE Take 1 capsule (100 mg total) by mouth 2 (two) times daily as needed for mild constipation.   feeding supplement Liqd Take 237 mLs by mouth 3 (three) times daily between meals.   ferrous sulfate 325 (65 FE) MG tablet Take 1 tablet (325 mg total) by mouth daily with breakfast. Start taking on: February 23, 2021   magnesium oxide 400 (240 Mg) MG tablet Commonly known as: MAG-OX Take 1 tablet (400 mg total) by mouth 2 (two) times daily.   memantine 14 MG Cp24 24 hr capsule Commonly known as: NAMENDA XR Take 1 capsule (14 mg total) by mouth daily.   multivitamin with minerals Tabs tablet Take 1 tablet by mouth daily.   pantoprazole 40 MG  tablet Commonly known as: PROTONIX Take 1 tablet (40 mg total) by mouth daily. Start taking on: February 23, 2021   phosphorus [id number] MG tablet Commonly known as: K PHOS NEUTRAL Take 2 tablets (500 mg total) by mouth 2 (two) times daily.   simvastatin 40 MG tablet Commonly known as: ZOCOR Take 1 tablet (40 mg total) by mouth at bedtime.   triamcinolone cream 0.5 % Commonly known as: KENALOG APPLY TOPICALLY 3 TIMES  DAILY What changed:  how much to take how to take this when to take this reasons to take this   VITAMIN B-6 PO Take 1 tablet by mouth daily.               Durable Medical Equipment  (From admission, onward)           Start     Ordered   02/18/21 1027  For home use only DME standard manual wheelchair with seat cushion  Once       Comments: Patient suffers from Acute metabolic encephalopathy in the setting of sepsis shock and UTI  which impairs their ability to perform  daily activities like ambulating  in the home.  A cane  will not resolve issue with performing activities of daily living. A wheelchair will allow patient to safely perform daily activities. Patient can safely propel the wheelchair in the home or has a caregiver who can provide assistance. Length of need lifetime . Accessories: elevating leg rests (ELRs), wheel locks, extensions and anti-tippers.   Seat and back cushions   02/18/21 1027   02/18/21 1026  For home use only DME Hospital bed  Once       Comments: Call daughter Irena Reichmann [phone] for delivery thanks  Question Answer Comment  Length of Need Lifetime   Patient has (list medical condition): Acute metabolic encephalopathy in the setting of sepsis shock and UTI   The above medical condition requires: Patient requires the ability to reposition frequently   Head must be elevated greater than: 45 degrees   Bed type Semi-electric   Hoyer Lift Yes   Support Surface: Gel Overlay      02/18/21 1027             Follow-up Information     Denita Lung, MD Follow up in 1 week(s).   Specialty: Family Medicine Contact information: [address] [place] [address] [phone]                No Known Allergies  Consultations: CCM admitted patient.    Procedures/Studies: IR THROMBECT PRIM MECH INIT (INCLU) MOD SED  Result Date: 02/13/2021 INDICATION: Briefly, 87 year old female comorbid with history of Alzheimer disease who presented with increased altered mental status. CTA revealing bilateral pulmonary emboli with marked RIGHT heart strain. Hypotensive. RV/LV ratio 2.3. sPESI 4, HIGH risk. EXAM: Procedures; 1. ULTRASOUND GUIDANCE FOR VENOUS ACCESS 2. PULMONARY ARTERIOGRAPHY 3. FLUOROSCOPIC GUIDED CATHETER DIRECTED BILATERAL PULMONARY THROMBECTOMY Performing Physician: Michaelle Birks, MDAssistant(s): Miachel Roux, MD A qualified trainee/resident or advanced practice provider (APP) was not immediately available to assist with this case. COMPARISON:  CTA CAP, 02/12/2021. MEDICATIONS: None ANESTHESIA/SEDATION: Sedation by the Anesthesia Team was performed. For details, please see anesthesia record. CONTRAST:  55m OMNIPAQUE IOHEXOL 300 MG/ML  SOLN FLUOROSCOPY TIME:  35 minutes, 8 seconds (1425.9mGy) COMPLICATIONS: None immediate. TECHNIQUE: Informed written consent was obtained from the the patient and/or patient's representative after a discussion of the risks, benefits and alternatives to treatment. Questions regarding the procedure were encouraged and answered. A timeout was performed prior to the initiation of the procedure. Ultrasound scanning was performed of the right groin and demonstrated wide patency of the right common femoral vein. The right groin was prepped and draped in the usual sterile fashion, and a sterile drape was applied covering the operative field. Maximum barrier sterile technique with sterile gowns and gloves were used for the procedure. A timeout was performed prior  to the initiation of the procedure. Local anesthesia was provided with 1% lidocaine. Under direct ultrasound guidance, the RIGHT common femoral vein was accessed with a micro puncture kit ultimately allowing placement of a 6 Fr vascular sheath. Ultrasound and fluoroscopic spot images were saved for procedural documentation purposes. With the use of a 0.035 inch Bentson wire and an angled pigtail pulmonary catheter, access past the RIGHT heart and into the main pulmonary artery was obtained. Pre intervention pulmonary arterial pressures were obtained from the main pulmonary artery, then a limited central pulmonary arteriogram was performed. The Bentson wire was exchanged for an Amplatz wire then a 12 Fr, 33 cm Gore dry seal sheath then  a 12 Fr Penumbra Lightning aspiration catheter was advanced into the LEFT than RIGHT pulmonary arteries and catheter directed thrombectomy was performed. Intermittent and postprocedural LEFT than RIGHT pulmonary arteriograms were performed confirming thrombus removal. Post interventional pulmonary arterial pressures were obtained from the main pulmonary artery. The catheters and sheath were then removed, and hemostasis was obtained by manual pressure. The patient tolerated the procedure well without immediate postprocedural complication. FINDINGS: Central pulmonary arteriogram demonstrates large burden bilateral pulmonary emboli, partially-obstructing the LEFT and RIGHT pulmonary arterial bifurcations. Acquired pressure measurements: Pre: Main PA 26/14; mean-18 (normal: < 25/10) Post: Main PA 24/4; mean-14 (normal: < 25/10) Post intervention pulmonary arteriograms demonstrating adequate thrombectomy from the RIGHT pulmonary arterial bifurcation. Residual adherent thrombus was present at the LEFT pulmonary arterial bifurcation. IMPRESSION: 1. Pulmonary arteriogram revealing large burden bilateral pulmonary emboli, partially obstructing the LEFT and RIGHT pulmonary arterial bifurcations.  2. Elevated pressures within the main PA compatible with critical pulmonary arterial hypertension. 3. Successful catheter directed bilateral pulmonary thrombectomy with adequate removal from the RIGHT pulmonary arterial bifurcation. Partial removal with residual adherent thrombus at the LEFT pulmonary arterial bifurcation. PLAN: Continue systemic anticoagulation with heparin gtt. Management per pulmonary and critical care team. Consider bilateral lower extremities Doppler US to evaluate for DVT. Procedural results were discussed at the time of completion to provider Hillery Aldo , who verbally acknowledged these results. Michaelle Birks, MD [facility] Radiology Electronically Signed   By: Michaelle Birks M.D.   On: 02/13/2021 09:52   IR US Guide Vasc Access Right  Result Date: 02/13/2021 INDICATION: Briefly, 87 year old female comorbid with history of Alzheimer disease who presented with increased altered mental status. CTA revealing bilateral pulmonary emboli with marked RIGHT heart strain. Hypotensive. RV/LV ratio 2.3. sPESI 4, HIGH risk. EXAM: Procedures; 1. ULTRASOUND GUIDANCE FOR VENOUS ACCESS 2. PULMONARY ARTERIOGRAPHY 3. FLUOROSCOPIC GUIDED CATHETER DIRECTED BILATERAL PULMONARY THROMBECTOMY Performing Physician: Michaelle Birks, MDAssistant(s): Miachel Roux, MD A qualified trainee/resident or advanced practice provider (APP) was not immediately available to assist with this case. COMPARISON:  CTA CAP, 02/12/2021. MEDICATIONS: None ANESTHESIA/SEDATION: Sedation by the Anesthesia Team was performed. For details, please see anesthesia record. CONTRAST:  67m OMNIPAQUE IOHEXOL 300 MG/ML  SOLN FLUOROSCOPY TIME:  35 minutes, 8 seconds (1323.5mGy) COMPLICATIONS: None immediate. TECHNIQUE: Informed written consent was obtained from the the patient and/or patient's representative after a discussion of the risks, benefits and alternatives to treatment. Questions regarding  the procedure were encouraged and answered. A timeout was performed prior to the initiation of the procedure. Ultrasound scanning was performed of the right groin and demonstrated wide patency of the right common femoral vein. The right groin was prepped and draped in the usual sterile fashion, and a sterile drape was applied covering the operative field. Maximum barrier sterile technique with sterile gowns and gloves were used for the procedure. A timeout was performed prior to the initiation of the procedure. Local anesthesia was provided with 1% lidocaine. Under direct ultrasound guidance, the RIGHT common femoral vein was accessed with a micro puncture kit ultimately allowing placement of a 6 Fr vascular sheath. Ultrasound and fluoroscopic spot images were saved for procedural documentation purposes. With the use of a 0.035 inch Bentson wire and an angled pigtail pulmonary catheter, access past the RIGHT heart and into the main pulmonary artery was obtained. Pre intervention pulmonary arterial pressures were obtained from the main pulmonary artery, then a limited central pulmonary arteriogram was performed. The Bentson wire was exchanged for an  Amplatz wire then a 12 Fr, 33 cm Gore dry seal sheath then a 12 Fr Penumbra Lightning aspiration catheter was advanced into the LEFT than RIGHT pulmonary arteries and catheter directed thrombectomy was performed. Intermittent and postprocedural LEFT than RIGHT pulmonary arteriograms were performed confirming thrombus removal. Post interventional pulmonary arterial pressures were obtained from the main pulmonary artery. The catheters and sheath were then removed, and hemostasis was obtained by manual pressure. The patient tolerated the procedure well without immediate postprocedural complication. FINDINGS: Central pulmonary arteriogram demonstrates large burden bilateral pulmonary emboli, partially-obstructing the LEFT and RIGHT pulmonary arterial bifurcations. Acquired  pressure measurements: Pre: Main PA 26/14; mean-18 (normal: < 25/10) Post: Main PA 24/4; mean-14 (normal: < 25/10) Post intervention pulmonary arteriograms demonstrating adequate thrombectomy from the RIGHT pulmonary arterial bifurcation. Residual adherent thrombus was present at the LEFT pulmonary arterial bifurcation. IMPRESSION: 1. Pulmonary arteriogram revealing large burden bilateral pulmonary emboli, partially obstructing the LEFT and RIGHT pulmonary arterial bifurcations. 2. Elevated pressures within the main PA compatible with critical pulmonary arterial hypertension. 3. Successful catheter directed bilateral pulmonary thrombectomy with adequate removal from the RIGHT pulmonary arterial bifurcation. Partial removal with residual adherent thrombus at the LEFT pulmonary arterial bifurcation. PLAN: Continue systemic anticoagulation with heparin gtt. Management per pulmonary and critical care team. Consider bilateral lower extremities Doppler US to evaluate for DVT. Procedural results were discussed at the time of completion to provider Hillery Aldo , who verbally acknowledged these results. Michaelle Birks, MD [facility] Radiology Electronically Signed   By: Michaelle Birks M.D.   On: 02/13/2021 09:52   DG Chest Port 1 View  Result Date: 02/12/2021 CLINICAL DATA:  Sepsis EXAM: PORTABLE CHEST 1 VIEW COMPARISON:  None. FINDINGS: Normal cardiac silhouette. Widened upper mediastinum suggest ectatic aorta. Lungs are clear. No effusion, infiltrate pneumothorax. No acute osseous abnormality. IMPRESSION: No clear acute cardiopulmonary findings. Widened mediastinum favored ectatic aorta. Electronically Signed   By: Suzy Bouchard M.D.   On: 02/12/2021 16:36   ECHOCARDIOGRAM COMPLETE  Result Date: 02/14/2021    ECHOCARDIOGRAM REPORT   Patient Name:   LEEONA MCCARDLE Date of Exam: 02/14/2021 Medical Rec #:  [id number]     Height:       59.0 in Accession #:     [id number]    Weight:       118.6 lb Date of Birth:  [birth year]     BSA:          1.477 m Patient Age:    38 years      BP:           94/58 mmHg Patient Gender: F             HR:           85 bpm. Exam Location:  Inpatient Procedure: 2D Echo, Cardiac Doppler and Color Doppler Indications:    Pulmonary Embolus I26.09  History:        Patient has no prior history of Echocardiogram examinations.                 Arrythmias:Atrial Fibrillation; Risk Factors:Dyslipidemia and                 Hypertension.  Sonographer:    Bernadene Person RDCS Referring Phys: [id number] [place]  1. Left ventricular ejection fraction, by estimation, is 60 to 65%. The left ventricle has normal function. The left ventricle has no regional wall motion abnormalities. Left ventricular diastolic parameters are  consistent with Grade I diastolic dysfunction (impaired relaxation).  2. Right ventricular systolic function is moderately reduced. The right ventricular size is moderately enlarged.  3. The pericardial effusion is posterior to the left ventricle.  4. The mitral valve is abnormal. Mild mitral valve regurgitation. No evidence of mitral stenosis.  5. The aortic valve is tricuspid. There is mild calcification of the aortic valve. Aortic valve regurgitation is mild. Aortic valve sclerosis/calcification is present, without any evidence of aortic stenosis.  6. The inferior vena cava is normal in size with greater than 50% respiratory variability, suggesting right atrial pressure of 3 mmHg. FINDINGS  Left Ventricle: Left ventricular ejection fraction, by estimation, is 60 to 65%. The left ventricle has normal function. The left ventricle has no regional wall motion abnormalities. The left ventricular internal cavity size was normal in size. There is  no left ventricular hypertrophy. Left ventricular diastolic parameters are consistent with Grade I diastolic dysfunction (impaired relaxation). Right Ventricle: The right ventricular  size is moderately enlarged. Right vetricular wall thickness was not assessed. Right ventricular systolic function is moderately reduced. Left Atrium: Left atrial size was normal in size. Right Atrium: Right atrial size was normal in size. Pericardium: Trivial pericardial effusion is present. The pericardial effusion is posterior to the left ventricle. Mitral Valve: The mitral valve is abnormal. There is mild thickening of the mitral valve leaflet(s). Mild mitral valve regurgitation. No evidence of mitral valve stenosis. Tricuspid Valve: The tricuspid valve is normal in structure. Tricuspid valve regurgitation is mild . No evidence of tricuspid stenosis. Aortic Valve: The aortic valve is tricuspid. There is mild calcification of the aortic valve. Aortic valve regurgitation is mild. Aortic regurgitation PHT measures 522 msec. Aortic valve sclerosis/calcification is present, without any evidence of aortic stenosis. Pulmonic Valve: The pulmonic valve was normal in structure. Pulmonic valve regurgitation is mild. No evidence of pulmonic stenosis. Aorta: The aortic root is normal in size and structure. Venous: The inferior vena cava is normal in size with greater than 50% respiratory variability, suggesting right atrial pressure of 3 mmHg. IAS/Shunts: No atrial level shunt detected by color flow Doppler.  LEFT VENTRICLE PLAX 2D LVIDd:         3.00 cm     Diastology LVIDs:         2.10 cm     LV e' medial:    4.24 cm/s LV PW:         0.90 cm     LV E/e' medial:  9.2 LV IVS:        1.10 cm     LV e' lateral:   7.51 cm/s LVOT diam:     2.10 cm     LV E/e' lateral: 5.2 LV SV:         61 LV SV Index:   41 LVOT Area:     3.46 cm  LV Volumes (MOD) LV vol d, MOD A2C: 43.6 ml LV vol d, MOD A4C: 73.6 ml LV vol s, MOD A2C: 22.3 ml LV vol s, MOD A4C: 29.1 ml LV SV MOD A2C:     21.3 ml LV SV MOD A4C:     73.6 ml LV SV MOD BP:      30.1 ml RIGHT VENTRICLE RV S prime:     14.00 cm/s TAPSE (M-mode): 2.0 cm LEFT ATRIUM             Index         RIGHT ATRIUM  Index LA diam:        2.70 cm 1.83 cm/m   RA Area:     13.60 cm LA Vol (A2C):   31.6 ml 21.39 ml/m  RA Volume:   28.10 ml  19.02 ml/m LA Vol (A4C):   31.4 ml 21.26 ml/m LA Biplane Vol: 34.0 ml 23.02 ml/m  AORTIC VALVE             PULMONIC VALVE LVOT Vmax:   99.00 cm/s  PR End Diast Vel: 8.88 msec LVOT Vmean:  65.200 cm/s LVOT VTI:    0.175 m AI PHT:      522 msec  AORTA Ao Root diam: 3.10 cm Ao Asc diam:  3.60 cm MITRAL VALVE               TRICUSPID VALVE MV Area (PHT): 3.93 cm    TR Peak grad:   26.8 mmHg MV Decel Time: 193 msec    TR Vmax:        259.00 cm/s MV E velocity: 38.80 cm/s MV A velocity: 86.30 cm/s  SHUNTS MV E/A ratio:  0.45        Systemic VTI:  0.18 m                            Systemic Diam: 2.10 cm Jenkins Rouge MD Electronically signed by Jenkins Rouge MD Signature Date/Time: 02/14/2021/4:25:43 PM    Final    CT Angio Chest/Abd/Pel for Dissection W and/or W/WO  Result Date: 02/12/2021 CLINICAL DATA:  Widened mediastinum EXAM: CT ANGIOGRAPHY CHEST, ABDOMEN AND PELVIS TECHNIQUE: Non-contrast CT of the chest was initially obtained. Multidetector CT imaging through the chest, abdomen and pelvis was performed using the standard protocol during bolus administration of intravenous contrast. Multiplanar reconstructed images and MIPs were obtained and reviewed to evaluate the vascular anatomy. CONTRAST:  85m OMNIPAQUE IOHEXOL 350 MG/ML SOLN COMPARISON:  None. FINDINGS: CTA CHEST FINDINGS Cardiovascular: Pulmonary embolus seen in the right and left pulmonary arteries which extends into the lobar arteries and multiple bilateral segmental and subsegmental pulmonary arteries. Elevated RV to LV ratio. No pericardial effusion. Mediastinum/Nodes: Patulous esophagus. No pathologically enlarged lymph nodes seen in the chest. Lungs/Pleura: Central airways are patent. Bibasilar atelectasis. No consolidation, pleural effusion or pneumothorax. Musculoskeletal: No chest wall  abnormality. No acute or significant osseous findings. Review of the MIP images confirms the above findings. CTA ABDOMEN AND PELVIS FINDINGS VASCULAR Aorta: Normal caliber thoracic aorta with no evidence of dissection or intramural hematoma. Heterogeneous opacification of the distal aortic arch and proximal thoracic aorta, likely due to mixing artifact. Standard 3 vessel aortic arch with patent arch vessels. Mild to moderate calcified and noncalcified plaque. Celiac: Patent without evidence of aneurysm, dissection, or vasculitis. Severe narrowing of the proximal celiac artery, likely due to diaphragmatic compression. SMA: Patent without evidence of aneurysm, dissection, vasculitis or significant stenosis. Renals: Both renal arteries are patent without evidence of aneurysm, dissection, vasculitis, fibromuscular dysplasia or significant stenosis. Accessory left renal artery to the upper pole. IMA: Patent without evidence of aneurysm, dissection, vasculitis or significant stenosis. Inflow: Patent without evidence of aneurysm, dissection, vasculitis or significant stenosis. Mild scattered calcified plaque. Veins: Reflux of contrast into the hepatic veins, which can be seen in the setting of right heart dysfunction. Review of the MIP images confirms the above findings. NON-VASCULAR Hepatobiliary: No focal liver abnormality is seen. Cholelithiasis. Layering high density material seen in the gallbladder, likely due to sludge.  No gallbladder wall thickening. Pancreas: Unremarkable. No pancreatic ductal dilatation or surrounding inflammatory changes. Spleen: Normal in size without focal abnormality. Adrenals/Urinary Tract: Bilateral adrenal glands are unremarkable. No hydronephrosis. Low-density lesion of the mid region of the left kidney, likely a simple cyst. Air is seen in the urinary bladder. Stomach/Bowel: Stomach is within normal limits. Appendix is not visualized. No evidence of bowel wall thickening, distention, or  inflammatory changes. Lymphatic: No pathologically enlarged lymph nodes seen in the abdomen or pelvis. Reproductive: Multiple calcified fibroids in the uterus. Other: Small fat containing hernia of the ventral abdominal wall. Small right inguinal hernia containing nondilated loops of bowel. No abdominopelvic ascites. Musculoskeletal: No acute or significant osseous findings. Review of the MIP images confirms the above findings. IMPRESSION: 1. Pulmonary embolus seen in the right and left pulmonary arteries, lobar arteries and multiple bilateral segmental and subsegmental pulmonary arteries. 2. Elevated RV to LV ratio, compatible with right heart strain. 3. No evidence of acute aortic syndrome. Heterogeneous opacification of the distal aortic arch and proximal descending thoracic aorta is likely due to mixing artifact. 4. Air is seen in the urinary bladder, correlate for history of recent instrumentation. Findings can also be seen in the setting of infection. 5.  Aortic Atherosclerosis (ICD10-I70.0). Critical Value/emergent results were called by telephone at the time of interpretation on 02/12/2021 at 6:19 pm to provider [facility] , who verbally acknowledged these results. Electronically Signed   By: Yetta Glassman M.D.   On: 02/12/2021 18:35   VAS [place] LOWER EXTREMITY VENOUS (DVT)  Result Date: 02/13/2021  Lower Venous DVT Study Patient Name:  YESICA KEMLER  Date of Exam:   02/13/2021 Medical Rec #: [id number]      Accession #:    [id number] Date of Birth: [birth year]      Patient Gender: F Patient Age:   87 years Exam Location:  [facility] Procedure:      VAS [place] LOWER EXTREMITY VENOUS (DVT) Referring Phys: Hillery Aldo --------------------------------------------------------------------------------  Indications: Pulmonary embolism status post bilateral pulmonary thrombectomy by IR.  Risk Factors: Alzheimer's disease. Limitations: Patient is contracted and has altered mental status. Comparison  Study: No prior study Performing Technologist: Sharion Dove RVS  Examination Guidelines: A complete evaluation includes B-mode imaging, spectral Doppler, color Doppler, and power Doppler as needed of all accessible portions of each vessel. Bilateral testing is considered an integral part of a complete examination. Limited examinations for reoccurring indications may be performed as noted. The reflux portion of the exam is performed with the patient in reverse Trendelenburg.  Right Technical Findings: Right leg not evaluated secondary to contraction  +---------+---------------+---------+-----------+----------+-------------------+  LEFT      Compressibility Phasicity Spontaneity Properties Thrombus Aging       +---------+---------------+---------+-----------+----------+-------------------+  CFV                                                        Not well visualized  +---------+---------------+---------+-----------+----------+-------------------+  SFJ                                                        Not well visualized  +---------+---------------+---------+-----------+----------+-------------------+  FV Prox  Not well visualized  +---------+---------------+---------+-----------+----------+-------------------+  FV Mid    Partial         Yes       Yes                    Acute                +---------+---------------+---------+-----------+----------+-------------------+  FV Distal None            No        No                     Acute                +---------+---------------+---------+-----------+----------+-------------------+  PFV                                                        Not well visualized  +---------+---------------+---------+-----------+----------+-------------------+  POP       None            No        No                     Acute                +---------+---------------+---------+-----------+----------+-------------------+  PTV        None                                             Acute                +---------+---------------+---------+-----------+----------+-------------------+  PERO                                                       Not well visualized  +---------+---------------+---------+-----------+----------+-------------------+    Summary: RIGHT: Not evaluated secondary to contraction  LEFT: - Findings consistent with acute deep vein thrombosis involving the left femoral vein, left popliteal vein, and left posterior tibial veins. - Limited study secondary to contraction  *See table(s) above for measurements and observations. Electronically signed by Harold Barban MD on 02/13/2021 at 7:38:58 PM.    Final     Subjective: She is alert, conversant, eating some breakfast.  Denies pain   Discharge Exam: Vitals:   02/22/21 0721 02/22/21 1630  BP: 123/65 102/61  Pulse: 77 77  Resp: 17 16  Temp: (!) 97.5 F (36.4 C) 98.1 F (36.7 C)  SpO2: 100% 100%     General: Pt is alert, awake, not in acute distress Cardiovascular: RRR, S1/S2 +, no rubs, no gallops Respiratory: CTA bilaterally, no wheezing, no rhonchi Abdominal: Soft, NT, ND, bowel sounds + Extremities: no edema, no cyanosis    The results of significant diagnostics from this hospitalization (including imaging, microbiology, ancillary and laboratory) are listed below for reference.     Microbiology: Recent Results (from the past 240 hour(s))  Culture, blood (Routine x 2)     Status: None   Collection Time: 02/12/21  5:25 PM   Specimen: Left Antecubital; Blood  Result Value Ref Range Status   Specimen Description LEFT ANTECUBITAL  Final   Special Requests   Final    BOTTLES DRAWN AEROBIC AND ANAEROBIC Blood Culture adequate volume   Culture   Final    NO GROWTH 5 DAYS Performed at [facility], [address], [place], [place] [address]    Report Status 02/17/2021 FINAL  Final  MRSA Next Gen by PCR, Nasal     Status: None   Collection  Time: 02/13/21  7:12 AM   Specimen: Nasal Mucosa; Nasal Swab  Result Value Ref Range Status   MRSA by PCR Next Gen NOT DETECTED NOT DETECTED Final    Comment: (NOTE) The GeneXpert MRSA Assay (FDA approved for NASAL specimens only), is one component of a comprehensive MRSA colonization surveillance program. It is not intended to diagnose MRSA infection nor to guide or monitor treatment for MRSA infections. Test performance is not FDA approved in patients less than 26 years old. Performed at [facility], [place] [address] [place], [place] [address]   SARS CORONAVIRUS 2 (TAT 6-24 HRS) Nasopharyngeal Nasopharyngeal Swab     Status: None   Collection Time: 02/21/21  6:11 PM   Specimen: Nasopharyngeal Swab  Result Value Ref Range Status   SARS Coronavirus 2 NEGATIVE NEGATIVE Final    Comment: (NOTE) SARS-CoV-2 target nucleic acids are NOT DETECTED.  The SARS-CoV-2 RNA is generally detectable in upper and lower respiratory specimens during the acute phase of infection. Negative results do not preclude SARS-CoV-2 infection, do not rule out co-infections with other pathogens, and should not be used as the sole basis for treatment or other patient management decisions. Negative results must be combined with clinical observations, patient history, and epidemiological information. The expected result is Negative.  Fact Sheet for Patients: SugarRoll.be  Fact Sheet for Healthcare Providers: https://www.woods-mathews.com/  This test is not yet approved or cleared by the [place] FDA and  has been authorized for detection and/or diagnosis of SARS-CoV-2 by FDA under an Emergency Use Authorization (EUA). This EUA will remain  in effect (meaning this test can be used) for the duration of the COVID-19 declaration under Se ction 564(b)(1) of the Act, 21 U.S.C. section 360bbb-3(b)(1), unless the authorization is terminated or revoked  sooner.  Performed at [facility], [place] [address] [place], [place] [address]      Labs: BNP (last 3 results) Recent Labs    02/12/21 1556  BNP 034.7*   Basic Metabolic Panel: Recent Labs  Lab 02/16/21 0515 02/17/21 0227 02/18/21 0152 02/19/21 1044 02/21/21 0109 02/22/21 0408  NA 133* 133* 132* 134* 135 137  K 3.1* 3.4* 4.2 3.6 3.1* 3.4*  CL 104 103 104 105 104 108  CO2 24 25 21* 23 23 21*  GLUCOSE 94 92 89 94 106* 94  BUN 9 7* 8 6* 8 7*  CREATININE 0.57 0.54 0.52 0.48 0.57 0.58  CALCIUM 8.4* 8.3* 8.4* 8.2* 8.3* 8.4*  MG 1.8 2.0 1.8 1.8  --  1.6*  PHOS 2.0* 2.1* 2.0* 2.5  --  1.8*   Liver Function Tests: No results for input(s): AST, ALT, ALKPHOS, BILITOT, PROT, ALBUMIN in the last 168 hours. No results for input(s): LIPASE, AMYLASE in the last 168 hours. No results for input(s): AMMONIA in the last 168 hours. CBC: Recent Labs  Lab 02/16/21 0515 02/18/21 0800 02/19/21 1044 02/22/21 0737  WBC 9.7 8.0 6.6 4.5  NEUTROABS 7.4  --   --   --  HGB 9.2* 8.2* 9.3* 10.6*  HCT 27.1* 24.6* 27.5* 31.6*  MCV 97.5 99.2 98.9 100.3*  PLT 152 214 271 275   Cardiac Enzymes: No results for input(s): CKTOTAL, CKMB, CKMBINDEX, TROPONINI in the last 168 hours. BNP: Invalid input(s): POCBNP CBG: No results for input(s): GLUCAP in the last 168 hours. D-Dimer No results for input(s): DDIMER in the last 72 hours. Hgb A1c No results for input(s): HGBA1C in the last 72 hours. Lipid Profile No results for input(s): CHOL, HDL, LDLCALC, TRIG, CHOLHDL, LDLDIRECT in the last 72 hours. Thyroid function studies No results for input(s): TSH, T4TOTAL, T3FREE, THYROIDAB in the last 72 hours.  Invalid input(s): FREET3 Anemia work up No results for input(s): VITAMINB12, FOLATE, FERRITIN, TIBC, IRON, RETICCTPCT in the last 72 hours. Urinalysis    Component Value Date/Time   COLORURINE YELLOW 02/12/2021 1556   APPEARANCEUR CLEAR 02/12/2021 1556   LABSPEC >1.030 (H) 02/12/2021  1556   LABSPEC 1.030 09/24/2018 1253   PHURINE 5.5 02/12/2021 1556   GLUCOSEU 100 (A) 02/12/2021 1556   HGBUR NEGATIVE 02/12/2021 1556   BILIRUBINUR SMALL (A) 02/12/2021 1556   BILIRUBINUR negative 09/24/2018 1253   KETONESUR 15 (A) 02/12/2021 1556   PROTEINUR 100 (A) 02/12/2021 1556   NITRITE POSITIVE (A) 02/12/2021 1556   LEUKOCYTESUR NEGATIVE 02/12/2021 1556   Sepsis Labs Invalid input(s): PROCALCITONIN,  WBC,  LACTICIDVEN Microbiology Recent Results (from the past 240 hour(s))  Culture, blood (Routine x 2)     Status: None   Collection Time: 02/12/21  5:25 PM   Specimen: Left Antecubital; Blood  Result Value Ref Range Status   Specimen Description LEFT ANTECUBITAL  Final   Special Requests   Final    BOTTLES DRAWN AEROBIC AND ANAEROBIC Blood Culture adequate volume   Culture   Final    NO GROWTH 5 DAYS Performed at [facility], [place] [address], [place], [place] [address]    Report Status 02/17/2021 FINAL  Final  MRSA Next Gen by PCR, Nasal     Status: None   Collection Time: 02/13/21  7:12 AM   Specimen: Nasal Mucosa; Nasal Swab  Result Value Ref Range Status   MRSA by PCR Next Gen NOT DETECTED NOT DETECTED Final    Comment: (NOTE) The GeneXpert MRSA Assay (FDA approved for NASAL specimens only), is one component of a comprehensive MRSA colonization surveillance program. It is not intended to diagnose MRSA infection nor to guide or monitor treatment for MRSA infections. Test performance is not FDA approved in patients less than 59 years old. Performed at [facility], [place] [address] [place], [place] [address]   SARS CORONAVIRUS 2 (TAT 6-24 HRS) Nasopharyngeal Nasopharyngeal Swab     Status: None   Collection Time: 02/21/21  6:11 PM   Specimen: Nasopharyngeal Swab  Result Value Ref Range Status   SARS Coronavirus 2 NEGATIVE NEGATIVE Final    Comment: (NOTE) SARS-CoV-2 target nucleic acids are NOT DETECTED.  The SARS-CoV-2 RNA is generally detectable  in upper and lower respiratory specimens during the acute phase of infection. Negative results do not preclude SARS-CoV-2 infection, do not rule out co-infections with other pathogens, and should not be used as the sole basis for treatment or other patient management decisions. Negative results must be combined with clinical observations, patient history, and epidemiological information. The expected result is Negative.  Fact Sheet for Patients: SugarRoll.be  Fact Sheet for Healthcare Providers: https://www.woods-mathews.com/  This test is not yet approved or cleared by  the [employer] and  has been authorized for detection and/or diagnosis of SARS-CoV-2 by FDA under an Emergency Use Authorization (EUA). This EUA will remain  in effect (meaning this test can be used) for the duration of the COVID-19 declaration under Se ction 564(b)(1) of the Act, 21 U.S.C. section 360bbb-3(b)(1), unless the authorization is terminated or revoked sooner.  Performed at [facility], [place] [address], [place], [place] [address]      Time coordinating discharge: 40 minutes  SIGNED:   Elmarie Shiley, MD  [facility]

## 2021-02-22 NOTE — Progress Notes (Signed)
Physical Therapy Treatment Patient Details Name: [name] [name] MRN: [id number] DOB: September 14, 1931 Today's Date: 02/22/2021   History of Present Illness 87 y/o female admitted with AMS.  PMH positive for a-fib, GERD, HTN, PVD, HLD, OSA and dementia. 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    PT Comments    Patient progressing slowly towards PT goals. Continues to require total A for all mobility; able to sit EOB with Min-Mod A with posterior bias. Tolerated there ex sitting EOB. Pt better able to move LEs today with less knee flexion contractures appreciated. Repositioned in bed with pillows to prevent skin breakdown. Will follow.    Recommendations for follow up therapy are one component of a multi-disciplinary discharge planning process, led by the attending physician.  Recommendations may be updated based on patient status, additional functional criteria and insurance authorization.  Follow Up Recommendations  Skilled nursing-short term rehab (<3 hours/day)     Assistance Recommended at Discharge Intermittent Supervision/Assistance  Equipment Recommendations  Other (comment) (defer to next venue)    Recommendations for Other Services       Precautions / Restrictions Precautions Precautions: Fall Restrictions Weight Bearing Restrictions: No     Mobility  Bed Mobility Overal bed mobility: Needs Assistance Bed Mobility: Rolling;Supine to Sit Rolling: Max assist   Supine to sit: Total assist;HOB elevated Sit to supine: Total assist;HOB elevated   General bed mobility comments: able to reach with UEs and hold rails, but overall total assist, rolling to right/left to change pads. Assist with LEs, trunk and scooting bottom to EOB.    Transfers                        Ambulation/Gait                   Stairs             Wheelchair Mobility    Modified  Rankin (Stroke Patients Only)       Balance Overall balance assessment: Needs assistance Sitting-balance support: Feet unsupported;Bilateral upper extremity supported Sitting balance-Leahy Scale: Poor Sitting balance - Comments: Mod-Min A for sitting balance, posterior lean. Postural control: Posterior lean                                  Cognition Arousal/Alertness: Awake/alert Behavior During Therapy: WFL for tasks assessed/performed Overall Cognitive Status: History of cognitive impairments - at baseline                                 General Comments: hx of dementia, anticipate baseline. requires mulitmodal cueing to follow simple commands and requires cueing to attend to task, distractable, talking about topics unrelated to conversation.        Exercises General Exercises - Lower Extremity Long Arc Quad: Strengthening;Both;10 reps;Seated    General Comments        Pertinent Vitals/Pain Pain Assessment: Faces Faces Pain Scale: Hurts a little bit Pain Location: generalized Pain Descriptors / Indicators: Discomfort;Guarding Pain Intervention(s): Monitored during session;Repositioned    Home Living                          Prior Function  PT Goals (current goals can now be found in the care plan section) Progress towards PT goals: Progressing toward goals (slowly)    Frequency    Min 2X/week      PT Plan Current plan remains appropriate    Co-evaluation              AM-PAC PT "6 Clicks" Mobility   Outcome Measure  Help needed turning from your back to your side while in a flat bed without using bedrails?: Total Help needed moving from lying on your back to sitting on the side of a flat bed without using bedrails?: Total Help needed moving to and from a bed to a chair (including a wheelchair)?: Total Help needed standing up from a chair using your arms (e.g., wheelchair or bedside chair)?:  Total Help needed to walk in hospital room?: Total Help needed climbing 3-5 steps with a railing? : Total 6 Click Score: 6    End of Session   Activity Tolerance: Patient limited by pain;Patient tolerated treatment well Patient left: in bed;with call bell/phone within reach;with bed alarm set Nurse Communication: Mobility status;Need for lift equipment PT Visit Diagnosis: Muscle weakness (generalized) (M62.81);Other abnormalities of gait and mobility (R26.89)     Time: 1355-1415 PT Time Calculation (min) (ACUTE ONLY): 20 min  Charges:  $Therapeutic Activity: 8-22 mins                     Marisa Severin, PT, DPT Acute Rehabilitation Services Pager [phone] Office [phone]      Marguarite Arbour A Sabra Heck 02/22/2021, 3:15 PM

## 2021-02-23 DIAGNOSIS — I5032 Chronic diastolic (congestive) heart failure: Secondary | ICD-10-CM | POA: Diagnosis not present

## 2021-02-23 DIAGNOSIS — I82432 Acute embolism and thrombosis of left popliteal vein: Secondary | ICD-10-CM | POA: Diagnosis not present

## 2021-02-23 DIAGNOSIS — D649 Anemia, unspecified: Secondary | ICD-10-CM | POA: Diagnosis not present

## 2021-02-23 DIAGNOSIS — R41841 Cognitive communication deficit: Secondary | ICD-10-CM | POA: Diagnosis not present

## 2021-02-23 DIAGNOSIS — L89153 Pressure ulcer of sacral region, stage 3: Secondary | ICD-10-CM | POA: Diagnosis not present

## 2021-02-23 DIAGNOSIS — Z743 Need for continuous supervision: Secondary | ICD-10-CM | POA: Diagnosis not present

## 2021-02-23 DIAGNOSIS — Z86711 Personal history of pulmonary embolism: Secondary | ICD-10-CM | POA: Diagnosis not present

## 2021-02-23 DIAGNOSIS — G309 Alzheimer's disease, unspecified: Secondary | ICD-10-CM | POA: Diagnosis not present

## 2021-02-23 DIAGNOSIS — F0280 Dementia in other diseases classified elsewhere without behavioral disturbance: Secondary | ICD-10-CM | POA: Diagnosis present

## 2021-02-23 DIAGNOSIS — R4182 Altered mental status, unspecified: Secondary | ICD-10-CM | POA: Diagnosis not present

## 2021-02-23 DIAGNOSIS — R4189 Other symptoms and signs involving cognitive functions and awareness: Secondary | ICD-10-CM | POA: Diagnosis not present

## 2021-02-23 DIAGNOSIS — Z20822 Contact with and (suspected) exposure to covid-19: Secondary | ICD-10-CM | POA: Diagnosis not present

## 2021-02-23 DIAGNOSIS — R630 Anorexia: Secondary | ICD-10-CM | POA: Diagnosis not present

## 2021-02-23 DIAGNOSIS — I959 Hypotension, unspecified: Secondary | ICD-10-CM | POA: Diagnosis not present

## 2021-02-23 DIAGNOSIS — Z7901 Long term (current) use of anticoagulants: Secondary | ICD-10-CM | POA: Diagnosis not present

## 2021-02-23 DIAGNOSIS — R5381 Other malaise: Secondary | ICD-10-CM | POA: Diagnosis not present

## 2021-02-23 DIAGNOSIS — I82442 Acute embolism and thrombosis of left tibial vein: Secondary | ICD-10-CM | POA: Diagnosis not present

## 2021-02-23 DIAGNOSIS — U071 COVID-19: Secondary | ICD-10-CM | POA: Diagnosis not present

## 2021-02-23 DIAGNOSIS — R197 Diarrhea, unspecified: Secondary | ICD-10-CM | POA: Diagnosis not present

## 2021-02-23 DIAGNOSIS — R778 Other specified abnormalities of plasma proteins: Secondary | ICD-10-CM | POA: Diagnosis not present

## 2021-02-23 DIAGNOSIS — L89894 Pressure ulcer of other site, stage 4: Secondary | ICD-10-CM | POA: Diagnosis not present

## 2021-02-23 DIAGNOSIS — R0989 Other specified symptoms and signs involving the circulatory and respiratory systems: Secondary | ICD-10-CM | POA: Diagnosis not present

## 2021-02-23 DIAGNOSIS — L89154 Pressure ulcer of sacral region, stage 4: Secondary | ICD-10-CM | POA: Diagnosis not present

## 2021-02-23 DIAGNOSIS — I2699 Other pulmonary embolism without acute cor pulmonale: Secondary | ICD-10-CM | POA: Diagnosis not present

## 2021-02-23 DIAGNOSIS — I82402 Acute embolism and thrombosis of unspecified deep veins of left lower extremity: Secondary | ICD-10-CM | POA: Diagnosis not present

## 2021-02-23 DIAGNOSIS — I82412 Acute embolism and thrombosis of left femoral vein: Secondary | ICD-10-CM | POA: Diagnosis not present

## 2021-02-23 DIAGNOSIS — N390 Urinary tract infection, site not specified: Secondary | ICD-10-CM

## 2021-02-23 DIAGNOSIS — Z79899 Other long term (current) drug therapy: Secondary | ICD-10-CM | POA: Diagnosis not present

## 2021-02-23 DIAGNOSIS — R6521 Severe sepsis with septic shock: Secondary | ICD-10-CM | POA: Diagnosis not present

## 2021-02-23 DIAGNOSIS — R051 Acute cough: Secondary | ICD-10-CM | POA: Diagnosis not present

## 2021-02-23 DIAGNOSIS — L89312 Pressure ulcer of right buttock, stage 2: Secondary | ICD-10-CM | POA: Diagnosis not present

## 2021-02-23 DIAGNOSIS — I10 Essential (primary) hypertension: Secondary | ICD-10-CM | POA: Diagnosis not present

## 2021-02-23 DIAGNOSIS — A419 Sepsis, unspecified organism: Secondary | ICD-10-CM | POA: Diagnosis not present

## 2021-02-23 DIAGNOSIS — F0390 Unspecified dementia without behavioral disturbance: Secondary | ICD-10-CM | POA: Diagnosis not present

## 2021-02-23 DIAGNOSIS — E43 Unspecified severe protein-calorie malnutrition: Secondary | ICD-10-CM | POA: Diagnosis not present

## 2021-02-23 DIAGNOSIS — L89150 Pressure ulcer of sacral region, unstageable: Secondary | ICD-10-CM | POA: Diagnosis not present

## 2021-02-23 DIAGNOSIS — Z66 Do not resuscitate: Secondary | ICD-10-CM | POA: Diagnosis not present

## 2021-02-23 DIAGNOSIS — R5383 Other fatigue: Secondary | ICD-10-CM | POA: Diagnosis not present

## 2021-02-23 DIAGNOSIS — R2689 Other abnormalities of gait and mobility: Secondary | ICD-10-CM | POA: Diagnosis not present

## 2021-02-23 DIAGNOSIS — R109 Unspecified abdominal pain: Secondary | ICD-10-CM | POA: Diagnosis not present

## 2021-02-23 DIAGNOSIS — R532 Functional quadriplegia: Secondary | ICD-10-CM | POA: Diagnosis not present

## 2021-02-23 DIAGNOSIS — L89302 Pressure ulcer of unspecified buttock, stage 2: Secondary | ICD-10-CM | POA: Diagnosis not present

## 2021-02-23 DIAGNOSIS — I82401 Acute embolism and thrombosis of unspecified deep veins of right lower extremity: Secondary | ICD-10-CM | POA: Diagnosis not present

## 2021-02-23 DIAGNOSIS — D638 Anemia in other chronic diseases classified elsewhere: Secondary | ICD-10-CM | POA: Diagnosis present

## 2021-02-23 DIAGNOSIS — Z86718 Personal history of other venous thrombosis and embolism: Secondary | ICD-10-CM | POA: Diagnosis not present

## 2021-02-23 DIAGNOSIS — E782 Mixed hyperlipidemia: Secondary | ICD-10-CM | POA: Diagnosis not present

## 2021-02-23 DIAGNOSIS — Z809 Family history of malignant neoplasm, unspecified: Secondary | ICD-10-CM | POA: Diagnosis not present

## 2021-02-23 DIAGNOSIS — E876 Hypokalemia: Secondary | ICD-10-CM | POA: Diagnosis not present

## 2021-02-23 DIAGNOSIS — I2609 Other pulmonary embolism with acute cor pulmonale: Secondary | ICD-10-CM | POA: Diagnosis not present

## 2021-02-23 DIAGNOSIS — R531 Weakness: Secondary | ICD-10-CM | POA: Diagnosis not present

## 2021-02-23 DIAGNOSIS — N179 Acute kidney failure, unspecified: Secondary | ICD-10-CM | POA: Diagnosis not present

## 2021-02-23 DIAGNOSIS — I739 Peripheral vascular disease, unspecified: Secondary | ICD-10-CM | POA: Diagnosis present

## 2021-02-23 DIAGNOSIS — K5641 Fecal impaction: Secondary | ICD-10-CM | POA: Diagnosis present

## 2021-02-23 DIAGNOSIS — M25569 Pain in unspecified knee: Secondary | ICD-10-CM | POA: Diagnosis not present

## 2021-02-23 DIAGNOSIS — M6281 Muscle weakness (generalized): Secondary | ICD-10-CM | POA: Diagnosis not present

## 2021-02-23 DIAGNOSIS — Z7189 Other specified counseling: Secondary | ICD-10-CM | POA: Diagnosis not present

## 2021-02-23 DIAGNOSIS — R770 Abnormality of albumin: Secondary | ICD-10-CM | POA: Diagnosis not present

## 2021-02-23 DIAGNOSIS — Z515 Encounter for palliative care: Secondary | ICD-10-CM | POA: Diagnosis not present

## 2021-02-23 DIAGNOSIS — G9341 Metabolic encephalopathy: Secondary | ICD-10-CM | POA: Diagnosis not present

## 2021-02-23 DIAGNOSIS — I110 Hypertensive heart disease with heart failure: Secondary | ICD-10-CM | POA: Diagnosis not present

## 2021-02-23 DIAGNOSIS — I700 Atherosclerosis of aorta: Secondary | ICD-10-CM | POA: Diagnosis not present

## 2021-02-23 DIAGNOSIS — Z7409 Other reduced mobility: Secondary | ICD-10-CM | POA: Diagnosis not present

## 2021-02-23 DIAGNOSIS — Z7401 Bed confinement status: Secondary | ICD-10-CM | POA: Diagnosis not present

## 2021-02-23 DIAGNOSIS — E785 Hyperlipidemia, unspecified: Secondary | ICD-10-CM | POA: Diagnosis not present

## 2021-02-23 DIAGNOSIS — R627 Adult failure to thrive: Secondary | ICD-10-CM | POA: Diagnosis not present

## 2021-02-23 DIAGNOSIS — L89893 Pressure ulcer of other site, stage 3: Secondary | ICD-10-CM | POA: Diagnosis not present

## 2021-02-23 DIAGNOSIS — I480 Paroxysmal atrial fibrillation: Secondary | ICD-10-CM | POA: Diagnosis not present

## 2021-02-23 DIAGNOSIS — K219 Gastro-esophageal reflux disease without esophagitis: Secondary | ICD-10-CM | POA: Diagnosis present

## 2021-02-23 DIAGNOSIS — Z8249 Family history of ischemic heart disease and other diseases of the circulatory system: Secondary | ICD-10-CM | POA: Diagnosis not present

## 2021-02-23 LAB — PHOSPHORUS: Phosphorus: 2.3 mg/dL — ABNORMAL LOW (ref 2.5–4.6)

## 2021-02-23 LAB — BASIC METABOLIC PANEL
Anion gap: 6 (ref 5–15)
BUN: 5 mg/dL — ABNORMAL LOW (ref 8–23)
CO2: 23 mmol/L (ref 22–32)
Calcium: 8.3 mg/dL — ABNORMAL LOW (ref 8.9–10.3)
Chloride: 106 mmol/L (ref 98–111)
Creatinine, Ser: 0.54 mg/dL (ref 0.44–1.00)
GFR, Estimated: >60 mL/min (ref >60)
Glucose, Bld: 88 mg/dL (ref 70–99)
Potassium: 3.9 mmol/L (ref 3.5–5.1)
Sodium: 135 mmol/L (ref 135–145)

## 2021-02-23 LAB — MAGNESIUM: Magnesium: 1.8 mg/dL (ref 1.7–2.4)

## 2021-02-23 NOTE — TOC Transition Note (Signed)
Transition of Care [facility]) - CM/SW Discharge Note   Patient Details  Name: [name] [name] MRN: [id number] Date of Birth: [birth year]  [facility] CM/SW Contact:  Emeterio Reeve, LCSW Phone Number: 02/23/2021, 9:44 AM   Clinical Narrative:     Per MD patient ready for DC to [facility] care. RN, patient, patient's family, and facility notified of DC. Discharge Summary and FL2 sent to facility. DC packet on chart. Insurance Josem Kaufmann has been received and pt is covid negative. Ambulance transport requested for patient.    RN to call report to [phone].  CSW will sign off for now as social work intervention is no longer needed. Please consult [place] again if new needs arise.   Final next level of care: Skilled Nursing Facility Barriers to Discharge: Barriers Resolved   Patient Goals and CMS Choice Patient states their goals for this hospitalization and ongoing recovery are:: to build strenght CMS Medicare.gov Compare Post Acute Care list provided to:: Patient Choice offered to / list presented to : Adult Children  Discharge Placement              Patient chooses bed at: [facility] Patient to be transferred to facility by: [facility] Name of family member notified: daughter Patient and family notified of of transfer: 02/23/21  Discharge Plan and Services                                     Social Determinants of Health (SDOH) Interventions     Readmission Risk Interventions No flowsheet data found.

## 2021-02-23 NOTE — Progress Notes (Signed)
Occupational Therapy Treatment Patient Details Name: [name] [name] MRN: [id number] DOB: 16-Sep-1931 Today's Date: 02/23/2021   History of present illness 87 y/o female admitted with AMS.  PMH positive for a-fib, GERD, HTN, PVD, HLD, OSA and dementia. 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   OT comments  Pt making slow progress towards functional goals. Continues to require extensive assist and max - total A for all mobility. OT will continue to follow acutely to maximize level of function and safety   Recommendations for follow up therapy are one component of a multi-disciplinary discharge planning process, led by the attending physician.  Recommendations may be updated based on patient status, additional functional criteria and insurance authorization.    Follow Up Recommendations  Skilled nursing-short term rehab (<3 hours/day)    Assistance Recommended at Discharge Frequent or constant Supervision/Assistance  Equipment Recommendations  Other (comment);BSC/3in1;Wheelchair (measurements OT);Wheelchair cushion (measurements OT)    Recommendations for Other Services      Precautions / Restrictions Precautions Precautions: Fall Restrictions Weight Bearing Restrictions: No       Mobility Bed Mobility Overal bed mobility: Needs Assistance Bed Mobility: Rolling;Supine to Sit;Sit to Supine Rolling: Max assist   Supine to sit: Total assist;HOB elevated Sit to supine: Total assist;HOB elevated   General bed mobility comments: able to reach with UEs and hold rails, but overall total assist, rolling to right/left to change pads. Assist with LEs, trunk and scooting hips  to EOB.    Transfers Overall transfer level: Needs assistance   Transfers: Sit to/from Stand Sit to Stand: Total assist           General transfer comment: Attempted sit<>stand, reached partial rise with total  assist and pt requests to stop, pt returned to supine.     Balance Overall balance assessment: Needs assistance Sitting-balance support: Feet unsupported;Bilateral upper extremity supported Sitting balance-Leahy Scale: Poor Sitting balance - Comments: Mod-Min A for sitting balance, posterior lean. Postural control: Posterior lean   Standing balance-Leahy Scale: Zero                             ADL either performed or assessed with clinical judgement   ADL Overall ADL's : Needs assistance/impaired     Grooming: Wash/dry face;Wash/dry hands;Minimal assistance;Sitting           Upper Body Dressing : Maximal assistance;Sitting           Toileting- Clothing Manipulation and Hygiene: Total assistance;Bed level         General ADL Comments: max A for rolling in bed and total A completing peri care.    Extremity/Trunk Assessment Upper Extremity Assessment Upper Extremity Assessment: Generalized weakness   Lower Extremity Assessment Lower Extremity Assessment: Defer to PT evaluation   Cervical / Trunk Assessment Cervical / Trunk Assessment: Kyphotic;Other exceptions    Vision Baseline Vision/History: 1 Wears glasses Ability to See in Adequate Light: 0 Adequate Patient Visual Report: No change from baseline     Perception     Praxis      Cognition Arousal/Alertness: Awake/alert Behavior During Therapy: WFL for tasks assessed/performed Overall Cognitive Status: History of cognitive impairments - at baseline                                 General  Comments: hx of dementia, anticipate baseline. requires mulitmodal cueing to follow simple commands and requires cueing to attend to task, distractable, talking about topics unrelated to conversation.          Exercises     Shoulder Instructions       General Comments      Pertinent Vitals/ Pain       Pain Assessment: Faces Faces Pain Scale: Hurts a little bit Breathing:  normal Negative Vocalization: none Facial Expression: smiling or inexpressive Pain Location: generalized Pain Descriptors / Indicators: Discomfort;Guarding;Grimacing Pain Intervention(s): Monitored during session;Repositioned  Home Living                                          Prior Functioning/Environment              Frequency  Min 2X/week        Progress Toward Goals  OT Goals(current goals can now be found in the care plan section)  Progress towards OT goals: Progressing toward goals     Plan Discharge plan remains appropriate;Frequency remains appropriate    Co-evaluation                 AM-PAC OT "6 Clicks" Daily Activity     Outcome Measure   Help from another person eating meals?: A Little Help from another person taking care of personal grooming?: A Little Help from another person toileting, which includes using toliet, bedpan, or urinal?: Total Help from another person bathing (including washing, rinsing, drying)?: Total Help from another person to put on and taking off regular upper body clothing?: A Lot Help from another person to put on and taking off regular lower body clothing?: Total 6 Click Score: 11    End of Session    OT Visit Diagnosis: Unsteadiness on feet (R26.81);Other abnormalities of gait and mobility (R26.89);Muscle weakness (generalized) (M62.81);Pain   Activity Tolerance Patient tolerated treatment well   Patient Left in bed;with call bell/phone within reach;with bed alarm set   Nurse Communication          Time: 0981-1914 OT Time Calculation (min): 20 min  Charges: OT General Charges $OT Visit: 1 Visit OT Treatments $Therapeutic Activity: 8-22 mins    Britt Bottom 02/23/2021, 1:35 PM

## 2021-02-23 NOTE — Discharge Summary (Signed)
Physician Discharge Summary  [name] [name] LEX:[id number] DOB: 1931-10-24 DOA: 02/12/2021  PCP: Denita Lung, MD  Admit date: 02/12/2021 Discharge date: 02/23/2021  Admitted From: Home  Disposition:  SNF  Recommendations for Outpatient Follow-up:  Follow up with PCP in 1-2 weeks Please obtain BMP/CBC/ phosphorus, Mg in one week Minimize or avoid sedating medications Reorientation and delirium precautions.   Discharge Condition: Stable.  CODE STATUS:Full Code Diet recommendation: Regular diet.  Brief/Interim Summary: 87 year old past medical history significant for A. fib not on anticoagulation, GERD, hypertension, PVD, OSA, dementia who has had since 12/6 mental decline at home, poor oral intake.  Patient presented lethargic, febrile on arrival temperature 104, leukocytosis, CTA chest bilateral PE with right heart strain.  UA concerning for UTI.  She became hypotensive in the ED.  Received IV fluid and started on Levophed.  Started on broad-spectrum antibiotics.  CCM admitted patient.  TTE confirmed right heart strain.  LE US showed DVT extending from posterior tibial vein to left femoral vein.  Patient underwent pulmonary arteriography with catheter directed pulmonary thrombectomy by IR under general anesthesia on 02/13/2021.  She was anticoagulated with IV heparin and transition to p.o. Eliquis.  She also completed antibiotic course for UTI.  See individual problem list below for more hospital course.   Discharge Diagnoses:  Acute metabolic encephalopathy in the setting of sepsis shock and UTI improving Dementia without behavioral disturbance: Oriented only to self -Treat treatable causes as below -Reorientation delirium precautions  Septic shock due to UTI and pulmonary embolism: Resolved. -Completed antibiotic course -Monitor DVT as below   Massive PE with left lower extremity DVT-s/p thrombectomy -Completed starter pack Eliquis -Discharged on Eliquis 5 mg twice daily    A. fib, hypertension, PVD: -Continue Eliquis simvastatin  Elevated troponin: Likely demand ischemia from sepsis and PE.  TTE reassuring.   Hypokalemia/hypomagnesemia/hypophosphatemia: Resolved.   Anemia of critical illness: H&H relatively stable after initial drop. Recent Labs    10/12/20 1542 02/12/21 1556 02/13/21 0329 02/14/21 0500 02/15/21 0505 02/16/21 0515 02/18/21 0800 02/19/21 1044 02/22/21 0737  HGB 12.8 14.6 11.0* 9.5* 9.0* 9.2* 8.2* 9.3* 10.6*  -Recheck CBC in 1 week   AKI: Resolved.  Severe malnutrition Nutrition Problem: Severe Malnutrition Etiology: chronic illness (dementia) Signs/Symptoms: severe muscle depletion, severe fat depletion, percent weight loss (10.8% weight loss in 3 months) Percent weight loss: 10.8 % Interventions: Ensure Enlive (each supplement provides 350kcal and 20 grams of protein), Magic cup, MVI -Liberate diet. -Continue multivitamin and Ensure    GERD: Continue with PPI    Pressure skin injury: POA Pressure Injury 02/13/21 Sacrum Mid Stage 2 -  Partial thickness loss of dermis presenting as a shallow open injury with a red, pink wound bed without slough. pink (Active)  02/13/21 0200  Location: Sacrum  Location Orientation: Mid  Staging: Stage 2 -  Partial thickness loss of dermis presenting as a shallow open injury with a red, pink wound bed without slough.  Wound Description (Comments): pink  Present on Admission: Yes     Pressure Injury 02/13/21 Buttocks Right Stage 2 -  Partial thickness loss of dermis presenting as a shallow open injury with a red, pink wound bed without slough. pink (Active)  02/13/21 0200  Location: Buttocks  Location Orientation: Right  Staging: Stage 2 -  Partial thickness loss of dermis presenting as a shallow open injury with a red, pink wound bed without slough.  Wound Description (Comments): pink  Present on Admission: Yes   -See dressing  instruction below    Discharge Instructions  Discharge  Instructions     Diet general   Complete by: As directed    Discharge wound care:   Complete by: As directed    Silicone foam dressings to the buttock and sacral wounds, change every 3 days. Assess under dressings each shift for any acute changes in the wounds.   Increase activity slowly   Complete by: As directed       Allergies as of 02/23/2021   No Known Allergies      Medication List     STOP taking these medications    aspirin EC 81 MG tablet   atenolol-chlorthalidone 100-25 MG tablet Commonly known as: TENORETIC   ELDERBERRY PO   spironolactone 25 MG tablet Commonly known as: ALDACTONE       TAKE these medications    acetaminophen 650 MG CR tablet Commonly known as: TYLENOL Take 650-1,300 mg by mouth every 8 (eight) hours as needed (arthritis pain).   apixaban 5 MG Tabs tablet Commonly known as: ELIQUIS Take 1 tablet (5 mg total) by mouth 2 (two) times daily.   docusate sodium 100 MG capsule Commonly known as: COLACE Take 1 capsule (100 mg total) by mouth 2 (two) times daily as needed for mild constipation.   feeding supplement Liqd Take 237 mLs by mouth 3 (three) times daily between meals.   ferrous sulfate 325 (65 FE) MG tablet Take 1 tablet (325 mg total) by mouth daily with breakfast.   magnesium oxide 400 (240 Mg) MG tablet Commonly known as: MAG-OX Take 1 tablet (400 mg total) by mouth 2 (two) times daily.   memantine 14 MG Cp24 24 hr capsule Commonly known as: NAMENDA XR Take 1 capsule (14 mg total) by mouth daily.   multivitamin with minerals Tabs tablet Take 1 tablet by mouth daily.   pantoprazole 40 MG tablet Commonly known as: PROTONIX Take 1 tablet (40 mg total) by mouth daily.   phosphorus 155-852-130 MG tablet Commonly known as: K PHOS NEUTRAL Take 2 tablets (500 mg total) by mouth 2 (two) times daily.   simvastatin 40 MG tablet Commonly known as: ZOCOR Take 1 tablet (40 mg total) by mouth at bedtime.   triamcinolone  cream 0.5 % Commonly known as: KENALOG APPLY TOPICALLY 3 TIMES  DAILY What changed:  how much to take how to take this when to take this reasons to take this   VITAMIN B-6 PO Take 1 tablet by mouth daily.               Durable Medical Equipment  (From admission, onward)           Start     Ordered   02/18/21 1027  For home use only DME standard manual wheelchair with seat cushion  Once       Comments: Patient suffers from Acute metabolic encephalopathy in the setting of sepsis shock and UTI  which impairs their ability to perform daily activities like ambulating  in the home.  A cane  will not resolve issue with performing activities of daily living. A wheelchair will allow patient to safely perform daily activities. Patient can safely propel the wheelchair in the home or has a caregiver who can provide assistance. Length of need lifetime . Accessories: elevating leg rests (ELRs), wheel locks, extensions and anti-tippers.   Seat and back cushions   02/18/21 1027   02/18/21 1026  For home use only DME Hospital bed  Once  Comments: Call daughter Irena Reichmann [phone] for delivery thanks  Question Answer Comment  Length of Need Lifetime   Patient has (list medical condition): Acute metabolic encephalopathy in the setting of sepsis shock and UTI   The above medical condition requires: Patient requires the ability to reposition frequently   Head must be elevated greater than: 45 degrees   Bed type Semi-electric   Hoyer Lift Yes   Support Surface: Gel Overlay      02/18/21 1027              Discharge Care Instructions  (From admission, onward)           Start     Ordered   02/23/21 0000  Discharge wound care:       Comments: Silicone foam dressings to the buttock and sacral wounds, change every 3 days. Assess under dressings each shift for any acute changes in the wounds.   02/23/21 1601            Follow-up Information     Denita Lung, MD Follow up in 1 week(s).   Specialty: Family Medicine Contact information: [address] [place] [address] [phone]                No Known Allergies  Consultations: CCM admitted patient.  Interventional radiology   Procedures/Studies: IR THROMBECT PRIM MECH INIT (INCLU) MOD SED  Result Date: 02/13/2021 INDICATION: Briefly, 87 year old female comorbid with history of Alzheimer disease who presented with increased altered mental status. CTA revealing bilateral pulmonary emboli with marked RIGHT heart strain. Hypotensive. RV/LV ratio 2.3. sPESI 4, HIGH risk. EXAM: Procedures; 1. ULTRASOUND GUIDANCE FOR VENOUS ACCESS 2. PULMONARY ARTERIOGRAPHY 3. FLUOROSCOPIC GUIDED CATHETER DIRECTED BILATERAL PULMONARY THROMBECTOMY Performing Physician: Michaelle Birks, MDAssistant(s): Miachel Roux, MD A qualified trainee/resident or advanced practice provider (APP) was not immediately available to assist with this case. COMPARISON:  CTA CAP, 02/12/2021. MEDICATIONS: None ANESTHESIA/SEDATION: Sedation by the Anesthesia Team was performed. For details, please see anesthesia record. CONTRAST:  50mL OMNIPAQUE IOHEXOL 300 MG/ML  SOLN FLUOROSCOPY TIME:  35 minutes, 8 seconds (270.6 mGy) COMPLICATIONS: None immediate. TECHNIQUE: Informed written consent was obtained from the the patient and/or patient's representative after a discussion of the risks, benefits and alternatives to treatment. Questions regarding the procedure were encouraged and answered. A timeout was performed prior to the initiation of the procedure. Ultrasound scanning was performed of the right groin and demonstrated wide patency of the right common femoral vein. The right groin was prepped and draped in the usual sterile fashion, and a sterile drape was applied covering the operative field. Maximum barrier sterile technique with sterile gowns and gloves were used for the procedure. A timeout was performed prior to the initiation  of the procedure. Local anesthesia was provided with 1% lidocaine. Under direct ultrasound guidance, the RIGHT common femoral vein was accessed with a micro puncture kit ultimately allowing placement of a 6 Fr vascular sheath. Ultrasound and fluoroscopic spot images were saved for procedural documentation purposes. With the use of a 0.035 inch Bentson wire and an angled pigtail pulmonary catheter, access past the RIGHT heart and into the main pulmonary artery was obtained. Pre intervention pulmonary arterial pressures were obtained from the main pulmonary artery, then a limited central pulmonary arteriogram was performed. The Bentson wire was exchanged for an Amplatz wire then a 12 Fr, 33 cm Gore dry seal sheath then a 12 Fr Penumbra Lightning aspiration catheter was advanced into the LEFT  than RIGHT pulmonary arteries and catheter directed thrombectomy was performed. Intermittent and postprocedural LEFT than RIGHT pulmonary arteriograms were performed confirming thrombus removal. Post interventional pulmonary arterial pressures were obtained from the main pulmonary artery. The catheters and sheath were then removed, and hemostasis was obtained by manual pressure. The patient tolerated the procedure well without immediate postprocedural complication. FINDINGS: Central pulmonary arteriogram demonstrates large burden bilateral pulmonary emboli, partially-obstructing the LEFT and RIGHT pulmonary arterial bifurcations. Acquired pressure measurements: Pre: Main PA 26/14; mean-18 (normal: < 25/10) Post: Main PA 24/4; mean-14 (normal: < 25/10) Post intervention pulmonary arteriograms demonstrating adequate thrombectomy from the RIGHT pulmonary arterial bifurcation. Residual adherent thrombus was present at the LEFT pulmonary arterial bifurcation. IMPRESSION: 1. Pulmonary arteriogram revealing large burden bilateral pulmonary emboli, partially obstructing the LEFT and RIGHT pulmonary arterial bifurcations. 2. Elevated  pressures within the main PA compatible with critical pulmonary arterial hypertension. 3. Successful catheter directed bilateral pulmonary thrombectomy with adequate removal from the RIGHT pulmonary arterial bifurcation. Partial removal with residual adherent thrombus at the LEFT pulmonary arterial bifurcation. PLAN: Continue systemic anticoagulation with heparin gtt. Management per pulmonary and critical care team. Consider bilateral lower extremities Doppler US to evaluate for DVT. Procedural results were discussed at the time of completion to provider Hillery Aldo , who verbally acknowledged these results. Michaelle Birks, MD [facility] Radiology Electronically Signed   By: Michaelle Birks M.D.   On: 02/13/2021 09:52   IR US Guide Vasc Access Right  Result Date: 02/13/2021 INDICATION: Briefly, 87 year old female comorbid with history of Alzheimer disease who presented with increased altered mental status. CTA revealing bilateral pulmonary emboli with marked RIGHT heart strain. Hypotensive. RV/LV ratio 2.3. sPESI 4, HIGH risk. EXAM: Procedures; 1. ULTRASOUND GUIDANCE FOR VENOUS ACCESS 2. PULMONARY ARTERIOGRAPHY 3. FLUOROSCOPIC GUIDED CATHETER DIRECTED BILATERAL PULMONARY THROMBECTOMY Performing Physician: Michaelle Birks, MDAssistant(s): Miachel Roux, MD A qualified trainee/resident or advanced practice provider (APP) was not immediately available to assist with this case. COMPARISON:  CTA CAP, 02/12/2021. MEDICATIONS: None ANESTHESIA/SEDATION: Sedation by the Anesthesia Team was performed. For details, please see anesthesia record. CONTRAST:  67mL OMNIPAQUE IOHEXOL 300 MG/ML  SOLN FLUOROSCOPY TIME:  35 minutes, 8 seconds (063.0 mGy) COMPLICATIONS: None immediate. TECHNIQUE: Informed written consent was obtained from the the patient and/or patient's representative after a discussion of the risks, benefits and alternatives to treatment. Questions regarding the procedure  were encouraged and answered. A timeout was performed prior to the initiation of the procedure. Ultrasound scanning was performed of the right groin and demonstrated wide patency of the right common femoral vein. The right groin was prepped and draped in the usual sterile fashion, and a sterile drape was applied covering the operative field. Maximum barrier sterile technique with sterile gowns and gloves were used for the procedure. A timeout was performed prior to the initiation of the procedure. Local anesthesia was provided with 1% lidocaine. Under direct ultrasound guidance, the RIGHT common femoral vein was accessed with a micro puncture kit ultimately allowing placement of a 6 Fr vascular sheath. Ultrasound and fluoroscopic spot images were saved for procedural documentation purposes. With the use of a 0.035 inch Bentson wire and an angled pigtail pulmonary catheter, access past the RIGHT heart and into the main pulmonary artery was obtained. Pre intervention pulmonary arterial pressures were obtained from the main pulmonary artery, then a limited central pulmonary arteriogram was performed. The Bentson wire was exchanged for an Amplatz wire then a 12 Fr, 33 cm Gore dry seal sheath  then a 12 Fr Penumbra Lightning aspiration catheter was advanced into the LEFT than RIGHT pulmonary arteries and catheter directed thrombectomy was performed. Intermittent and postprocedural LEFT than RIGHT pulmonary arteriograms were performed confirming thrombus removal. Post interventional pulmonary arterial pressures were obtained from the main pulmonary artery. The catheters and sheath were then removed, and hemostasis was obtained by manual pressure. The patient tolerated the procedure well without immediate postprocedural complication. FINDINGS: Central pulmonary arteriogram demonstrates large burden bilateral pulmonary emboli, partially-obstructing the LEFT and RIGHT pulmonary arterial bifurcations. Acquired pressure  measurements: Pre: Main PA 26/14; mean-18 (normal: < 25/10) Post: Main PA 24/4; mean-14 (normal: < 25/10) Post intervention pulmonary arteriograms demonstrating adequate thrombectomy from the RIGHT pulmonary arterial bifurcation. Residual adherent thrombus was present at the LEFT pulmonary arterial bifurcation. IMPRESSION: 1. Pulmonary arteriogram revealing large burden bilateral pulmonary emboli, partially obstructing the LEFT and RIGHT pulmonary arterial bifurcations. 2. Elevated pressures within the main PA compatible with critical pulmonary arterial hypertension. 3. Successful catheter directed bilateral pulmonary thrombectomy with adequate removal from the RIGHT pulmonary arterial bifurcation. Partial removal with residual adherent thrombus at the LEFT pulmonary arterial bifurcation. PLAN: Continue systemic anticoagulation with heparin gtt. Management per pulmonary and critical care team. Consider bilateral lower extremities Doppler US to evaluate for DVT. Procedural results were discussed at the time of completion to provider Hillery Aldo , who verbally acknowledged these results. Michaelle Birks, MD [facility] Radiology Electronically Signed   By: Michaelle Birks M.D.   On: 02/13/2021 09:52   DG Chest Port 1 View  Result Date: 02/12/2021 CLINICAL DATA:  Sepsis EXAM: PORTABLE CHEST 1 VIEW COMPARISON:  None. FINDINGS: Normal cardiac silhouette. Widened upper mediastinum suggest ectatic aorta. Lungs are clear. No effusion, infiltrate pneumothorax. No acute osseous abnormality. IMPRESSION: No clear acute cardiopulmonary findings. Widened mediastinum favored ectatic aorta. Electronically Signed   By: Suzy Bouchard M.D.   On: 02/12/2021 16:36   ECHOCARDIOGRAM COMPLETE  Result Date: 02/14/2021    ECHOCARDIOGRAM REPORT   Patient Name:   RANELLE AUKER Date of Exam: 02/14/2021 Medical Rec #:  [id number]     Height:       59.0 in Accession #:    [id number]     Weight:       118.6 lb Date of Birth:  [birth year]     BSA:          1.477 m Patient Age:    3 years      BP:           94/58 mmHg Patient Gender: F             HR:           85 bpm. Exam Location:  Inpatient Procedure: 2D Echo, Cardiac Doppler and Color Doppler Indications:    Pulmonary Embolus I26.09  History:        Patient has no prior history of Echocardiogram examinations.                 Arrythmias:Atrial Fibrillation; Risk Factors:Dyslipidemia and                 Hypertension.  Sonographer:    Bernadene Person RDCS Referring Phys: [id number] [place]  1. Left ventricular ejection fraction, by estimation, is 60 to 65%. The left ventricle has normal function. The left ventricle has no regional wall motion abnormalities. Left ventricular diastolic parameters are consistent with Grade I diastolic dysfunction (impaired relaxation).  2. Right ventricular  systolic function is moderately reduced. The right ventricular size is moderately enlarged.  3. The pericardial effusion is posterior to the left ventricle.  4. The mitral valve is abnormal. Mild mitral valve regurgitation. No evidence of mitral stenosis.  5. The aortic valve is tricuspid. There is mild calcification of the aortic valve. Aortic valve regurgitation is mild. Aortic valve sclerosis/calcification is present, without any evidence of aortic stenosis.  6. The inferior vena cava is normal in size with greater than 50% respiratory variability, suggesting right atrial pressure of 3 mmHg. FINDINGS  Left Ventricle: Left ventricular ejection fraction, by estimation, is 60 to 65%. The left ventricle has normal function. The left ventricle has no regional wall motion abnormalities. The left ventricular internal cavity size was normal in size. There is  no left ventricular hypertrophy. Left ventricular diastolic parameters are consistent with Grade I diastolic dysfunction (impaired relaxation). Right Ventricle: The right ventricular size is  moderately enlarged. Right vetricular wall thickness was not assessed. Right ventricular systolic function is moderately reduced. Left Atrium: Left atrial size was normal in size. Right Atrium: Right atrial size was normal in size. Pericardium: Trivial pericardial effusion is present. The pericardial effusion is posterior to the left ventricle. Mitral Valve: The mitral valve is abnormal. There is mild thickening of the mitral valve leaflet(s). Mild mitral valve regurgitation. No evidence of mitral valve stenosis. Tricuspid Valve: The tricuspid valve is normal in structure. Tricuspid valve regurgitation is mild . No evidence of tricuspid stenosis. Aortic Valve: The aortic valve is tricuspid. There is mild calcification of the aortic valve. Aortic valve regurgitation is mild. Aortic regurgitation PHT measures 522 msec. Aortic valve sclerosis/calcification is present, without any evidence of aortic stenosis. Pulmonic Valve: The pulmonic valve was normal in structure. Pulmonic valve regurgitation is mild. No evidence of pulmonic stenosis. Aorta: The aortic root is normal in size and structure. Venous: The inferior vena cava is normal in size with greater than 50% respiratory variability, suggesting right atrial pressure of 3 mmHg. IAS/Shunts: No atrial level shunt detected by color flow Doppler.  LEFT VENTRICLE PLAX 2D LVIDd:         3.00 cm     Diastology LVIDs:         2.10 cm     LV e' medial:    4.24 cm/s LV PW:         0.90 cm     LV E/e' medial:  9.2 LV IVS:        1.10 cm     LV e' lateral:   7.51 cm/s LVOT diam:     2.10 cm     LV E/e' lateral: 5.2 LV SV:         61 LV SV Index:   41 LVOT Area:     3.46 cm  LV Volumes (MOD) LV vol d, MOD A2C: 43.6 ml LV vol d, MOD A4C: 73.6 ml LV vol s, MOD A2C: 22.3 ml LV vol s, MOD A4C: 29.1 ml LV SV MOD A2C:     21.3 ml LV SV MOD A4C:     73.6 ml LV SV MOD BP:      30.1 ml RIGHT VENTRICLE RV S prime:     14.00 cm/s TAPSE (M-mode): 2.0 cm LEFT ATRIUM             Index         RIGHT ATRIUM           Index LA diam:  2.70 cm 1.83 cm/m   RA Area:     13.60 cm LA Vol (A2C):   31.6 ml 21.39 ml/m  RA Volume:   28.10 ml  19.02 ml/m LA Vol (A4C):   31.4 ml 21.26 ml/m LA Biplane Vol: 34.0 ml 23.02 ml/m  AORTIC VALVE             PULMONIC VALVE LVOT Vmax:   99.00 cm/s  PR End Diast Vel: 8.88 msec LVOT Vmean:  65.200 cm/s LVOT VTI:    0.175 m AI PHT:      522 msec  AORTA Ao Root diam: 3.10 cm Ao Asc diam:  3.60 cm MITRAL VALVE               TRICUSPID VALVE MV Area (PHT): 3.93 cm    TR Peak grad:   26.8 mmHg MV Decel Time: 193 msec    TR Vmax:        259.00 cm/s MV E velocity: 38.80 cm/s MV A velocity: 86.30 cm/s  SHUNTS MV E/A ratio:  0.45        Systemic VTI:  0.18 m                            Systemic Diam: 2.10 cm Jenkins Rouge MD Electronically signed by Jenkins Rouge MD Signature Date/Time: 02/14/2021/4:25:43 PM    Final    CT Angio Chest/Abd/Pel for Dissection W and/or W/WO  Result Date: 02/12/2021 CLINICAL DATA:  Widened mediastinum EXAM: CT ANGIOGRAPHY CHEST, ABDOMEN AND PELVIS TECHNIQUE: Non-contrast CT of the chest was initially obtained. Multidetector CT imaging through the chest, abdomen and pelvis was performed using the standard protocol during bolus administration of intravenous contrast. Multiplanar reconstructed images and MIPs were obtained and reviewed to evaluate the vascular anatomy. CONTRAST:  52mL OMNIPAQUE IOHEXOL 350 MG/ML SOLN COMPARISON:  None. FINDINGS: CTA CHEST FINDINGS Cardiovascular: Pulmonary embolus seen in the right and left pulmonary arteries which extends into the lobar arteries and multiple bilateral segmental and subsegmental pulmonary arteries. Elevated RV to LV ratio. No pericardial effusion. Mediastinum/Nodes: Patulous esophagus. No pathologically enlarged lymph nodes seen in the chest. Lungs/Pleura: Central airways are patent. Bibasilar atelectasis. No consolidation, pleural effusion or pneumothorax. Musculoskeletal: No chest wall  abnormality. No acute or significant osseous findings. Review of the MIP images confirms the above findings. CTA ABDOMEN AND PELVIS FINDINGS VASCULAR Aorta: Normal caliber thoracic aorta with no evidence of dissection or intramural hematoma. Heterogeneous opacification of the distal aortic arch and proximal thoracic aorta, likely due to mixing artifact. Standard 3 vessel aortic arch with patent arch vessels. Mild to moderate calcified and noncalcified plaque. Celiac: Patent without evidence of aneurysm, dissection, or vasculitis. Severe narrowing of the proximal celiac artery, likely due to diaphragmatic compression. SMA: Patent without evidence of aneurysm, dissection, vasculitis or significant stenosis. Renals: Both renal arteries are patent without evidence of aneurysm, dissection, vasculitis, fibromuscular dysplasia or significant stenosis. Accessory left renal artery to the upper pole. IMA: Patent without evidence of aneurysm, dissection, vasculitis or significant stenosis. Inflow: Patent without evidence of aneurysm, dissection, vasculitis or significant stenosis. Mild scattered calcified plaque. Veins: Reflux of contrast into the hepatic veins, which can be seen in the setting of right heart dysfunction. Review of the MIP images confirms the above findings. NON-VASCULAR Hepatobiliary: No focal liver abnormality is seen. Cholelithiasis. Layering high density material seen in the gallbladder, likely due to sludge. No gallbladder wall thickening. Pancreas: Unremarkable. No pancreatic ductal dilatation  or surrounding inflammatory changes. Spleen: Normal in size without focal abnormality. Adrenals/Urinary Tract: Bilateral adrenal glands are unremarkable. No hydronephrosis. Low-density lesion of the mid region of the left kidney, likely a simple cyst. Air is seen in the urinary bladder. Stomach/Bowel: Stomach is within normal limits. Appendix is not visualized. No evidence of bowel wall thickening, distention, or  inflammatory changes. Lymphatic: No pathologically enlarged lymph nodes seen in the abdomen or pelvis. Reproductive: Multiple calcified fibroids in the uterus. Other: Small fat containing hernia of the ventral abdominal wall. Small right inguinal hernia containing nondilated loops of bowel. No abdominopelvic ascites. Musculoskeletal: No acute or significant osseous findings. Review of the MIP images confirms the above findings. IMPRESSION: 1. Pulmonary embolus seen in the right and left pulmonary arteries, lobar arteries and multiple bilateral segmental and subsegmental pulmonary arteries. 2. Elevated RV to LV ratio, compatible with right heart strain. 3. No evidence of acute aortic syndrome. Heterogeneous opacification of the distal aortic arch and proximal descending thoracic aorta is likely due to mixing artifact. 4. Air is seen in the urinary bladder, correlate for history of recent instrumentation. Findings can also be seen in the setting of infection. 5.  Aortic Atherosclerosis (ICD10-I70.0). Critical Value/emergent results were called by telephone at the time of interpretation on 02/12/2021 at 6:19 pm to provider [facility] , who verbally acknowledged these results. Electronically Signed   By: Yetta Glassman M.D.   On: 02/12/2021 18:35   VAS [place] LOWER EXTREMITY VENOUS (DVT)  Result Date: 02/13/2021  Lower Venous DVT Study Patient Name:  TONESHA TSOU  Date of Exam:   02/13/2021 Medical Rec #: [id number]      Accession #:    [id number] Date of Birth: 03-Jul-1931      Patient Gender: F Patient Age:   41 years Exam Location:  [facility] Procedure:      VAS [place] LOWER EXTREMITY VENOUS (DVT) Referring Phys: Hillery Aldo --------------------------------------------------------------------------------  Indications: Pulmonary embolism status post bilateral pulmonary thrombectomy by IR.  Risk Factors: Alzheimer's disease. Limitations: Patient is contracted and has altered mental status. Comparison  Study: No prior study Performing Technologist: Sharion Dove RVS  Examination Guidelines: A complete evaluation includes B-mode imaging, spectral Doppler, color Doppler, and power Doppler as needed of all accessible portions of each vessel. Bilateral testing is considered an integral part of a complete examination. Limited examinations for reoccurring indications may be performed as noted. The reflux portion of the exam is performed with the patient in reverse Trendelenburg.  Right Technical Findings: Right leg not evaluated secondary to contraction  +---------+---------------+---------+-----------+----------+-------------------+  LEFT      Compressibility Phasicity Spontaneity Properties Thrombus Aging       +---------+---------------+---------+-----------+----------+-------------------+  CFV                                                        Not well visualized  +---------+---------------+---------+-----------+----------+-------------------+  SFJ                                                        Not well visualized  +---------+---------------+---------+-----------+----------+-------------------+  FV Prox  Not well visualized  +---------+---------------+---------+-----------+----------+-------------------+  FV Mid    Partial         Yes       Yes                    Acute                +---------+---------------+---------+-----------+----------+-------------------+  FV Distal None            No        No                     Acute                +---------+---------------+---------+-----------+----------+-------------------+  PFV                                                        Not well visualized  +---------+---------------+---------+-----------+----------+-------------------+  POP       None            No        No                     Acute                +---------+---------------+---------+-----------+----------+-------------------+  PTV        None                                             Acute                +---------+---------------+---------+-----------+----------+-------------------+  PERO                                                       Not well visualized  +---------+---------------+---------+-----------+----------+-------------------+    Summary: RIGHT: Not evaluated secondary to contraction  LEFT: - Findings consistent with acute deep vein thrombosis involving the left femoral vein, left popliteal vein, and left posterior tibial veins. - Limited study secondary to contraction  *See table(s) above for measurements and observations. Electronically signed by Harold Barban MD on 02/13/2021 at 7:38:58 PM.    Final     Subjective: She is alert, conversant, eating some breakfast.  Denies pain   Discharge Exam: Vitals:   02/23/21 0520 02/23/21 0741  BP: (!) 146/79 (!) 153/80  Pulse: 69 79  Resp: 18 18  Temp: 97.7 F (36.5 C) 98.2 F (36.8 C)  SpO2: 100% 98%     General: Pt is alert, awake, not in acute distress Cardiovascular: RRR, S1/S2 +, no rubs, no gallops Respiratory: CTA bilaterally, no wheezing, no rhonchi Abdominal: Soft, NT, ND, bowel sounds + Extremities: no edema, no cyanosis    The results of significant diagnostics from this hospitalization (including imaging, microbiology, ancillary and laboratory) are listed below for reference.     Microbiology: Recent Results (from the past 240 hour(s))  SARS CORONAVIRUS 2 (TAT 6-24 HRS) Nasopharyngeal Nasopharyngeal Swab     Status: None   Collection Time: 02/21/21  6:11 PM  Specimen: Nasopharyngeal Swab  Result Value Ref Range Status   SARS Coronavirus 2 NEGATIVE NEGATIVE Final    Comment: (NOTE) SARS-CoV-2 target nucleic acids are NOT DETECTED.  The SARS-CoV-2 RNA is generally detectable in upper and lower respiratory specimens during the acute phase of infection. Negative results do not preclude SARS-CoV-2 infection, do not rule out co-infections  with other pathogens, and should not be used as the sole basis for treatment or other patient management decisions. Negative results must be combined with clinical observations, patient history, and epidemiological information. The expected result is Negative.  Fact Sheet for Patients: SugarRoll.be  Fact Sheet for Healthcare Providers: https://www.woods-mathews.com/  This test is not yet approved or cleared by the [place] FDA and  has been authorized for detection and/or diagnosis of SARS-CoV-2 by FDA under an Emergency Use Authorization (EUA). This EUA will remain  in effect (meaning this test can be used) for the duration of the COVID-19 declaration under Se ction 564(b)(1) of the Act, 21 U.S.C. section 360bbb-3(b)(1), unless the authorization is terminated or revoked sooner.  Performed at [facility], [place] [address], [place], [place] [address]      Labs: BNP (last 3 results) Recent Labs    02/12/21 1556  BNP 267.1*   Basic Metabolic Panel: Recent Labs  Lab 02/17/21 0227 02/18/21 0152 02/19/21 1044 02/21/21 0109 02/22/21 0408 02/23/21 0150  NA 133* 132* 134* 135 137 135  K 3.4* 4.2 3.6 3.1* 3.4* 3.9  CL 103 104 105 104 108 106  CO2 25 21* 23 23 21* 23  GLUCOSE 92 89 94 106* 94 88  BUN 7* 8 6* 8 7* 5*  CREATININE 0.54 0.52 0.48 0.57 0.58 0.54  CALCIUM 8.3* 8.4* 8.2* 8.3* 8.4* 8.3*  MG 2.0 1.8 1.8  --  1.6* 1.8  PHOS 2.1* 2.0* 2.5  --  1.8* 2.3*   Liver Function Tests: No results for input(s): AST, ALT, ALKPHOS, BILITOT, PROT, ALBUMIN in the last 168 hours. No results for input(s): LIPASE, AMYLASE in the last 168 hours. No results for input(s): AMMONIA in the last 168 hours. CBC: Recent Labs  Lab 02/18/21 0800 02/19/21 1044 02/22/21 0737  WBC 8.0 6.6 4.5  HGB 8.2* 9.3* 10.6*  HCT 24.6* 27.5* 31.6*  MCV 99.2 98.9 100.3*  PLT 214 271 275   Cardiac Enzymes: No results for input(s): CKTOTAL, CKMB,  CKMBINDEX, TROPONINI in the last 168 hours. BNP: Invalid input(s): POCBNP CBG: No results for input(s): GLUCAP in the last 168 hours. D-Dimer No results for input(s): DDIMER in the last 72 hours. Hgb A1c No results for input(s): HGBA1C in the last 72 hours. Lipid Profile No results for input(s): CHOL, HDL, LDLCALC, TRIG, CHOLHDL, LDLDIRECT in the last 72 hours. Thyroid function studies No results for input(s): TSH, T4TOTAL, T3FREE, THYROIDAB in the last 72 hours.  Invalid input(s): FREET3 Anemia work up No results for input(s): VITAMINB12, FOLATE, FERRITIN, TIBC, IRON, RETICCTPCT in the last 72 hours. Urinalysis    Component Value Date/Time   COLORURINE YELLOW 02/12/2021 [place] 02/12/2021 1556   LABSPEC >1.030 (H) 02/12/2021 1556   LABSPEC 1.030 09/24/2018 1253   PHURINE 5.5 02/12/2021 1556   GLUCOSEU 100 (A) 02/12/2021 1556   HGBUR NEGATIVE 02/12/2021 1556   BILIRUBINUR SMALL (A) 02/12/2021 1556   BILIRUBINUR negative 09/24/2018 1253   KETONESUR 15 (A) 02/12/2021 1556   PROTEINUR 100 (A) 02/12/2021 1556   NITRITE POSITIVE (A) 02/12/2021 1556   LEUKOCYTESUR NEGATIVE 02/12/2021  40   Sepsis Labs Invalid input(s): PROCALCITONIN,  WBC,  LACTICIDVEN Microbiology Recent Results (from the past 240 hour(s))  SARS CORONAVIRUS 2 (TAT 6-24 HRS) Nasopharyngeal Nasopharyngeal Swab     Status: None   Collection Time: 02/21/21  6:11 PM   Specimen: Nasopharyngeal Swab  Result Value Ref Range Status   SARS Coronavirus 2 NEGATIVE NEGATIVE Final    Comment: (NOTE) SARS-CoV-2 target nucleic acids are NOT DETECTED.  The SARS-CoV-2 RNA is generally detectable in upper and lower respiratory specimens during the acute phase of infection. Negative results do not preclude SARS-CoV-2 infection, do not rule out co-infections with other pathogens, and should not be used as the sole basis for treatment or other patient management decisions. Negative results must be combined  with clinical observations, patient history, and epidemiological information. The expected result is Negative.  Fact Sheet for Patients: SugarRoll.be  Fact Sheet for Healthcare Providers: https://www.woods-mathews.com/  This test is not yet approved or cleared by the [place] FDA and  has been authorized for detection and/or diagnosis of SARS-CoV-2 by FDA under an Emergency Use Authorization (EUA). This EUA will remain  in effect (meaning this test can be used) for the duration of the COVID-19 declaration under Se ction 564(b)(1) of the Act, 21 U.S.C. section 360bbb-3(b)(1), unless the authorization is terminated or revoked sooner.  Performed at [facility], [place] [address], [place], [place] [address]      Time coordinating discharge: 45 minutes  SIGNED:   Mercy Riding, MD  [facility]

## 2021-02-23 NOTE — Care Management Important Message (Signed)
Important Message  Patient Details  Name: [name] [name] MRN: [id number] Date of Birth: 07/14/1931   Medicare Important Message Given:  Yes     [name] 02/23/2021, 2:00 PM

## 2021-02-23 NOTE — Progress Notes (Signed)
Report called to [facility] heath care. Assisted patient in getting dress and cleaned up. Ivs removed, belongings gathered. Spoke to daughter about medications. Patient discharged.

## 2021-02-24 ENCOUNTER — Telehealth: Payer: Self-pay

## 2021-02-24 DIAGNOSIS — I82401 Acute embolism and thrombosis of unspecified deep veins of right lower extremity: Secondary | ICD-10-CM | POA: Diagnosis not present

## 2021-02-24 DIAGNOSIS — I480 Paroxysmal atrial fibrillation: Secondary | ICD-10-CM | POA: Diagnosis not present

## 2021-02-24 DIAGNOSIS — I10 Essential (primary) hypertension: Secondary | ICD-10-CM | POA: Diagnosis not present

## 2021-02-24 DIAGNOSIS — I2699 Other pulmonary embolism without acute cor pulmonale: Secondary | ICD-10-CM | POA: Diagnosis not present

## 2021-02-24 NOTE — Telephone Encounter (Signed)
Transition Care Management Follow-up Telephone Call Date of discharge and from where: 02/23/21 [facility] How have you been since you were released from the hospital? [facility] Any questions or concerns? No  Items Reviewed: Did the pt receive and understand the discharge instructions provided? Yes  Medications obtained and verified? Yes  Other? No  Any new allergies since your discharge? No  Dietary orders reviewed? No Do you have support at home? Yes   Home Care and Equipment/Supplies: Were home health services ordered? no  Functional Questionnaire: (I = Independent and D = Dependent) ADLs: I  Bathing/Dressing- D  Meal Prep- D  Eating- I  Maintaining continence- I  Transferring/Ambulation- D  Managing Meds- I  Follow up appointments reviewed:  PCP Hospital f/u appt confirmed? No  Pt. Is unable to walk at this time and has been released to a skilled nursing facility. They will call back to schedule here later once pt. Can walk again.  . Are transportation arrangements needed? No  If their condition worsens, is the pt aware to call PCP or go to the Emergency Dept.? Yes Was the patient provided with contact information for the PCP's office or ED? Yes Was to pt encouraged to call back with questions or concerns? Yes

## 2021-03-02 DIAGNOSIS — L89312 Pressure ulcer of right buttock, stage 2: Secondary | ICD-10-CM | POA: Diagnosis not present

## 2021-03-02 DIAGNOSIS — L89150 Pressure ulcer of sacral region, unstageable: Secondary | ICD-10-CM | POA: Diagnosis not present

## 2021-03-09 DIAGNOSIS — L89150 Pressure ulcer of sacral region, unstageable: Secondary | ICD-10-CM | POA: Diagnosis not present

## 2021-03-09 DIAGNOSIS — L89312 Pressure ulcer of right buttock, stage 2: Secondary | ICD-10-CM | POA: Diagnosis not present

## 2021-03-16 DIAGNOSIS — L89150 Pressure ulcer of sacral region, unstageable: Secondary | ICD-10-CM | POA: Diagnosis not present

## 2021-03-21 DIAGNOSIS — L89153 Pressure ulcer of sacral region, stage 3: Secondary | ICD-10-CM | POA: Diagnosis not present

## 2021-03-22 DIAGNOSIS — D649 Anemia, unspecified: Secondary | ICD-10-CM | POA: Diagnosis not present

## 2021-03-22 DIAGNOSIS — N179 Acute kidney failure, unspecified: Secondary | ICD-10-CM | POA: Diagnosis not present

## 2021-03-22 DIAGNOSIS — F0390 Unspecified dementia without behavioral disturbance: Secondary | ICD-10-CM | POA: Diagnosis not present

## 2021-04-01 DIAGNOSIS — M6281 Muscle weakness (generalized): Secondary | ICD-10-CM | POA: Diagnosis not present

## 2021-04-01 DIAGNOSIS — R051 Acute cough: Secondary | ICD-10-CM | POA: Diagnosis not present

## 2021-04-01 DIAGNOSIS — R0989 Other specified symptoms and signs involving the circulatory and respiratory systems: Secondary | ICD-10-CM | POA: Diagnosis not present

## 2021-04-01 DIAGNOSIS — F0390 Unspecified dementia without behavioral disturbance: Secondary | ICD-10-CM | POA: Diagnosis not present

## 2021-04-01 DIAGNOSIS — R5383 Other fatigue: Secondary | ICD-10-CM | POA: Diagnosis not present

## 2021-04-01 DIAGNOSIS — U071 COVID-19: Secondary | ICD-10-CM | POA: Diagnosis not present

## 2021-04-13 DIAGNOSIS — L89894 Pressure ulcer of other site, stage 4: Secondary | ICD-10-CM | POA: Diagnosis not present

## 2021-04-13 DIAGNOSIS — E43 Unspecified severe protein-calorie malnutrition: Secondary | ICD-10-CM | POA: Diagnosis not present

## 2021-04-13 DIAGNOSIS — Z7409 Other reduced mobility: Secondary | ICD-10-CM | POA: Diagnosis not present

## 2021-04-13 DIAGNOSIS — L89150 Pressure ulcer of sacral region, unstageable: Secondary | ICD-10-CM | POA: Diagnosis not present

## 2021-04-15 DIAGNOSIS — L89150 Pressure ulcer of sacral region, unstageable: Secondary | ICD-10-CM | POA: Diagnosis not present

## 2021-04-15 DIAGNOSIS — L89894 Pressure ulcer of other site, stage 4: Secondary | ICD-10-CM | POA: Diagnosis not present

## 2021-04-18 DIAGNOSIS — R630 Anorexia: Secondary | ICD-10-CM | POA: Diagnosis not present

## 2021-04-18 DIAGNOSIS — F0390 Unspecified dementia without behavioral disturbance: Secondary | ICD-10-CM | POA: Diagnosis not present

## 2021-04-20 DIAGNOSIS — M25569 Pain in unspecified knee: Secondary | ICD-10-CM | POA: Diagnosis not present

## 2021-04-20 DIAGNOSIS — R2689 Other abnormalities of gait and mobility: Secondary | ICD-10-CM | POA: Diagnosis not present

## 2021-04-20 DIAGNOSIS — R5381 Other malaise: Secondary | ICD-10-CM | POA: Diagnosis not present

## 2021-04-20 DIAGNOSIS — R4189 Other symptoms and signs involving cognitive functions and awareness: Secondary | ICD-10-CM | POA: Diagnosis not present

## 2021-04-25 ENCOUNTER — Other Ambulatory Visit: Payer: Self-pay

## 2021-04-25 ENCOUNTER — Inpatient Hospital Stay (HOSPITAL_COMMUNITY)
Admission: EM | Admit: 2021-04-25 | Discharge: 2021-04-28 | DRG: 592 | Disposition: A | Discharge status: Skilled Nursing Facility | Payer: Medicare HMO | Source: Skilled Nursing Facility | Attending: Internal Medicine | Admitting: Internal Medicine

## 2021-04-25 ENCOUNTER — Encounter (HOSPITAL_COMMUNITY): Payer: Self-pay | Admitting: *Deleted

## 2021-04-25 ENCOUNTER — Inpatient Hospital Stay (HOSPITAL_COMMUNITY): Payer: Medicare HMO

## 2021-04-25 ENCOUNTER — Emergency Department (HOSPITAL_COMMUNITY): Payer: Medicare HMO

## 2021-04-25 DIAGNOSIS — Z7901 Long term (current) use of anticoagulants: Secondary | ICD-10-CM | POA: Diagnosis not present

## 2021-04-25 DIAGNOSIS — L89894 Pressure ulcer of other site, stage 4: Secondary | ICD-10-CM | POA: Diagnosis not present

## 2021-04-25 DIAGNOSIS — L89154 Pressure ulcer of sacral region, stage 4: Secondary | ICD-10-CM | POA: Diagnosis not present

## 2021-04-25 DIAGNOSIS — Z7189 Other specified counseling: Secondary | ICD-10-CM

## 2021-04-25 DIAGNOSIS — I480 Paroxysmal atrial fibrillation: Secondary | ICD-10-CM | POA: Diagnosis not present

## 2021-04-25 DIAGNOSIS — E43 Unspecified severe protein-calorie malnutrition: Secondary | ICD-10-CM | POA: Diagnosis present

## 2021-04-25 DIAGNOSIS — L89893 Pressure ulcer of other site, stage 3: Secondary | ICD-10-CM | POA: Diagnosis not present

## 2021-04-25 DIAGNOSIS — L89150 Pressure ulcer of sacral region, unstageable: Secondary | ICD-10-CM | POA: Diagnosis not present

## 2021-04-25 DIAGNOSIS — I10 Essential (primary) hypertension: Secondary | ICD-10-CM | POA: Diagnosis not present

## 2021-04-25 DIAGNOSIS — I5032 Chronic diastolic (congestive) heart failure: Secondary | ICD-10-CM | POA: Diagnosis present

## 2021-04-25 DIAGNOSIS — R509 Fever, unspecified: Secondary | ICD-10-CM | POA: Diagnosis not present

## 2021-04-25 DIAGNOSIS — I110 Hypertensive heart disease with heart failure: Secondary | ICD-10-CM | POA: Diagnosis not present

## 2021-04-25 DIAGNOSIS — Z20822 Contact with and (suspected) exposure to covid-19: Secondary | ICD-10-CM | POA: Diagnosis present

## 2021-04-25 DIAGNOSIS — M549 Dorsalgia, unspecified: Secondary | ICD-10-CM | POA: Diagnosis not present

## 2021-04-25 DIAGNOSIS — E876 Hypokalemia: Secondary | ICD-10-CM | POA: Diagnosis present

## 2021-04-25 DIAGNOSIS — K5641 Fecal impaction: Secondary | ICD-10-CM | POA: Diagnosis present

## 2021-04-25 DIAGNOSIS — I700 Atherosclerosis of aorta: Secondary | ICD-10-CM | POA: Diagnosis not present

## 2021-04-25 DIAGNOSIS — Z86711 Personal history of pulmonary embolism: Secondary | ICD-10-CM

## 2021-04-25 DIAGNOSIS — F0280 Dementia in other diseases classified elsewhere without behavioral disturbance: Secondary | ICD-10-CM | POA: Diagnosis present

## 2021-04-25 DIAGNOSIS — R5381 Other malaise: Secondary | ICD-10-CM | POA: Diagnosis present

## 2021-04-25 DIAGNOSIS — G309 Alzheimer's disease, unspecified: Secondary | ICD-10-CM | POA: Diagnosis not present

## 2021-04-25 DIAGNOSIS — R109 Unspecified abdominal pain: Secondary | ICD-10-CM | POA: Diagnosis not present

## 2021-04-25 DIAGNOSIS — Z79899 Other long term (current) drug therapy: Secondary | ICD-10-CM

## 2021-04-25 DIAGNOSIS — Z86718 Personal history of other venous thrombosis and embolism: Secondary | ICD-10-CM

## 2021-04-25 DIAGNOSIS — R630 Anorexia: Secondary | ICD-10-CM | POA: Diagnosis not present

## 2021-04-25 DIAGNOSIS — Z7401 Bed confinement status: Secondary | ICD-10-CM | POA: Diagnosis not present

## 2021-04-25 DIAGNOSIS — Z809 Family history of malignant neoplasm, unspecified: Secondary | ICD-10-CM

## 2021-04-25 DIAGNOSIS — Z8249 Family history of ischemic heart disease and other diseases of the circulatory system: Secondary | ICD-10-CM

## 2021-04-25 DIAGNOSIS — E782 Mixed hyperlipidemia: Secondary | ICD-10-CM

## 2021-04-25 DIAGNOSIS — R532 Functional quadriplegia: Secondary | ICD-10-CM | POA: Diagnosis not present

## 2021-04-25 DIAGNOSIS — I739 Peripheral vascular disease, unspecified: Secondary | ICD-10-CM | POA: Diagnosis present

## 2021-04-25 DIAGNOSIS — I2699 Other pulmonary embolism without acute cor pulmonale: Secondary | ICD-10-CM

## 2021-04-25 DIAGNOSIS — Z66 Do not resuscitate: Secondary | ICD-10-CM | POA: Diagnosis not present

## 2021-04-25 DIAGNOSIS — K219 Gastro-esophageal reflux disease without esophagitis: Secondary | ICD-10-CM | POA: Diagnosis present

## 2021-04-25 DIAGNOSIS — E785 Hyperlipidemia, unspecified: Secondary | ICD-10-CM | POA: Diagnosis present

## 2021-04-25 DIAGNOSIS — R339 Retention of urine, unspecified: Secondary | ICD-10-CM | POA: Diagnosis present

## 2021-04-25 DIAGNOSIS — D638 Anemia in other chronic diseases classified elsewhere: Secondary | ICD-10-CM | POA: Diagnosis present

## 2021-04-25 DIAGNOSIS — K8020 Calculus of gallbladder without cholecystitis without obstruction: Secondary | ICD-10-CM | POA: Diagnosis present

## 2021-04-25 DIAGNOSIS — Z515 Encounter for palliative care: Secondary | ICD-10-CM | POA: Diagnosis not present

## 2021-04-25 DIAGNOSIS — R531 Weakness: Secondary | ICD-10-CM | POA: Diagnosis not present

## 2021-04-25 DIAGNOSIS — L8990 Pressure ulcer of unspecified site, unspecified stage: Secondary | ICD-10-CM | POA: Diagnosis present

## 2021-04-25 DIAGNOSIS — K5900 Constipation, unspecified: Secondary | ICD-10-CM

## 2021-04-25 DIAGNOSIS — R627 Adult failure to thrive: Secondary | ICD-10-CM | POA: Diagnosis not present

## 2021-04-25 DIAGNOSIS — F0390 Unspecified dementia without behavioral disturbance: Secondary | ICD-10-CM | POA: Diagnosis not present

## 2021-04-25 LAB — CBC WITH DIFFERENTIAL/PLATELET
Abs Immature Granulocytes: 0.05 10*3/uL (ref 0.00–0.07)
Basophils Absolute: 0 10*3/uL (ref 0.0–0.1)
Basophils Relative: 0 %
Eosinophils Absolute: 0.1 10*3/uL (ref 0.0–0.5)
Eosinophils Relative: 1 %
HCT: 37.7 % (ref 36.0–46.0)
Hemoglobin: 12 g/dL (ref 12.0–15.0)
Immature Granulocytes: 1 %
Lymphocytes Relative: 23 %
Lymphs Abs: 2 10*3/uL (ref 0.7–4.0)
MCH: 30.8 pg (ref 26.0–34.0)
MCHC: 31.8 g/dL (ref 30.0–36.0)
MCV: 96.7 fL (ref 80.0–100.0)
Monocytes Absolute: 0.6 10*3/uL (ref 0.1–1.0)
Monocytes Relative: 6 %
Neutro Abs: 6.3 10*3/uL (ref 1.7–7.7)
Neutrophils Relative %: 69 %
Platelets: 329 10*3/uL (ref 150–400)
RBC: 3.9 MIL/uL (ref 3.87–5.11)
RDW: 13.6 % (ref 11.5–15.5)
WBC: 9 10*3/uL (ref 4.0–10.5)
nRBC: 0 % (ref 0.0–0.2)

## 2021-04-25 LAB — COMPREHENSIVE METABOLIC PANEL
ALT: 20 U/L (ref 0–44)
AST: 24 U/L (ref 15–41)
Albumin: 2.7 g/dL — ABNORMAL LOW (ref 3.5–5.0)
Alkaline Phosphatase: 88 U/L (ref 38–126)
Anion gap: 9 (ref 5–15)
BUN: 11 mg/dL (ref 8–23)
CO2: 33 mmol/L — ABNORMAL HIGH (ref 22–32)
Calcium: 8.9 mg/dL (ref 8.9–10.3)
Chloride: 100 mmol/L (ref 98–111)
Creatinine, Ser: <0.3 mg/dL — ABNORMAL LOW (ref 0.44–1.00)
Glucose, Bld: 101 mg/dL — ABNORMAL HIGH (ref 70–99)
Potassium: 2.5 mmol/L — CL (ref 3.5–5.1)
Sodium: 142 mmol/L (ref 135–145)
Total Bilirubin: 0.6 mg/dL (ref 0.3–1.2)
Total Protein: 7.4 g/dL (ref 6.5–8.1)

## 2021-04-25 LAB — URINALYSIS, ROUTINE W REFLEX MICROSCOPIC
Bacteria, UA: NONE SEEN
Bilirubin Urine: NEGATIVE
Glucose, UA: NEGATIVE mg/dL
Hgb urine dipstick: NEGATIVE
Ketones, ur: NEGATIVE mg/dL
Leukocytes,Ua: NEGATIVE
Nitrite: NEGATIVE
Protein, ur: 30 mg/dL — AB
Specific Gravity, Urine: 1.024 (ref 1.005–1.030)
pH: 7 (ref 5.0–8.0)

## 2021-04-25 LAB — TSH: TSH: 1.431 u[IU]/mL (ref 0.350–4.500)

## 2021-04-25 LAB — PROTIME-INR
INR: 1.7 — ABNORMAL HIGH (ref 0.8–1.2)
Prothrombin Time: 20 seconds — ABNORMAL HIGH (ref 11.4–15.2)

## 2021-04-25 LAB — MAGNESIUM: Magnesium: 1.9 mg/dL (ref 1.7–2.4)

## 2021-04-25 LAB — LACTIC ACID, PLASMA: Lactic Acid, Venous: 1.5 mmol/L (ref 0.5–1.9)

## 2021-04-25 LAB — RESP PANEL BY RT-PCR (FLU A&B, COVID) ARPGX2
Influenza A by PCR: NEGATIVE
Influenza B by PCR: NEGATIVE
SARS Coronavirus 2 by RT PCR: NEGATIVE

## 2021-04-25 MED ORDER — POTASSIUM CHLORIDE 10 MEQ/100ML IV SOLN
10.0000 meq | INTRAVENOUS | Status: AC
  Administered 2021-04-25 – 2021-04-26 (×5): 10 meq via INTRAVENOUS
  Filled 2021-04-25 (×5): qty 100

## 2021-04-25 MED ORDER — IOHEXOL 300 MG/ML  SOLN
100.0000 mL | Freq: Once | INTRAMUSCULAR | Status: AC | PRN
  Administered 2021-04-25: 80 mL via INTRAVENOUS

## 2021-04-25 MED ORDER — LACTATED RINGERS IV BOLUS (SEPSIS)
500.0000 mL | Freq: Once | INTRAVENOUS | Status: AC
  Administered 2021-04-25: 500 mL via INTRAVENOUS

## 2021-04-25 NOTE — Consult Note (Signed)
CC: Sacral decubitus ulcer  Requesting physician: Dr. Godfrey Pick  HPI: [name] [name] is an 88 y.o. female hx significant dementia, HTN, HLD, massive PEs on Eliquis before whom is currently being admitted to the medicine service with what was described to the emergency department as a worsening decubitus ulcer.  She has a history of significant dementia which by documentation is quite advanced and has been residing in a skilled nursing facility undergoing sacral wound care.  She has been in this location for a few months but the appearance of the wound had worsened over the last week or 2.  By report, the wound care doctor had mentioned that she needed to be evaluated for surgical debridement at the hospital.  There are no reported history of fever/chills or purulent drainage.  Currently there is no family at bedside.  We were unsuccessful at reaching her daughter via phone.  Much of history obtained through chart review  Past Medical History:  Diagnosis Date   Allergy    Arthritis    Asthma    Cataract    Diverticulosis    Dyslipidemia    GERD (gastroesophageal reflux disease)    Hemorrhoids    History of atrial fibrillation    Hypertension    Menopause    Obesity    PVD (peripheral vascular disease) ([place])     Past Surgical History:  Procedure Laterality Date   ABDOMINAL AORTAGRAM N/A 02/09/2012   Procedure: ABDOMINAL Maxcine Ham;  Surgeon: Elam Dutch, MD;  Location: [facility] CATH LAB;  Service: Cardiovascular;  Laterality: N/A;   COLONOSCOPY  02/2004   EYE SURGERY     CATARACT (BILATERAL)   HERNIA REPAIR     abdominal x 2   IR THROMBECT PRIM MECH INIT (INCLU) MOD SED  02/13/2021   IR US GUIDE VASC ACCESS RIGHT  02/13/2021   RADIOLOGY WITH ANESTHESIA N/A 02/12/2021   Procedure: IR WITH ANESTHESIA;  Surgeon: Luanne Bras, MD;  Location: [facility];  Service: Radiology;  Laterality: N/A;   right femoral to anterior tibial bypass  2004   SHOULDER SURGERY     age 76 due to  dislocation   TEMPORAL ARTERY BIOPSY / LIGATION      Family History  Problem Relation Age of Onset   Cancer Sister    Mental retardation Paternal Aunt    Heart disease Son        died age 33yo   Hyperlipidemia Son    Heart attack Son     Social:  reports that she has never smoked. She has never used smokeless tobacco. She reports that she does not drink alcohol and does not use drugs.  Allergies: No Known Allergies  Medications: I have reviewed the patient's current medications.  Results for orders placed or performed during the hospital encounter of 04/25/21 (from the past 48 hour(s))  Urinalysis, Routine w reflex microscopic Urine, In & Out Cath     Status: Abnormal   Collection Time: 04/25/21  3:24 PM  Result Value Ref Range   Color, Urine AMBER (A) YELLOW    Comment: BIOCHEMICALS MAY BE AFFECTED BY COLOR   APPearance CLEAR CLEAR   Specific Gravity, Urine 1.024 1.005 - 1.030   pH 7.0 5.0 - 8.0   Glucose, UA NEGATIVE NEGATIVE mg/dL   Hgb urine dipstick NEGATIVE NEGATIVE   Bilirubin Urine NEGATIVE NEGATIVE   Ketones, ur NEGATIVE NEGATIVE mg/dL   Protein, ur 30 (A) NEGATIVE mg/dL   Nitrite NEGATIVE NEGATIVE  Leukocytes,Ua NEGATIVE NEGATIVE   RBC / HPF 0-5 0 - 5 RBC/hpf   WBC, UA 6-10 0 - 5 WBC/hpf   Bacteria, UA NONE SEEN NONE SEEN   Squamous Epithelial / LPF 0-5 0 - 5   Mucus PRESENT    Budding Yeast PRESENT    Hyaline Casts, UA PRESENT     Comment: Performed at [facility], [place] [address] [place], [place] [address]  Lactic acid, plasma     Status: None   Collection Time: 04/25/21  4:18 PM  Result Value Ref Range   Lactic Acid, Venous 1.5 0.5 - 1.9 mmol/L    Comment: Performed at [facility], [place] [address], [place], [place] [address]  Comprehensive metabolic panel     Status: Abnormal   Collection Time: 04/25/21  4:18 PM  Result Value Ref Range   Sodium 142 135 - 145 mmol/L   Potassium 2.5 (LL) 3.5 - 5.1 mmol/L     Comment: CRITICAL RESULT CALLED TO, READ BACK BY AND VERIFIED WITH: BRATU,D. EMTP AT 1710 04/25/21 MULLINS,T    Chloride 100 98 - 111 mmol/L   CO2 33 (H) 22 - 32 mmol/L   Glucose, Bld 101 (H) 70 - 99 mg/dL    Comment: Glucose reference range applies only to samples taken after fasting for at least 8 hours.   BUN 11 8 - 23 mg/dL   Creatinine, Ser <0.30 (L) 0.44 - 1.00 mg/dL   Calcium 8.9 8.9 - 10.3 mg/dL   Total Protein 7.4 6.5 - 8.1 g/dL   Albumin 2.7 (L) 3.5 - 5.0 g/dL   AST 24 15 - 41 U/L   ALT 20 0 - 44 U/L   Alkaline Phosphatase 88 38 - 126 U/L   Total Bilirubin 0.6 0.3 - 1.2 mg/dL   GFR, Estimated NOT CALCULATED >60 mL/min    Comment: (NOTE) Calculated using the CKD-EPI Creatinine Equation (2021)    Anion gap 9 5 - 15    Comment: Performed at [facility], [place] [address] [place], [place] [address]  CBC WITH DIFFERENTIAL     Status: None   Collection Time: 04/25/21  4:18 PM  Result Value Ref Range   WBC 9.0 4.0 - 10.5 K/uL   RBC 3.90 3.87 - 5.11 MIL/uL   Hemoglobin 12.0 12.0 - 15.0 g/dL   HCT 37.7 36.0 - 46.0 %   MCV 96.7 80.0 - 100.0 fL   MCH 30.8 26.0 - 34.0 pg   MCHC 31.8 30.0 - 36.0 g/dL   RDW 13.6 11.5 - 15.5 %   Platelets 329 150 - 400 K/uL   nRBC 0.0 0.0 - 0.2 %   Neutrophils Relative % 69 %   Neutro Abs 6.3 1.7 - 7.7 K/uL   Lymphocytes Relative 23 %   Lymphs Abs 2.0 0.7 - 4.0 K/uL   Monocytes Relative 6 %   Monocytes Absolute 0.6 0.1 - 1.0 K/uL   Eosinophils Relative 1 %   Eosinophils Absolute 0.1 0.0 - 0.5 K/uL   Basophils Relative 0 %   Basophils Absolute 0.0 0.0 - 0.1 K/uL   Immature Granulocytes 1 %   Abs Immature Granulocytes 0.05 0.00 - 0.07 K/uL    Comment: Performed at [facility], [place] [address] [place], [place] [address]  Protime-INR     Status: Abnormal   Collection Time: 04/25/21  4:18 PM  Result Value Ref Range   Prothrombin Time 20.0 (H) 11.4 - 15.2 seconds  INR 1.7 (H) 0.8 - 1.2    Comment:  (NOTE) INR goal varies based on device and disease states. Performed at [facility], [place] [address] [place], [place] [address]   Magnesium     Status: None   Collection Time: 04/25/21  4:18 PM  Result Value Ref Range   Magnesium 1.9 1.7 - 2.4 mg/dL    Comment: Performed at [facility], [place] [address], [place], [place] [address]    DG Chest Port 1 View  Result Date: 04/25/2021 CLINICAL DATA:  Questionable sepsis - evaluate for abnormality EXAM: PORTABLE CHEST 1 VIEW COMPARISON:  Chest x-ray 02/12/2021 FINDINGS: The right heart border is not visualized. Otherwise the heart and mediastinal contours are unchanged. The right heart border aortic calcification. Bilateral apices not well visualized due to overlying mandible. No definite focal consolidation. No pulmonary edema. No pleural effusion. No pneumothorax. No acute osseous abnormality. IMPRESSION: The right heart border is not visualized. This may be due to right middle lobe disease versus patient positioning. Limited evaluation: Bilateral apices not well visualized due to overlying mandible. Recommend repeat PA and lateral view of the chest for further evaluation. Electronically Signed   By: Iven Finn M.D.   On: 04/25/2021 16:11    ROS - unable to perform 2/2 condition of the patient's dementia  PE Blood pressure (!) 138/115, pulse 64, temperature 99.6 F (37.6 C), temperature source Rectal, resp. rate 15, SpO2 95 %. Constitutional: NAD; conversant; no deformities Eyes: Moist conjunctiva; no lid lag; anicteric; PERRL Neck: Trachea midline; no thyromegaly Lungs: Normal respiratory effort; no tactile fremitus CV: RRR; no palpable thrills; no pitting edema GI: Abd soft, Nondistended; no palpable hepatosplenomegaly MSK: Limited range of motion of extremities; no clubbing/cyanosis; sacral decubitus ulcer with dry eschar along base. No erythema, fluctuance or purulent drainage. Psychiatric:  Appropriate affect; alert and oriented x3 Lymphatic: No palpable cervical or axillary lymphadenopathy  Results for orders placed or performed during the hospital encounter of 04/25/21 (from the past 48 hour(s))  Urinalysis, Routine w reflex microscopic Urine, In & Out Cath     Status: Abnormal   Collection Time: 04/25/21  3:24 PM  Result Value Ref Range   Color, Urine AMBER (A) YELLOW    Comment: BIOCHEMICALS MAY BE AFFECTED BY COLOR   APPearance CLEAR CLEAR   Specific Gravity, Urine 1.024 1.005 - 1.030   pH 7.0 5.0 - 8.0   Glucose, UA NEGATIVE NEGATIVE mg/dL   Hgb urine dipstick NEGATIVE NEGATIVE   Bilirubin Urine NEGATIVE NEGATIVE   Ketones, ur NEGATIVE NEGATIVE mg/dL   Protein, ur 30 (A) NEGATIVE mg/dL   Nitrite NEGATIVE NEGATIVE   Leukocytes,Ua NEGATIVE NEGATIVE   RBC / HPF 0-5 0 - 5 RBC/hpf   WBC, UA 6-10 0 - 5 WBC/hpf   Bacteria, UA NONE SEEN NONE SEEN   Squamous Epithelial / LPF 0-5 0 - 5   Mucus PRESENT    Budding Yeast PRESENT    Hyaline Casts, UA PRESENT     Comment: Performed at [facility], [place] [address], [place], [place] [address]  Lactic acid, plasma     Status: None   Collection Time: 04/25/21  4:18 PM  Result Value Ref Range   Lactic Acid, Venous 1.5 0.5 - 1.9 mmol/L    Comment: Performed at [facility], [place] [address] [place], [place] [address]  Comprehensive metabolic panel     Status: Abnormal   Collection Time: 04/25/21  4:18  PM  Result Value Ref Range   Sodium 142 135 - 145 mmol/L   Potassium 2.5 (LL) 3.5 - 5.1 mmol/L    Comment: CRITICAL RESULT CALLED TO, READ BACK BY AND VERIFIED WITH: BRATU,D. EMTP AT 1710 04/25/21 MULLINS,T    Chloride 100 98 - 111 mmol/L   CO2 33 (H) 22 - 32 mmol/L   Glucose, Bld 101 (H) 70 - 99 mg/dL    Comment: Glucose reference range applies only to samples taken after fasting for at least 8 hours.   BUN 11 8 - 23 mg/dL   Creatinine, Ser <0.30 (L) 0.44 - 1.00 mg/dL   Calcium 8.9 8.9 -  10.3 mg/dL   Total Protein 7.4 6.5 - 8.1 g/dL   Albumin 2.7 (L) 3.5 - 5.0 g/dL   AST 24 15 - 41 U/L   ALT 20 0 - 44 U/L   Alkaline Phosphatase 88 38 - 126 U/L   Total Bilirubin 0.6 0.3 - 1.2 mg/dL   GFR, Estimated NOT CALCULATED >60 mL/min    Comment: (NOTE) Calculated using the CKD-EPI Creatinine Equation (2021)    Anion gap 9 5 - 15    Comment: Performed at [facility], [place] [address] [place], [place] [address]  CBC WITH DIFFERENTIAL     Status: None   Collection Time: 04/25/21  4:18 PM  Result Value Ref Range   WBC 9.0 4.0 - 10.5 K/uL   RBC 3.90 3.87 - 5.11 MIL/uL   Hemoglobin 12.0 12.0 - 15.0 g/dL   HCT 37.7 36.0 - 46.0 %   MCV 96.7 80.0 - 100.0 fL   MCH 30.8 26.0 - 34.0 pg   MCHC 31.8 30.0 - 36.0 g/dL   RDW 13.6 11.5 - 15.5 %   Platelets 329 150 - 400 K/uL   nRBC 0.0 0.0 - 0.2 %   Neutrophils Relative % 69 %   Neutro Abs 6.3 1.7 - 7.7 K/uL   Lymphocytes Relative 23 %   Lymphs Abs 2.0 0.7 - 4.0 K/uL   Monocytes Relative 6 %   Monocytes Absolute 0.6 0.1 - 1.0 K/uL   Eosinophils Relative 1 %   Eosinophils Absolute 0.1 0.0 - 0.5 K/uL   Basophils Relative 0 %   Basophils Absolute 0.0 0.0 - 0.1 K/uL   Immature Granulocytes 1 %   Abs Immature Granulocytes 0.05 0.00 - 0.07 K/uL    Comment: Performed at [facility], [place] [address], [place], [place] [address]  Protime-INR     Status: Abnormal   Collection Time: 04/25/21  4:18 PM  Result Value Ref Range   Prothrombin Time 20.0 (H) 11.4 - 15.2 seconds   INR 1.7 (H) 0.8 - 1.2    Comment: (NOTE) INR goal varies based on device and disease states. Performed at [facility], [place] [address] [place], [place] [address]   Magnesium     Status: None   Collection Time: 04/25/21  4:18 PM  Result Value Ref Range   Magnesium 1.9 1.7 - 2.4 mg/dL    Comment: Performed at [facility], [place] [address], [place], [place] [address]    DG Chest Port 1  View  Result Date: 04/25/2021 CLINICAL DATA:  Questionable sepsis - evaluate for abnormality EXAM: PORTABLE CHEST 1 VIEW COMPARISON:  Chest x-ray 02/12/2021 FINDINGS: The right heart border is not visualized. Otherwise the heart and mediastinal contours are unchanged. The right heart border aortic calcification. Bilateral apices not well visualized due  to overlying mandible. No definite focal consolidation. No pulmonary edema. No pleural effusion. No pneumothorax. No acute osseous abnormality. IMPRESSION: The right heart border is not visualized. This may be due to right middle lobe disease versus patient positioning. Limited evaluation: Bilateral apices not well visualized due to overlying mandible. Recommend repeat PA and lateral view of the chest for further evaluation. Electronically Signed   By: Iven Finn M.D.   On: 04/25/2021 16:11    I have personally reviewed the relevant notes in Epic, CBC  A/P: [name] [name] is an 88 y.o. female with dementia, massive PEs on Eliquis   -Ok for heparin gtt from our perspective but would hold Eliquis for time being if feasible -Needs goals of care discussion/palliative care given her advanced dementia and progressive and significant decline -No urgent/emergent surgical needs at this juncture.  There is no evidence of a wound infection.  She has a dry eschar.  This would be technically amenable to surgical debridement, however, would await further discussions with family and goals of care moving forward prior to proceeding with this  Moderate MDM  Nadeen Landau, MD [facility], [place]

## 2021-04-25 NOTE — Assessment & Plan Note (Signed)
Chronic stable not on blood pressure meds

## 2021-04-25 NOTE — ED Notes (Signed)
EDP notified pt is a difficult stick. IV team consult placed at this time.

## 2021-04-25 NOTE — Assessment & Plan Note (Signed)
-   will replace and repeat in AM,  check magnesium level and replace as needed ° °

## 2021-04-25 NOTE — Assessment & Plan Note (Signed)
Suspect underlying osteomyelitis obtain CT to further evaluate her deep is ulceration care.  At this point no evidence of sepsis we will hold off on antibiotics. Pending results may need ID consult. Appreciate general surgery consult patient is fragile day of recent large  PE requiring thrombectomy and on chronic anticoagulation If general surgery feels that patient would benefit from debridement would need to switch to heparin.  Discussed with family who would like to have further discussion with general surgery prior to proceeding as there is concerns of patient able to tolerate it given her dementia and fragility

## 2021-04-25 NOTE — Assessment & Plan Note (Signed)
Rate controlled for now continue Eliquis

## 2021-04-25 NOTE — Assessment & Plan Note (Signed)
For now continue Eliquis if general surgery feels the patient would be a candidate for debridement with need to transition on heparin

## 2021-04-25 NOTE — Assessment & Plan Note (Signed)
Chronic restart Zocor when able to tolerate

## 2021-04-25 NOTE — Assessment & Plan Note (Signed)
Check check prealbumin order nutritional consult

## 2021-04-25 NOTE — ED Provider Notes (Signed)
[facility] DEPT Provider Note   CSN: [id number] Arrival date & time: 04/25/21  1415     History  Chief Complaint  Patient presents with   Pressure Sores    [name] [name] is a 88 y.o. female.  HPI Patient with history of dementia presenting from nursing facility for concerns of worsening pressure sores.  Additional medical history includes HTN, HLD, OSA, GERD, PE (on Eliquis), and malnutrition.  History from patient is unavailable due to dementia.  No other history is provided by EMS.  I spoke with nursing staff at patient's facility, [facility].  Per nursing staff, patient has had a worsening of her sacral ulcer over the past week.  She has been undergoing daily wet-to-dry dressing changes.  Wound has been managed by a wound care doctor on site.  Wound care doctor instructed the staff to send the patient to the ED, feeling that she needs surgical debridement.    Home Medications Prior to Admission medications   Medication Sig Start Date End Date Taking? Authorizing Provider  acetaminophen (TYLENOL) 500 MG tablet Take 500 mg by mouth every 12 (twelve) hours. Knee pain   Yes [provider]  Amino Acids-Protein Hydrolys (FEEDING SUPPLEMENT, PRO-STAT SUGAR FREE 64,) LIQD Take 30 mLs by mouth 2 (two) times daily. With 4 oz. juice   Yes [provider]  apixaban (ELIQUIS) 5 MG TABS tablet Take 1 tablet (5 mg total) by mouth 2 (two) times daily. 02/22/21  Yes Regalado, Belkys A, MD  collagenase (SANTYL) ointment Apply 1 application topically daily.   Yes [provider]  feeding supplement (ENSURE ENLIVE / ENSURE PLUS) LIQD Take 237 mLs by mouth 3 (three) times daily between meals. 02/22/21  Yes Regalado, Belkys A, MD  ferrous sulfate 325 (65 FE) MG tablet Take 1 tablet (325 mg total) by mouth daily with breakfast. 02/23/21  Yes Regalado, Belkys A, MD  magnesium oxide (MAG-OX) 400 (240 Mg) MG tablet Take 1 tablet (400 mg  total) by mouth 2 (two) times daily. 02/22/21  Yes Regalado, Belkys A, MD  memantine (NAMENDA XR) 14 MG CP24 24 hr capsule Take 1 capsule (14 mg total) by mouth daily. 07/13/20  Yes Ward Givens, NP  mirtazapine (REMERON) 7.5 MG tablet Take 7.5 mg by mouth at bedtime.   Yes [provider]  Multiple Vitamin (MULITIVITAMIN WITH MINERALS) TABS Take 1 tablet by mouth daily.   Yes [provider]  pantoprazole (PROTONIX) 40 MG tablet Take 1 tablet (40 mg total) by mouth daily. 02/23/21  Yes Regalado, Belkys A, MD  phosphorus (K PHOS NEUTRAL) 155-852-130 MG tablet Take 2 tablets (500 mg total) by mouth 2 (two) times daily. 02/22/21  Yes Regalado, Belkys A, MD  Pyridoxine HCl (VITAMIN B-6 PO) Take 100 mg by mouth daily.   Yes [provider]  simvastatin (ZOCOR) 40 MG tablet Take 1 tablet (40 mg total) by mouth at bedtime. 10/12/20  Yes Denita Lung, MD  acetaminophen (TYLENOL) 650 MG CR tablet Take 650-1,300 mg by mouth every 8 (eight) hours as needed (arthritis pain). Patient not taking: Reported on 04/25/2021    [provider]  docusate sodium (COLACE) 100 MG capsule Take 1 capsule (100 mg total) by mouth 2 (two) times daily as needed for mild constipation. Patient not taking: Reported on 04/25/2021 02/22/21   Niel Hummer A, MD  triamcinolone cream (KENALOG) 0.5 % APPLY TOPICALLY 3 TIMES  DAILY Patient not taking: Reported on 04/25/2021 06/10/19  Denita Lung, MD      Allergies    Patient has no known allergies.    Review of Systems   Review of Systems  Unable to perform ROS: Mental status change   Physical Exam Updated Vital Signs BP 132/69    Pulse 95    Temp 99.6 F (37.6 C) (Rectal)    Resp 20    SpO2 99%  Physical Exam Vitals and nursing note reviewed.  Constitutional:      General: She is not in acute distress.    Appearance: She is well-developed and underweight. She is ill-appearing (Chronically). She is not toxic-appearing or  diaphoretic.  HENT:     Head: Normocephalic and atraumatic.     Right Ear: External ear normal.     Left Ear: External ear normal. There is no impacted cerumen.     Nose: Nose normal.     Mouth/Throat:     Mouth: Mucous membranes are moist.     Pharynx: Oropharynx is clear.  Eyes:     Conjunctiva/sclera: Conjunctivae normal.  Cardiovascular:     Rate and Rhythm: Normal rate. Rhythm irregular.     Heart sounds: No murmur heard. Pulmonary:     Effort: Pulmonary effort is normal. No respiratory distress.     Breath sounds: Normal breath sounds. No wheezing or rales.  Abdominal:     General: Abdomen is flat.     Palpations: Abdomen is soft.     Tenderness: There is no abdominal tenderness.  Musculoskeletal:        General: No swelling or tenderness.     Cervical back: Neck supple. No rigidity.     Right lower leg: No edema.     Left lower leg: No edema.  Skin:    General: Skin is warm and dry.     Capillary Refill: Capillary refill takes less than 2 seconds.  Neurological:     General: No focal deficit present.     Mental Status: Mental status is at baseline.     GCS: GCS eye subscore is 4. GCS verbal subscore is 4. GCS motor subscore is 5.     Cranial Nerves: No cranial nerve deficit or facial asymmetry.     ED Results / Procedures / Treatments   Labs (all labs ordered are listed, but only abnormal results are displayed) Labs Reviewed  COMPREHENSIVE METABOLIC PANEL - Abnormal; Notable for the following components:      Result Value   Potassium 2.5 (*)    CO2 33 (*)    Glucose, Bld 101 (*)    Creatinine, Ser <0.30 (*)    Albumin 2.7 (*)    All other components within normal limits  PROTIME-INR - Abnormal; Notable for the following components:   Prothrombin Time 20.0 (*)    INR 1.7 (*)    All other components within normal limits  URINALYSIS, ROUTINE W REFLEX MICROSCOPIC - Abnormal; Notable for the following components:   Color, Urine AMBER (*)    Protein, ur 30 (*)     All other components within normal limits  RESP PANEL BY RT-PCR (FLU A&B, COVID) ARPGX2  CULTURE, BLOOD (ROUTINE X 2)  CULTURE, BLOOD (ROUTINE X 2)  LACTIC ACID, PLASMA  CBC WITH DIFFERENTIAL/PLATELET  MAGNESIUM  TSH  BASIC METABOLIC PANEL  CK  HEPATIC FUNCTION PANEL  PHOSPHORUS  PREALBUMIN    EKG EKG Interpretation  Date/Time:  Monday April 25 2021 16:45:58 EST Ventricular Rate:  89 PR Interval:  153  QRS Duration: 109 QT Interval:  381 QTC Calculation: 445 R Axis:   7 Text Interpretation: Unknown rhythm, irregular rate Abnormal R-wave progression, early transition Confirmed by Godfrey Pick [phone]) on 04/25/2021 5:04:39 PM  Radiology CT ABDOMEN PELVIS W CONTRAST  Result Date: 04/25/2021 CLINICAL DATA:  Abdominal pain with history of dementia. Unable to provide history. EXAM: CT ABDOMEN AND PELVIS WITH CONTRAST TECHNIQUE: Multidetector CT imaging of the abdomen and pelvis was performed using the standard protocol following bolus administration of intravenous contrast. RADIATION DOSE REDUCTION: This exam was performed according to the departmental dose-optimization program which includes automated exposure control, adjustment of the mA and/or kV according to patient size and/or use of iterative reconstruction technique. CONTRAST:  27mL OMNIPAQUE IOHEXOL 300 MG/ML  SOLN COMPARISON:  CTA chest, abdomen and pelvis 02/12/2021 FINDINGS: Factors affecting image quality: Beam hardening from the patient's arms in the field and lower extremity flexion contractures. Lower chest: Calcified granuloma again noted in the left lower lobe. There are new trace pleural effusions compared to the prior study. Cardiomegaly is again noted but decreased in the interval. Small pericardial effusion is also new, measuring up to 9 mm. Calcifications circumflex coronary artery. Lung bases are clear of infiltrates. There is bronchial thickening in the lower lobes consistent with bronchitis and/or small airways  disease. Hepatobiliary: The liver is unremarkable. There are small stones layering in the gallbladder with mild gallbladder distention. There is mild intrahepatic periportal edema, most typically due to fluid overload. There is no biliary dilatation. Pancreas: Partially atrophic and otherwise unremarkable. Spleen: No mass enhancement or splenomegaly. Adrenals/Urinary Tract: There are left renal cysts up to 3 cm. There are few additional left renal cortical subcentimeter hypodensities which are too small to characterize. There is a 1.1 cm indeterminate heterogeneous lesion of the lower lateral right kidney which could be a complex cyst or a mass (series 3 axial 43). There are no stones or hydronephrosis. There is no adrenal mass. Stomach/Bowel: No dilatation or wall thickening of the small bowel and stomach. An appendix is not seen in this patient. There is moderate retained stool in the transverse colon, moderate impacted stool increased in the rectum with scattered rectal wall pneumatosis. There is perirectal fluid and edema which could be inflammatory or congestive. Vascular/Lymphatic: There is mild-to-moderate aortoiliac atherosclerosis without AAA. No adenopathy is visible. Reproductive: There are calcified fibroids in the uterus, which is positioned to the left. No other adnexal abnormality is visible. There are numerous pelvic phleboliths. Other: There is interval increased body wall anasarca throughout, increased mesenteric edema throughout. There is mild scattered ascites in the mesenteric folds and posterior deep pelvis.There is a small umbilical fat hernia. Musculoskeletal: A sacral decubitus ulcer has developed since the prior study and measures 6.2 cm in height, 2.8 cm in depth and 5.1 cm transverse, located right of the midline with extension down to bone. No destructive bone lesion is seen. There is osteopenia with degenerative changes of the spine, advanced L4-5 facet hypertrophy and disc collapse and  grade 1, borderline grade 2 L4-5 spondylolisthesis with spinal canal and severe vertical foraminal stenosis. No aggressive bone lesion is seen. IMPRESSION: 1. Constipation is again noted without evidence of small-bowel obstruction, but there is increased rectal fecal impaction, with scattered rectal wall pneumatosis which could be due to wall ischemia or stercoral proctitis. 2. Increased body wall and mesenteric edema, small amount of ascites in the abdomen and pelvis. This could be congestive and/or due to malnutrition/hepatic dysfunction. 3. Increased intrahepatic periportal edema,  most likely related to fluid overload or congestive, alternatively could be hepatitis. 4. Cholelithiasis and gallbladder distention without wall thickening or biliary dilatation. 5. Left renal cysts, and a 1.1 cm heterogeneous lesion of the lower lateral right kidney which could be a complex cyst or solid mass. Ultrasound or MRI follow-up recommended. 6. Trace pleural effusions new from prior study, with small new pericardial effusion. Cardiomegaly. 7. Right paramidline sacral decubitus ulcer extending down to bone, also new from 63/89/3734. 8. Umbilical fat hernia. 9. Bilateral lower lobe bronchitis and/or small airways disease. Electronically Signed   By: Telford Nab M.D.   On: 04/25/2021 22:04   DG Chest Port 1 View  Result Date: 04/25/2021 CLINICAL DATA:  Questionable sepsis - evaluate for abnormality EXAM: PORTABLE CHEST 1 VIEW COMPARISON:  Chest x-ray 02/12/2021 FINDINGS: The right heart border is not visualized. Otherwise the heart and mediastinal contours are unchanged. The right heart border aortic calcification. Bilateral apices not well visualized due to overlying mandible. No definite focal consolidation. No pulmonary edema. No pleural effusion. No pneumothorax. No acute osseous abnormality. IMPRESSION: The right heart border is not visualized. This may be due to right middle lobe disease versus patient positioning.  Limited evaluation: Bilateral apices not well visualized due to overlying mandible. Recommend repeat PA and lateral view of the chest for further evaluation. Electronically Signed   By: Iven Finn M.D.   On: 04/25/2021 16:11    Procedures Procedures    Medications Ordered in ED Medications  potassium chloride 10 mEq in 100 mL IVPB (10 mEq Intravenous New Bag/Given 04/26/21 0011)  lactated ringers bolus 500 mL (0 mLs Intravenous Stopped 04/25/21 2004)  iohexol (OMNIPAQUE) 300 MG/ML solution 100 mL (80 mLs Intravenous Contrast Given 04/25/21 2124)    ED Course/ Medical Decision Making/ A&P                           Medical Decision Making Amount and/or Complexity of Data Reviewed Labs: ordered. Radiology: ordered. ECG/medicine tests: ordered.  Risk Prescription drug management. Decision regarding hospitalization.   This patient presents to the ED for concern of sacral wound, this involves an extensive number of treatment options, and is a complaint that carries with it a high risk of complications and morbidity.  The differential diagnosis includes worsening sacral ulcer, cellulitis, abscess   Co morbidities that complicate the patient evaluation  HTN, HLD, OSA, GERD, PE (on Eliquis), and malnutrition.     Additional history obtained:  Additional history obtained from nursing staff at [facility] care center External records from outside source obtained and reviewed including EMR, nursing home paperwork   Lab Tests:  I Ordered, and personally interpreted labs.  The pertinent results include: Hypokalemia   Imaging Studies ordered:  I ordered imaging studies including CT of abdomen and pelvis I independently visualized and interpreted imaging which showed results pending at time of admission I agree with the radiologist interpretation   Cardiac Monitoring:  The patient was maintained on a cardiac monitor.  I personally viewed and interpreted the cardiac  monitored which showed an underlying rhythm of: A-fib   Medicines ordered and prescription drug management:  I ordered medication including IV fluids for hydration, potassium chloride for hypokalemia Reevaluation of the patient after these medicines showed that the patient stayed the same I have reviewed the patients home medicines and have made adjustments as needed   Consultations Obtained:  I requested consultation with the general surgery,  and  discussed lab and imaging findings as well as pertinent plan - they recommend: Admission with plan for debridement tomorrow   Problem List / ED Course:  Patient presenting from skilled nursing facility for concerns of sacral wound.  At baseline, she has advanced dementia.  Patient is unable to provide any history on arrival.  On exam, she is resting in left lateral decubitus position.  She has very limited ability to follow commands.  She is unable to voice intelligible words.  Area of concern was visualized.  At this wound had a wet-to-dry packing in place with a Mepilex cover.  Wound forms a deep pocket.  There is no purulence or surrounding erythema.  Wet-to-dry dressing was replaced.  Patient underwent laboratory work-up which was notable for hypokalemia of 2.5.  Replacement IV potassium was ordered.  I spoke with the staff at her nursing facility who confirms that their primary concern was this worsening sacral wound.  Wound care doctor onsite advised that they bring her to the ED for surgical debridement.  On reassessment, patient resting comfortably.  General surgery was consulted and they advised admission with assessment tomorrow for possible debridement.  Patient was admitted to hospitalist for further management.   Reevaluation:  After the interventions noted above, I reevaluated the patient and found that they have :stayed the same   Social Determinants of Health:  Dementia, requires skilled nursing care   Dispostion:  After  consideration of the diagnostic results and the patients response to treatment, I feel that the patent would benefit from admission.   CRITICAL CARE Performed by: Godfrey Pick   Total critical care time: 32 minutes  Critical care time was exclusive of separately billable procedures and treating other patients.  Critical care was necessary to treat or prevent imminent or life-threatening deterioration.  Critical care was time spent personally by me on the following activities: development of treatment plan with patient and/or surrogate as well as nursing, discussions with consultants, evaluation of patient's response to treatment, examination of patient, obtaining history from patient or surrogate, ordering and performing treatments and interventions, ordering and review of laboratory studies, ordering and review of radiographic studies, pulse oximetry and re-evaluation of patient's condition.         Final Clinical Impression(s) / ED Diagnoses Final diagnoses:  Hypokalemia  Pressure injury of sacral region, unstageable [facility])    Rx / [facility] Orders ED Discharge Orders     None         Godfrey Pick, MD 04/26/21 [phone]

## 2021-04-25 NOTE — Assessment & Plan Note (Signed)
PT OT prior to discharge patient is pending SNF appreciate palliative care consult

## 2021-04-25 NOTE — ED Triage Notes (Signed)
BIB EMS due to chronic decubitus on buttocks that have become worse last several days. From [facility]. 106/64-75-99% RA Pt has Dementia

## 2021-04-25 NOTE — Subjective & Objective (Signed)
Hx od advanced dementia coming from SNF for sacaral wound Been there for a few months worse over past wk Wound care doctor told her to go to ER To have surgical debridement

## 2021-04-25 NOTE — Assessment & Plan Note (Signed)
Chronic continue home medications and monitor for any sign of sundowning

## 2021-04-25 NOTE — H&P (Signed)
[name] [name] SHF:[id number] DOB: [birth year] DOA: 04/25/2021     PCP: Denita Lung, MD      Patient arrived to ER on 04/25/21 at 1415 Referred by Attending Godfrey Pick, MD   Patient coming from:     From facility [facility] health care  Chief Complaint:   Chief Complaint  Patient presents with   Pressure Sores    HPI: [name] [name] is a 88 y.o. female with medical history significant of dementia , massive PE on Eliquis, HTN, pA.fib  Presented with   worsening ulcer Hx od advanced dementia coming from SNF for sacaral wound Been there for a few months worse over past wk Wound care doctor told her to go to ER To have surgical debridement   Bed bound since December 2022   Initial COVID TEST in house  PCR testing  Pending  Lab Results  Component Value Date   [place] 02/21/2021   [place] NEGATIVE 02/12/2021     Regarding pertinent Chronic problems:    Hyperlipidemia -  on statins Zocor Lipid Panel     Component Value Date/Time   CHOL 103 10/12/2020 1542   TRIG 65 10/12/2020 1542   HDL 63 10/12/2020 1542   CHOLHDL 1.6 10/12/2020 1542   CHOLHDL 2.2 09/21/2016 0909   VLDL 16 09/21/2016 0909   LDLCALC 26 10/12/2020 1542   LABVLDL 14 10/12/2020 1542    chronic CHF diastolic/ - last echo 50/277     A. Fib -  - CHA2DS2 vas score 2  current  on anticoagulation with  Eliquis,     Hx of DVT/PE on -  Eliquis, sp Thrombectomy 02/13/2021    Dementia - on  [place]   Chronic anemia - baseline hg Hemoglobin & Hematocrit  Recent Labs    02/19/21 1044 02/22/21 0737 04/25/21 1618  HGB 9.3* 10.6* 12.0  taking the Doxy in the morning and kind of While in ER: Sideoteed to have sacral decube no evidence of sepsi gen Surgery was made aware      CXR - The right heart border is not visualized. This may be due to right middle lobe disease versus patient positioning. Limited evaluation: Bilateral apices not well visualized due to overlying  mandible. Recommend repeat PA and lateral view of the chest for further Evaluation   CTabd/pelvis -  Ordered     Following Medications were ordered in ER: Medications  potassium chloride 10 mEq in 100 mL IVPB (10 mEq Intravenous New Bag/Given 04/25/21 1945)  lactated ringers bolus 500 mL (0 mLs Intravenous Stopped 04/25/21 2004)    _______________________________________________________ ER Provider Called:   gen Surgery  Dr.White They Recommend admit to medicine   Will see in AM      ED Triage Vitals  Enc Vitals Group     BP 04/25/21 1441 (!) 95/50     Pulse Rate 04/25/21 1441 88     Resp 04/25/21 1441 17     Temp 04/25/21 1441 98.4 F (36.9 C)     Temp Source 04/25/21 1441 Oral     SpO2 04/25/21 1441 92 %     Weight --      Height --      Head Circumference --      Peak Flow --      Pain Score 04/25/21 1430 0     Pain Loc --      Pain Edu? --      Excl. in [place]? --  QQPY(19)@     _________________________________________ Significant initial  Findings: Abnormal Labs Reviewed  COMPREHENSIVE METABOLIC PANEL - Abnormal; Notable for the following components:      Result Value   Potassium 2.5 (*)    CO2 33 (*)    Glucose, Bld 101 (*)    Creatinine, Ser <0.30 (*)    Albumin 2.7 (*)    All other components within normal limits  PROTIME-INR - Abnormal; Notable for the following components:   Prothrombin Time 20.0 (*)    INR 1.7 (*)    All other components within normal limits  URINALYSIS, ROUTINE W REFLEX MICROSCOPIC - Abnormal; Notable for the following components:   Color, Urine AMBER (*)    Protein, ur 30 (*)    All other components within normal limits   ECG: Ordered Personally reviewed by me showing: HR : 89 Rhythm: NSR,   no evidence of ischemic changes QTC 445 The recent clinical data is shown below. Vitals:   04/25/21 1521 04/25/21 1734 04/25/21 1900 04/25/21 2000  BP: 102/89 (!) 125/53 110/72 (!) 138/115  Pulse: (!) 114 92 90 64  Resp: (!) 21 20 19 15    Temp: 99.6 F (37.6 C)     TempSrc: Rectal     SpO2: 99% 98% 98% 95%      WBC    Component Value Date/Time   WBC 9.0 04/25/2021 1618   LYMPHSABS 2.0 04/25/2021 1618   LYMPHSABS 1.3 10/12/2020 1542   MONOABS 0.6 04/25/2021 1618   EOSABS 0.1 04/25/2021 1618   EOSABS 0.1 10/12/2020 1542   BASOSABS 0.0 04/25/2021 1618   BASOSABS 0.0 10/12/2020 1542   Lactic Acid, Venous    Component Value Date/Time   LATICACIDVEN 1.5 04/25/2021 1618    UA   no evidence of UTI     Urine analysis:    Component Value Date/Time   COLORURINE AMBER (A) 04/25/2021 1524   APPEARANCEUR CLEAR 04/25/2021 1524   LABSPEC 1.024 04/25/2021 1524   LABSPEC 1.030 09/24/2018 1253   PHURINE 7.0 04/25/2021 1524   GLUCOSEU NEGATIVE 04/25/2021 1524   HGBUR NEGATIVE 04/25/2021 1524   BILIRUBINUR NEGATIVE 04/25/2021 1524   BILIRUBINUR negative 09/24/2018 1253   KETONESUR NEGATIVE 04/25/2021 1524   PROTEINUR 30 (A) 04/25/2021 1524   NITRITE NEGATIVE 04/25/2021 1524   LEUKOCYTESUR NEGATIVE 04/25/2021 1524      _______________________________________________ Hospitalist was called for admission for sacral decube  The following Work up has been ordered so far:  Orders Placed This Encounter  Procedures   Blood Culture (routine x 2)   DG Chest Port 1 View   Comprehensive metabolic panel   CBC WITH DIFFERENTIAL   Protime-INR   Urinalysis, Routine w reflex microscopic   Magnesium   Diet NPO time specified   Cardiac monitoring   Notify provider for difficulties obtaining IV access   Initiate Carrier Fluid Protocol   Document height and weight   Consult to general surgery   Consult to hospitalist   Pulse oximetry, continuous   ED EKG 12-Lead   Insert peripheral IV X 1     OTHER Significant initial  Findings:  labs showing:    Recent Labs  Lab 04/25/21 1618  NA 142  K 2.5*  CO2 33*  GLUCOSE 101*  BUN 11  CREATININE <0.30*  CALCIUM 8.9  MG 1.9    Cr    stable,    Lab Results   Component Value Date   CREATININE <0.30 (L) 04/25/2021   CREATININE 0.54  02/23/2021   CREATININE 0.58 02/22/2021    Recent Labs  Lab 04/25/21 1618  AST 24  ALT 20  ALKPHOS 88  BILITOT 0.6  PROT 7.4  ALBUMIN 2.7*   Lab Results  Component Value Date   CALCIUM 8.9 04/25/2021   PHOS 2.3 (L) 02/23/2021        Plt: Lab Results  Component Value Date   PLT 329 04/25/2021     Recent Labs  Lab 04/25/21 1618  WBC 9.0  NEUTROABS 6.3  HGB 12.0  HCT 37.7  MCV 96.7  PLT 329    HG/HCT  stable,      Component Value Date/Time   HGB 12.0 04/25/2021 1618   HGB 12.8 10/12/2020 1542   HCT 37.7 04/25/2021 1618   HCT 37.4 10/12/2020 1542   MCV 96.7 04/25/2021 1618   MCV 97 10/12/2020 1542      Cardiac Panel (last 3 results) No results for input(s): CKTOTAL, CKMB, TROPONINI, RELINDX in the last 72 hours.  .car BNP (last 3 results) Recent Labs    02/12/21 1556  BNP 366.8*          Cultures:    Component Value Date/Time   SDES LEFT ANTECUBITAL 02/12/2021 1725   SPECREQUEST  02/12/2021 1725    BOTTLES DRAWN AEROBIC AND ANAEROBIC Blood Culture adequate volume   CULT  02/12/2021 1725    NO GROWTH 5 DAYS Performed at [facility], [place] [address], [place], [place] [address]    REPTSTATUS 02/17/2021 FINAL 02/12/2021 1725     Radiological Exams on Admission: DG Chest Port 1 View  Result Date: 04/25/2021 CLINICAL DATA:  Questionable sepsis - evaluate for abnormality EXAM: PORTABLE CHEST 1 VIEW COMPARISON:  Chest x-ray 02/12/2021 FINDINGS: The right heart border is not visualized. Otherwise the heart and mediastinal contours are unchanged. The right heart border aortic calcification. Bilateral apices not well visualized due to overlying mandible. No definite focal consolidation. No pulmonary edema. No pleural effusion. No pneumothorax. No acute osseous abnormality. IMPRESSION: The right heart border is not visualized. This may be due to right middle lobe disease  versus patient positioning. Limited evaluation: Bilateral apices not well visualized due to overlying mandible. Recommend repeat PA and lateral view of the chest for further evaluation. Electronically Signed   By: Iven Finn M.D.   On: 04/25/2021 16:11   _______________________________________________________________________________________________________ Latest  Blood pressure (!) 138/115, pulse 64, temperature 99.6 F (37.6 C), temperature source Rectal, resp. rate 15, SpO2 95 %.   Vitals  labs and radiology finding personally reviewed  Review of Systems:    Pertinent positives include:  worsening ulcer  Constitutional:  No weight loss, night sweats, Fevers, chills, fatigue, weight loss  HEENT:  No headaches, Difficulty swallowing,Tooth/dental problems,Sore throat,  No sneezing, itching, ear ache, nasal congestion, post nasal drip,  Cardio-vascular:  No chest pain, Orthopnea, PND, anasarca, dizziness, palpitations.no Bilateral lower extremity swelling  GI:  No heartburn, indigestion, abdominal pain, nausea, vomiting, diarrhea, change in bowel habits, loss of appetite, melena, blood in stool, hematemesis Resp:  no shortness of breath at rest. No dyspnea on exertion, No excess mucus, no productive cough, No non-productive cough, No coughing up of blood.No change in color of mucus.No wheezing. Skin:  no rash or lesions. No jaundice GU:  no dysuria, change in color of urine, no urgency or frequency. No straining to urinate.  No flank pain.  Musculoskeletal:  No joint pain or no joint swelling. No decreased range of motion. No  back pain.  Psych:  No change in mood or affect. No depression or anxiety. No memory loss.  Neuro: no localizing neurological complaints, no tingling, no weakness, no double vision, no gait abnormality, no slurred speech, no confusion  All systems reviewed and apart from [place] all are  negative _______________________________________________________________________________________________ Past Medical History:   Past Medical History:  Diagnosis Date   Allergy    Arthritis    Asthma    Cataract    Diverticulosis    Dyslipidemia    GERD (gastroesophageal reflux disease)    Hemorrhoids    History of atrial fibrillation    Hypertension    Menopause    Obesity    PVD (peripheral vascular disease) ([place])       Past Surgical History:  Procedure Laterality Date   ABDOMINAL AORTAGRAM N/A 02/09/2012   Procedure: ABDOMINAL Maxcine Ham;  Surgeon: Elam Dutch, MD;  Location: [facility] CATH LAB;  Service: Cardiovascular;  Laterality: N/A;   COLONOSCOPY  02/2004   EYE SURGERY     CATARACT (BILATERAL)   HERNIA REPAIR     abdominal x 2   IR THROMBECT PRIM MECH INIT (INCLU) MOD SED  02/13/2021   IR US GUIDE VASC ACCESS RIGHT  02/13/2021   RADIOLOGY WITH ANESTHESIA N/A 02/12/2021   Procedure: IR WITH ANESTHESIA;  Surgeon: Luanne Bras, MD;  Location: [facility];  Service: Radiology;  Laterality: N/A;   right femoral to anterior tibial bypass  2004   SHOULDER SURGERY     age 83 due to dislocation   TEMPORAL ARTERY BIOPSY / LIGATION      Social History:  Ambulatory   bed bound     reports that she has never smoked. She has never used smokeless tobacco. She reports that she does not drink alcohol and does not use drugs.   Family History:   Family History  Problem Relation Age of Onset   Cancer Sister    Mental retardation Paternal Aunt    Heart disease Son        died age 34yo   Hyperlipidemia Son    Heart attack Son    ______________________________________________________________________________________________ Allergies: No Known Allergies   Prior to Admission medications   Medication Sig Start Date End Date Taking? Authorizing Provider  acetaminophen (TYLENOL) 650 MG CR tablet Take 650-1,300 mg by mouth every 8 (eight) hours as needed (arthritis pain).     [provider]  apixaban (ELIQUIS) 5 MG TABS tablet Take 1 tablet (5 mg total) by mouth 2 (two) times daily. 02/22/21   Regalado, Belkys A, MD  docusate sodium (COLACE) 100 MG capsule Take 1 capsule (100 mg total) by mouth 2 (two) times daily as needed for mild constipation. 02/22/21   Regalado, Belkys A, MD  feeding supplement (ENSURE ENLIVE / ENSURE PLUS) LIQD Take 237 mLs by mouth 3 (three) times daily between meals. 02/22/21   Regalado, Jerald Kief A, MD  ferrous sulfate 325 (65 FE) MG tablet Take 1 tablet (325 mg total) by mouth daily with breakfast. 02/23/21   Regalado, Belkys A, MD  magnesium oxide (MAG-OX) 400 (240 Mg) MG tablet Take 1 tablet (400 mg total) by mouth 2 (two) times daily. 02/22/21   Regalado, Belkys A, MD  memantine (NAMENDA XR) 14 MG CP24 24 hr capsule Take 1 capsule (14 mg total) by mouth daily. 07/13/20   Ward Givens, NP  Multiple Vitamin (MULITIVITAMIN WITH MINERALS) TABS Take 1 tablet by mouth daily.    [provider]  pantoprazole (  PROTONIX) 40 MG tablet Take 1 tablet (40 mg total) by mouth daily. 02/23/21   Regalado, Jerald Kief A, MD  phosphorus (K PHOS NEUTRAL) 155-852-130 MG tablet Take 2 tablets (500 mg total) by mouth 2 (two) times daily. 02/22/21   Regalado, Belkys A, MD  Pyridoxine HCl (VITAMIN B-6 PO) Take 1 tablet by mouth daily.    [provider]  simvastatin (ZOCOR) 40 MG tablet Take 1 tablet (40 mg total) by mouth at bedtime. 10/12/20   Denita Lung, MD  triamcinolone cream (KENALOG) 0.5 % APPLY TOPICALLY 3 TIMES  DAILY Patient taking differently: Apply 1 application topically 3 (three) times daily as needed (rash). APPLY TOPICALLY 3 TIMES  DAILY 06/10/19   Denita Lung, MD    ___________________________________________________________________________________________________ Physical Exam: Vitals with BMI 04/25/2021 04/25/2021 04/25/2021  Height - - -  Weight - - -  BMI - - -  Systolic 202 542 706  Diastolic 237 72 53  Pulse 64  90 92     1. General:  in No  Acute distress    Chronically ill  cachectic  -appearing 2. Psychological: Alert and  Oriented to self 3. Head/ENT:   Dry Mucous Membranes                          Head Non traumatic, neck supple                           Poor Dentition 4. SKIN:  decreased Skin turgor,  Skin clean Dry     5. Heart: Regular rate and rhythm no  Murmur, no Rub or gallop 6. Lungs:    no wheezes or crackles   7. Abdomen: Soft,  non-tender, Non distended   bowel sounds present 8. Lower extremities: no clubbing, cyanosis, no  edema 9. Neurologically Grossly intact, moving upper ext equally not cooperative with lower ext exam   10. MSK: Normal range of motion    Chart has been reviewed  ______________________________________________________________________________________________  Assessment/Plan  88 y.o. female with medical history significant of dementia , massive PE on Eliquis, HTN, pA.fib Admitted for worsening decubitus ulcer  Present on Admission:  Decubitus ulcer  Hyperlipidemia  Essential hypertension, benign  Alzheimer's disease ([place])  Pulmonary embolism ([place])  Protein-calorie malnutrition, severe  Hypokalemia  Functional quadriplegia ([place])     Decubitus ulcer Suspect underlying osteomyelitis obtain CT to further evaluate her deep is ulceration care.  At this point no evidence of sepsis we will hold off on antibiotics. Pending results may need ID consult. Appreciate general surgery consult patient is fragile day of recent large  PE requiring thrombectomy and on chronic anticoagulation If general surgery feels that patient would benefit from debridement would need to switch to heparin.  Discussed with family who would like to have further discussion with general surgery prior to proceeding as there is concerns of patient able to tolerate it given her dementia and fragility  Hyperlipidemia Chronic restart Zocor when able to tolerate  Essential  hypertension, benign Chronic stable not on blood pressure meds  Alzheimer's disease ([place]) Chronic continue home medications and monitor for any sign of sundowning  Pulmonary embolism ([place]) For now continue Eliquis if general surgery feels the patient would be a candidate for debridement with need to transition on heparin  Protein-calorie malnutrition, severe Check check prealbumin order nutritional consult  Hypokalemia - will replace and repeat in AM,  check magnesium level  and replace as needed   Functional quadriplegia ([place]) PT OT prior to discharge patient is pending SNF appreciate palliative care consult  Paroxysmal atrial fibrillation ([place]) Rate controlled for now continue Eliquis    Other plan as per orders.  DVT prophylaxis: eliquis    Code Status:    Code Status: Prior FULL CODE  as per  family but would like to have further discussion in the need for changing patient to DNR/DNI I had personally discussed CODE STATUS with family    Family Communication:   Family not at  Bedside  plan of care was discussed on the phone with  Daughter,    Disposition Plan:                             Back to current facility when stable                            Following barriers for discharge:                            Electrolytes corrected                                                         Pain controlled with PO medications                                                       Will need to be able to tolerate PO                                                      Will need consultants to evaluate patient prior to discharge                       Would benefit from PT/OT eval prior to DC  Ordered                   Swallow eval - SLP ordered                                    Transition of care consulted                   Nutrition    consulted                  Wound care  consulted                   Palliative care    consulted                                         Consults called: general surgery  is aware  Admission status:  ED Disposition     ED Disposition  Admit   Condition  --   [place]: [place] [100102]  Level of Care: Telemetry [5]  Admit to tele based on following criteria: Other see comments  Comments: hypokalemia  May admit patient to [facility] or [facility] if equivalent level of care is available:: No  Covid Evaluation: Asymptomatic Screening Protocol (No Symptoms)  Diagnosis: Decubitus ulcer [296503]  Admitting Physician: Toy Baker [[id number]]  Attending Physician: Toy Baker [[id number]]  Estimated length of stay: past midnight tomorrow  Certification:: I certify this patient will need inpatient services for at least 2 midnights           inpatient     I Expect 2 midnight stay secondary to severity of patient's current illness need for inpatient interventions justified by the following:  hemodynamic instability despite optimal treatment     Severe lab/radiological/exam abnormalities including:    Sacral decube and extensive comorbidities including:  CHF    dementia    Chronic anticoagulation  That are currently affecting medical management.   I expect  patient to be hospitalized for 2 midnights requiring inpatient medical care.  Patient is at high risk for adverse outcome (such as loss of life or disability) if not treated.  Indication for inpatient stay as follows:  Severe change from baseline regarding mental status   Need for operative/procedural  intervention      Level of care    tele  For 12H     Lab Results  Component Value Date   [place] 02/21/2021     Precautions: admitted as  asymptomatic screening protocol     [name] [name] 04/25/2021, 9:56 PM    [facility]     after 2 AM please page floor coverage PA If 7AM-7PM, please contact the day team taking care of the patient using [email or web address]   Patient was  evaluated in the context of the global COVID-19 pandemic, which necessitated consideration that the patient might be at risk for infection with the SARS-CoV-2 virus that causes COVID-19. Institutional protocols and algorithms that pertain to the evaluation of patients at risk for COVID-19 are in a state of rapid change based on information released by regulatory bodies including the CDC and federal and state organizations. These policies and algorithms were followed during the patient's care.

## 2021-04-26 ENCOUNTER — Other Ambulatory Visit: Payer: Self-pay

## 2021-04-26 LAB — CBC WITH DIFFERENTIAL/PLATELET
Abs Immature Granulocytes: 0.04 10*3/uL (ref 0.00–0.07)
Basophils Absolute: 0 10*3/uL (ref 0.0–0.1)
Basophils Relative: 0 %
Eosinophils Absolute: 0.1 10*3/uL (ref 0.0–0.5)
Eosinophils Relative: 1 %
HCT: 34.5 % — ABNORMAL LOW (ref 36.0–46.0)
Hemoglobin: 11 g/dL — ABNORMAL LOW (ref 12.0–15.0)
Immature Granulocytes: 0 %
Lymphocytes Relative: 16 %
Lymphs Abs: 1.6 10*3/uL (ref 0.7–4.0)
MCH: 31 pg (ref 26.0–34.0)
MCHC: 31.9 g/dL (ref 30.0–36.0)
MCV: 97.2 fL (ref 80.0–100.0)
Monocytes Absolute: 0.5 10*3/uL (ref 0.1–1.0)
Monocytes Relative: 5 %
Neutro Abs: 7.9 10*3/uL — ABNORMAL HIGH (ref 1.7–7.7)
Neutrophils Relative %: 78 %
Platelets: 307 10*3/uL (ref 150–400)
RBC: 3.55 MIL/uL — ABNORMAL LOW (ref 3.87–5.11)
RDW: 13.5 % (ref 11.5–15.5)
WBC: 10 10*3/uL (ref 4.0–10.5)
nRBC: 0 % (ref 0.0–0.2)

## 2021-04-26 LAB — CK: Total CK: 112 U/L (ref 38–234)

## 2021-04-26 LAB — BASIC METABOLIC PANEL
Anion gap: 7 (ref 5–15)
BUN: 9 mg/dL (ref 8–23)
CO2: 29 mmol/L (ref 22–32)
Calcium: 8.5 mg/dL — ABNORMAL LOW (ref 8.9–10.3)
Chloride: 101 mmol/L (ref 98–111)
Creatinine, Ser: 0.36 mg/dL — ABNORMAL LOW (ref 0.44–1.00)
Glucose, Bld: 82 mg/dL (ref 70–99)
Potassium: 3.8 mmol/L (ref 3.5–5.1)
Sodium: 137 mmol/L (ref 135–145)

## 2021-04-26 LAB — COMPREHENSIVE METABOLIC PANEL
ALT: 17 U/L (ref 0–44)
AST: 20 U/L (ref 15–41)
Albumin: 2.4 g/dL — ABNORMAL LOW (ref 3.5–5.0)
Alkaline Phosphatase: 84 U/L (ref 38–126)
Anion gap: 9 (ref 5–15)
BUN: 8 mg/dL (ref 8–23)
CO2: 28 mmol/L (ref 22–32)
Calcium: 8.7 mg/dL — ABNORMAL LOW (ref 8.9–10.3)
Chloride: 101 mmol/L (ref 98–111)
Creatinine, Ser: 0.49 mg/dL (ref 0.44–1.00)
GFR, Estimated: >60 mL/min (ref >60)
Glucose, Bld: 82 mg/dL (ref 70–99)
Potassium: 2.9 mmol/L — ABNORMAL LOW (ref 3.5–5.1)
Sodium: 138 mmol/L (ref 135–145)
Total Bilirubin: 1 mg/dL (ref 0.3–1.2)
Total Protein: 6.8 g/dL (ref 6.5–8.1)

## 2021-04-26 LAB — PHOSPHORUS
Phosphorus: 2.9 mg/dL (ref 2.5–4.6)
Phosphorus: 2.5 mg/dL (ref 2.5–4.6)

## 2021-04-26 LAB — HEPATIC FUNCTION PANEL
ALT: 17 U/L (ref 0–44)
AST: 38 U/L (ref 15–41)
Albumin: 2.4 g/dL — ABNORMAL LOW (ref 3.5–5.0)
Alkaline Phosphatase: 84 U/L (ref 38–126)
Bilirubin, Direct: 0.5 mg/dL — ABNORMAL HIGH (ref 0.0–0.2)
Indirect Bilirubin: 1 mg/dL — ABNORMAL HIGH (ref 0.3–0.9)
Total Bilirubin: 1.5 mg/dL — ABNORMAL HIGH (ref 0.3–1.2)
Total Protein: 6.3 g/dL — ABNORMAL LOW (ref 6.5–8.1)

## 2021-04-26 LAB — TSH: TSH: 2.344 u[IU]/mL (ref 0.350–4.500)

## 2021-04-26 LAB — PREALBUMIN: Prealbumin: 6.3 mg/dL — ABNORMAL LOW (ref 18–38)

## 2021-04-26 LAB — MAGNESIUM: Magnesium: 1.7 mg/dL (ref 1.7–2.4)

## 2021-04-26 MED ORDER — MAGNESIUM SULFATE 2 GM/50ML IV SOLN
2.0000 g | Freq: Once | INTRAVENOUS | Status: AC
Start: 2021-04-26 — End: 2021-04-27
  Administered 2021-04-26: 2 g via INTRAVENOUS
  Filled 2021-04-26: qty 50

## 2021-04-26 MED ORDER — POLYETHYLENE GLYCOL 3350 17 G PO PACK
17.0000 g | PACK | ORAL | Status: AC
  Administered 2021-04-26 (×2): 17 g via ORAL
  Filled 2021-04-26 (×2): qty 1

## 2021-04-26 MED ORDER — SODIUM CHLORIDE 0.9 % IV SOLN
75.0000 mL/h | INTRAVENOUS | Status: AC
  Administered 2021-04-26: 75 mL/h via INTRAVENOUS

## 2021-04-26 MED ORDER — POTASSIUM CHLORIDE 10 MEQ/100ML IV SOLN
10.0000 meq | INTRAVENOUS | Status: AC
  Administered 2021-04-26 (×3): 10 meq via INTRAVENOUS
  Filled 2021-04-26 (×2): qty 100

## 2021-04-26 MED ORDER — APIXABAN 5 MG PO TABS: 5.0000 mg | ORAL_TABLET | Freq: Two times a day (BID) | ORAL | Status: DC

## 2021-04-26 MED ORDER — ACETAMINOPHEN 650 MG RE SUPP: 650.0000 mg | Freq: Four times a day (QID) | RECTAL | Status: DC | PRN

## 2021-04-26 MED ORDER — ENSURE ENLIVE PO LIQD
237.0000 mL | Freq: Three times a day (TID) | ORAL | Status: DC
  Administered 2021-04-26 – 2021-04-28 (×5): 237 mL via ORAL
  Filled 2021-04-26 (×3): qty 237

## 2021-04-26 MED ORDER — POTASSIUM CHLORIDE 10 MEQ/100ML IV SOLN
10.0000 meq | Freq: Once | INTRAVENOUS | Status: AC
  Administered 2021-04-26: 10 meq via INTRAVENOUS
  Filled 2021-04-26: qty 100

## 2021-04-26 MED ORDER — APIXABAN 5 MG PO TABS
5.0000 mg | ORAL_TABLET | Freq: Two times a day (BID) | ORAL | Status: DC
Start: 2021-04-26 — End: 2021-04-29
  Administered 2021-04-26 – 2021-04-28 (×4): 5 mg via ORAL
  Filled 2021-04-26 (×4): qty 1

## 2021-04-26 MED ORDER — PANTOPRAZOLE SODIUM 40 MG PO TBEC
40.0000 mg | DELAYED_RELEASE_TABLET | Freq: Every day | ORAL | Status: DC
  Filled 2021-04-26: qty 1

## 2021-04-26 MED ORDER — FERROUS SULFATE 325 (65 FE) MG PO TABS
325.0000 mg | ORAL_TABLET | Freq: Every day | ORAL | Status: DC
  Administered 2021-04-27 – 2021-04-28 (×2): 325 mg via ORAL
  Filled 2021-04-26 (×2): qty 1

## 2021-04-26 MED ORDER — ACETAMINOPHEN 325 MG PO TABS
650.0000 mg | ORAL_TABLET | Freq: Four times a day (QID) | ORAL | Status: DC | PRN
  Administered 2021-04-27 (×2): 650 mg via ORAL
  Filled 2021-04-26 (×2): qty 2

## 2021-04-26 MED ORDER — SIMVASTATIN 40 MG PO TABS
40.0000 mg | ORAL_TABLET | Freq: Every day | ORAL | Status: DC
  Administered 2021-04-26 – 2021-04-27 (×2): 40 mg via ORAL
  Filled 2021-04-26 (×2): qty 1

## 2021-04-26 MED ORDER — HYDROCODONE-ACETAMINOPHEN 5-325 MG PO TABS
1.0000 | ORAL_TABLET | ORAL | Status: DC | PRN
  Administered 2021-04-27 (×2): 2 via ORAL
  Filled 2021-04-26 (×2): qty 2

## 2021-04-26 MED ORDER — MEMANTINE HCL ER 14 MG PO CP24
14.0000 mg | ORAL_CAPSULE | Freq: Every day | ORAL | Status: DC
  Administered 2021-04-27 – 2021-04-28 (×2): 14 mg via ORAL
  Filled 2021-04-26 (×3): qty 1

## 2021-04-26 MED ORDER — MIRTAZAPINE 15 MG PO TABS
7.5000 mg | ORAL_TABLET | Freq: Every day | ORAL | Status: DC
  Administered 2021-04-26 – 2021-04-27 (×2): 7.5 mg via ORAL
  Filled 2021-04-26 (×2): qty 1

## 2021-04-26 MED ORDER — DAKINS (1/4 STRENGTH) 0.125 % EX SOLN
Freq: Two times a day (BID) | CUTANEOUS | Status: DC
Start: 2021-04-26 — End: 2021-04-29
  Administered 2021-04-26: 1 via TOPICAL
  Filled 2021-04-26 (×2): qty 473

## 2021-04-26 NOTE — Evaluation (Signed)
Clinical/Bedside Swallow Evaluation Patient Details  Name: NEVEEN DAPONTE MRN: [id number] Date of Birth: 06-22-1931  Today's Date: 04/26/2021 Time: SLP Start Time (ACUTE ONLY): 1659 SLP Stop Time (ACUTE ONLY): 1709 SLP Time Calculation (min) (ACUTE ONLY): 10 min  Past Medical History:  Past Medical History:  Diagnosis Date   Allergy    Arthritis    Asthma    Cataract    Diverticulosis    Dyslipidemia    GERD (gastroesophageal reflux disease)    Hemorrhoids    History of atrial fibrillation    Hypertension    Menopause    Obesity    PVD (peripheral vascular disease) ([place])    Past Surgical History:  Past Surgical History:  Procedure Laterality Date   ABDOMINAL AORTAGRAM N/A 02/09/2012   Procedure: ABDOMINAL Maxcine Ham;  Surgeon: Elam Dutch, MD;  Location: [facility] CATH LAB;  Service: Cardiovascular;  Laterality: N/A;   COLONOSCOPY  02/2004   EYE SURGERY     CATARACT (BILATERAL)   HERNIA REPAIR     abdominal x 2   IR THROMBECT PRIM MECH INIT (INCLU) MOD SED  02/13/2021   IR US GUIDE VASC ACCESS RIGHT  02/13/2021   RADIOLOGY WITH ANESTHESIA N/A 02/12/2021   Procedure: IR WITH ANESTHESIA;  Surgeon: Luanne Bras, MD;  Location: [facility];  Service: Radiology;  Laterality: N/A;   right femoral to anterior tibial bypass  2004   SHOULDER SURGERY     age 86 due to dislocation   TEMPORAL ARTERY BIOPSY / LIGATION     HPI:  88 yo female adm to [facility] with worsening decubitus ulcer.  Pt has h/o dementia, HTN, GERD, RA, Afib, gait abnormality, hospital admit for AMS, ? UTI and found to have PE 02/2021 s/p IR intervention and currently on Eliquis.  After December admit, pt dc'd to SNF when she had previously been at home.  CT abdomen 04/25/2021 bronchial thickening in the lower lobes consistent with bronchitis and/or small airways disease.  Last brain imaging was negative for acute change and showed Chronic atrophic and ischemic changes without acute abnormality when scanned for headaches in  2019.  MRI 2020 showed moderate atrophy.    Assessment / Plan / Recommendation  Clinical Impression  Patient presents with functional oropharyngeal swallow ability based on clinical swallow evaluation.  Clinical evaluation was limited as pt was needing to be transferred to 6th floor.  No focal CN deficits present and swallow clinically judged to be timely.  She does not have lower dentures but was able to masticate graham cracker adequately without retention. She was able to hold the graham cracker in her right hand despite her extensive arthritis.   Finger foods may be easier for pt to feed herself.  Delayed productive cough x2 - once to large bolus of viscous yellow secretions (approx 1/2 Tbsp amount) and 2nd time to white frothy bolus.  No cough observed at baseline - thus SLP will follow up x1 to assure po tolerance and for family education.  SLP advised RN to concern for potential oral candidiasis - pt was being transferred to 6th floor, thus will follow up next date. SLP Visit Diagnosis: Dysphagia, unspecified (R13.10)    Aspiration Risk  Mild aspiration risk    Diet Recommendation Regular;Thin liquid (prefer finger foods to allow her to self feed)   Liquid Administration via: Cup;Straw Medication Administration: Whole meds with liquid Supervision: Staff to assist with self feeding Compensations: Slow rate;Small sips/bites Postural Changes: Seated upright at 90 degrees;Remain upright  for at least 30 minutes after po intake    Other  Recommendations Oral Care Recommendations: Oral care BID    Recommendations for follow up therapy are one component of a multi-disciplinary discharge planning process, led by the attending physician.  Recommendations may be updated based on patient status, additional functional criteria and insurance authorization.  Follow up Recommendations Skilled nursing-short term rehab (<3 hours/day)      Assistance Recommended at Discharge Frequent or constant  Supervision/Assistance  Functional Status Assessment Patient has had a recent decline in their functional status and demonstrates the ability to make significant improvements in function in a reasonable and predictable amount of time.  Frequency and Duration min 1 x/week  1 week       Prognosis Prognosis for Safe Diet Advancement: Fair Barriers to Reach Goals: Cognitive deficits      Swallow Study   General Date of Onset: 04/26/21 HPI: 88 yo female adm to [facility] with worsening decubitus ulcer.  Pt has h/o dementia, HTN, GERD, RA, Afib, gait abnormality, hospital admit for AMS, ? UTI and found to have PE 02/2021 s/p IR intervention and currently on Eliquis.  After December admit, pt dc'd to SNF when she had previously been at home.  CT abdomen 04/25/2021 bronchial thickening in the lower lobes consistent with bronchitis and/or small airways disease.  Last brain imaging was negative for acute change and showed Chronic atrophic and ischemic changes without acute abnormality when scanned for headaches in 2019.  MRI 2020 showed moderate atrophy. Type of Study: Bedside Swallow Evaluation Diet Prior to this Study: Regular;Thin liquids Temperature Spikes Noted: No Respiratory Status: Room air History of Recent Intubation: No Behavior/Cognition: Alert;Cooperative;Other (Comment) (inconsistently follows directions) Oral Cavity Assessment: Other (comment) (Slight white coating on tongue, concerning for potential oral candidiasis- pt denies lingual pain) Oral Care Completed by SLP: No Oral Cavity - Dentition: Dentures, top;Edentulous Vision: Impaired for self-feeding Self-Feeding Abilities: Needs assist Patient Positioning: Upright in bed Baseline Vocal Quality: Normal Volitional Cough: Cognitively unable to elicit Volitional Swallow: Unable to elicit    Oral/Motor/Sensory Function Overall Oral Motor/Sensory Function: Within functional limits (from tasks that pt completed)   Ice Chips Ice chips: Not  tested   Thin Liquid Thin Liquid: Impaired Presentation: Straw Pharyngeal  Phase Impairments: Cough - Delayed Other Comments: delayed productive cough concerning for potential delayed aspiration or laryngeal penetration - ? differential from reflux?    Nectar Thick Nectar Thick Liquid: Not tested   Honey Thick Honey Thick Liquid: Not tested   Puree Puree: Within functional limits Presentation: Spoon   Solid     Solid: Within functional limits Presentation: Self Fredirick Lathe 04/26/2021,5:35 PM  Kathleen Lime, MS [facility] SLP [place] Office [phone] Pager [phone]

## 2021-04-26 NOTE — ED Notes (Signed)
Discussed with Hospitalist pt's PO medications have been held until [place] is complete.No further orders at this time. Will continue to monitor.

## 2021-04-26 NOTE — Consult Note (Signed)
Consultation Note Date: 04/27/2021   Patient Name: [name] [name]  DOB: 02/07/1932  MRN: [id number]  Age / Sex: 88 y.o., female  PCP: Denita Lung, MD Referring Physician: Oswald Hillock, MD  Reason for Consultation: Establishing goals of care  HPI/Patient Profile: 88 y.o. female  with past medical history of advanced dementia, massive PE on Eliquis, HTN, atrial fibrillation, PVD, diverticulosis admitted on 04/25/2021 from [facility] with worsening sacral ulcer for consideration of surgical debridement.   Clinical Assessment and Goals of Care: I met today with Ms. Enwright. She has recently had pain medication so she is sleepy. She is pleasantly confused. She passed swallow evaluation yesterday and placed on regular diet. Discussed with RN who reports that she has very poor appetite and only taking a few bites and sips occasionally before saying she is no longer hungry. She has also been complaining of pain all over but recently had tramadol which has provided her with good relief.   I called daughter, [name], who is recovering from stroke herself so she is unable to be present with her mother as she would like too. Given her own health challenges she does not have a clear picture of how well or how poorly her mother has been doing. She has not really had anyone to explain her mother's condition, dementia, expectations or barriers. Although she is hopeful that her mother can have some improvement she understands the challenges. She is hopeful for return to SNF for wound care. She wishes she could have her return home with her but she cannot physically care for her mother at this time.   We did discuss code status and [name] was clear that she does not feel her mother would want resuscitation efforts. I explained that this is what we call a DNR - do not resuscitate and she agrees she wishes a DNR for her  mother. She states that her mother has already been through so much and she does not feel her mother would want resuscitation even though they have never directly discussed. She does wish for continued care and interventions to prevent her mother from declining any further than she already has if at all possible.   All questions/concerns addressed. Emotional support provided.   Primary Decision Maker NEXT OF KIN daughter    SUMMARY OF RECOMMENDATIONS   - DNR decided - Hopeful to return to SNF and continue wound care  Code Status/Advance Care Planning: DNR   Symptom Management:  Per attending.   Palliative Prophylaxis:  Bowel Regimen, Delirium Protocol, Frequent Pain Assessment, Oral Care, Palliative Wound Care, and Turn Reposition  Prognosis:  Overall prognosis poor with dementia, poor functional status, poor nutritional status, worsening sacral wound.   Discharge Planning: SNF and will need ongoing conversation with outpatient palliative. Daughter, [name], is unable to see her mother in person and will need to be updated and educated to progress and expectations via telephone.       Primary Diagnoses: Present on Admission:  Decubitus ulcer  Hyperlipidemia  Essential hypertension, benign  Alzheimer's disease ([place])  Pulmonary embolism (HCC)  Protein-calorie malnutrition, severe  Hypokalemia  Functional quadriplegia (HCC)  Paroxysmal atrial fibrillation ([place])   I have reviewed the medical record, interviewed the patient and family, and examined the patient. The following aspects are pertinent.  Past Medical History:  Diagnosis Date   Allergy    Arthritis    Asthma    Cataract    Diverticulosis    Dyslipidemia    GERD (gastroesophageal reflux disease)    Hemorrhoids    History of atrial fibrillation    Hypertension    Menopause    Obesity    PVD (peripheral vascular disease) ([place])    Social History   Socioeconomic History   Marital status: Widowed     Spouse name: Not on file   Number of children: Not on file   Years of education: Not on file   Highest education level: Not on file  Occupational History   Not on file  Tobacco Use   Smoking status: Never   Smokeless tobacco: Never  Vaping Use   Vaping Use: Never used  Substance and Sexual Activity   Alcohol use: No   Drug use: No   Sexual activity: Not Currently  Other Topics Concern   Not on file  Social History Narrative   Lives at home with husband, exercise some with water aerobics 2 days per week, has 3 children, 6 grandchildren, 2 great grandchildren   Social Determinants of Radio broadcast assistant Strain: Not on file  Food Insecurity: Not on file  Transportation Needs: Not on file  Physical Activity: Not on file  Stress: Not on file  Social Connections: Not on file   Family History  Problem Relation Age of Onset   Cancer Sister    Mental retardation Paternal Aunt    Heart disease Son        died age 87yo   Hyperlipidemia Son    Heart attack Son    Scheduled Meds:  apixaban  5 mg Oral BID   feeding supplement  237 mL Oral TID BM   [START ON 04/27/2021] ferrous sulfate  325 mg Oral Q breakfast   memantine  14 mg Oral Daily   mirtazapine  7.5 mg Oral QHS   pantoprazole  40 mg Oral Daily   simvastatin  40 mg Oral QHS   sodium hypochlorite   Topical BID   Continuous Infusions:  sodium chloride 75 mL/hr (04/26/21 1321)   PRN Meds:.acetaminophen **OR** acetaminophen, HYDROcodone-acetaminophen No Known Allergies Review of Systems  Unable to perform ROS: Dementia  Constitutional:  Positive for activity change, appetite change and fatigue.  Neurological:  Positive for weakness.   Physical Exam Vitals and nursing note reviewed.  Constitutional:      General: She is sleeping. She is not in acute distress.    Appearance: She is ill-appearing.  Cardiovascular:     Rate and Rhythm: Normal rate.  Pulmonary:     Effort: No tachypnea, accessory muscle usage  or respiratory distress.  Abdominal:     Palpations: Abdomen is soft.  Neurological:     Comments: Pleasantly confused    Vital Signs: BP 116/60    Pulse 73    Temp 99.6 F (37.6 C) (Rectal)    Resp 17    SpO2 100%  Pain Scale: 0-10   Pain Score: 0-No pain   SpO2: SpO2: 100 % O2 Device:SpO2: 100 % O2 Flow Rate: .   IO:  Intake/output summary:  Intake/Output Summary (Last 24 hours) at 04/26/2021 1432 Last data filed at 04/25/2021 2004 Gross per 24 hour  Intake 500 ml  Output 700 ml  Net -200 ml    LBM:   Baseline Weight:   Most recent weight:       Palliative Assessment/Data:    Time Total: 55 min  Greater than 50%  of this time was spent counseling and coordinating care related to the above assessment and plan.  Signed by: Vinie Sill, NP Palliative Medicine Team Pager # [phone] (M-F 8a-5p) Team Phone # [phone] (Nights/Weekends)

## 2021-04-26 NOTE — Progress Notes (Signed)
[facility]  PROGRESS NOTE  [name] [name] XLK:[id number] DOB: 1932/01/07 DOA: 04/25/2021 PCP: Denita Lung, MD   Brief HPI:   88 year old female with medical history of dementia, massive PE on Eliquis, hypertension, paroxysmal atrial fibrillation presented with worsening decubitus ulcer.  Patient has history of advanced dementia, came from skilled nursing facility for sacral wound.  She has been there for past few months.  Wound care physician told her to go to the ER for surgical debridement.  Patient has been bedbound since December 2022. She has a history of chronic diastolic CHF, atrial fibrillation on Eliquis, history of DVT/PE on Eliquis.  History dementia  General surgery was consulted   Subjective   Patient seen and examined, confused.   Assessment/Plan:   Decubitus ulcer -CT abdomen/pelvis was obtained, which was reviewed by general surgery and they felt this is likely stage IV with eschar overlying the bone; recommended local wound care.  No surgical debridement recommended -Wound care consulted  Hypokalemia -Potassium was 2.9 -We will replace potassium and follow BMP in am  Paroxysmal atrial fibrillation -Heart rate is controlled -Eliquis restarted since patient does not want to go for surgery  Pulmonary embolism -Continue Eliquis  Constipation -CT scan shows constipation without evidence of SBO, increased rectal fecal impaction with scattered rectal wall pneumatosis which could be due to to wall ischemia or stercoral proctitis. -Called and discussed with Dr. Michail Sermon, he recommends giving 2 doses of MiraLAX tonight along with tapwater enema; if no improvement then we will get formal GI consult in a.m.  Alzheimer's disease -Continue [place]  Goals of care discussion -Palliative care has been consulted for goals of care discussion   Medications     apixaban  5 mg Oral BID   feeding supplement  237 mL Oral TID BM   [START ON 04/27/2021] ferrous  sulfate  325 mg Oral Q breakfast   memantine  14 mg Oral Daily   mirtazapine  7.5 mg Oral QHS   pantoprazole  40 mg Oral Daily   simvastatin  40 mg Oral QHS   sodium hypochlorite   Topical BID     Data Reviewed:   CBG:  No results for input(s): GLUCAP in the last 168 hours.  SpO2: 100 %    Vitals:   04/26/21 1400 04/26/21 1415 04/26/21 1430 04/26/21 1648  BP: (!) 111/47 (!) 94/46 (!) 107/48   Pulse:      Resp: 16 17 17 18   Temp:      TempSrc:      SpO2:          Data Reviewed:  Basic Metabolic Panel: Recent Labs  Lab 04/25/21 1618 04/26/21 0145 04/26/21 1316  NA 142 137 138  K 2.5* 3.8 2.9*  CL 100 101 101  CO2 33* 29 28  GLUCOSE 101* 82 82  BUN 11 9 8   CREATININE <0.30* 0.36* 0.49  CALCIUM 8.9 8.5* 8.7*  MG 1.9  --  1.7  PHOS  --  2.9 2.5    CBC: Recent Labs  Lab 04/25/21 1618 04/26/21 1316  WBC 9.0 10.0  NEUTROABS 6.3 7.9*  HGB 12.0 11.0*  HCT 37.7 34.5*  MCV 96.7 97.2  PLT 329 307    LFT Recent Labs  Lab 04/25/21 1618 04/26/21 0145 04/26/21 1316  AST 24 38 20  ALT 20 17 17   ALKPHOS 88 84 84  BILITOT 0.6 1.5* 1.0  PROT 7.4 6.3* 6.8  ALBUMIN 2.7* 2.4* 2.4*  Antibiotics: Anti-infectives (From admission, onward)    None        DVT prophylaxis: Apixaban  Code Status: Full code  Family Communication: No family at bedside   CONSULTS    Objective    Physical Examination:  General-appears in no acute distress Heart-S1-S2, regular, no murmur auscultated Lungs-clear to auscultation bilaterally, no wheezing or crackles auscultated Abdomen-soft, nontender, no organomegaly Extremities-no edema in the lower extremities Neuro-alert, pleasantly confused  Status is: Inpatient: Decubitus ulcer    Pressure Injury 02/13/21 Sacrum Mid Stage 2 -  Partial thickness loss of dermis presenting as a shallow open injury with a red, pink wound bed without slough. pink (Active)  02/13/21 0200  Location: Sacrum  Location  Orientation: Mid  Staging: Stage 2 -  Partial thickness loss of dermis presenting as a shallow open injury with a red, pink wound bed without slough.  Wound Description (Comments): pink  Present on Admission: Yes     Pressure Injury 02/13/21 Buttocks Right Stage 2 -  Partial thickness loss of dermis presenting as a shallow open injury with a red, pink wound bed without slough. pink (Active)  02/13/21 0200  Location: Buttocks  Location Orientation: Right  Staging: Stage 2 -  Partial thickness loss of dermis presenting as a shallow open injury with a red, pink wound bed without slough.  Wound Description (Comments): pink  Present on Admission: Yes          [place]   [facility] If 7PM-7AM, please contact night-coverage at [email or web address], Office  [phone]   04/26/2021, 6:05 PM  LOS: 1 day

## 2021-04-26 NOTE — Progress Notes (Signed)
Palliative:  I met today at Ms. Millon's bedside but no family or visitors present. She is pleasant but confused. Responses mostly appropriate to pointed questions to express her needs. Unaware of her wound and medical situation. I will try tomorrow to reach her daughter for further conversation regarding goals of care.   No charge  Vinie Sill, NP Palliative Medicine Team Pager [phone] (Please see [email or web address] for schedule) Team Phone [phone]

## 2021-04-26 NOTE — ED Notes (Signed)
Hospitalist at the bedside 

## 2021-04-26 NOTE — ED Notes (Signed)
Patient cleaned of incontinence.  Wound cleaned and repacked and mepilex reapplied.

## 2021-04-26 NOTE — Consult Note (Signed)
WOC Nurse Consult Note: Reason for Consult: Unstageable pressure injury to sacrum. Also Stage 3 PI to right medial knee. Seen by Surgery (Drs. White and [employer]). They have signed off in favor of topical wound care. Wound type: Pressure, moisture Pressure Injury POA: Yes Measurement: 7cm x 4cm x 4cm with 3cm undermining at 12 o'clock Right medial knee:  4cm x 3cm x 0.2cm Stage 3 Wound bed: black, soft, malodorous Drainage (amount, consistency, odor) small amount dark drainage Periwound: intact, macerated from fecal incontinence Dressing procedure/placement/frequency: Mattress replacement has been ordered, also Prevalon boots.  Topical care will be with Dakin's solution twice daily dressings x 3 days, then NS dressings twice daily.  Turning and repositioning will be the cornerstone of the POC. Consider Nutritional Consult.  [place] nursing team will not follow, but will remain available to this patient, the nursing and medical teams.  Please re-consult if needed. Thanks, Maudie Flakes, MSN, RN, [place], [facility]  Pager# [phone]

## 2021-04-26 NOTE — ED Notes (Signed)
Pt resting quietly with eyes closed. Normal rise and fall of chest.

## 2021-04-26 NOTE — Progress Notes (Signed)
Subjective/Chief Complaint: No complaints this morning, patient is quite confused   Objective: Vital signs in last 24 hours: Temp:  [98.4 F (36.9 C)-99.6 F (37.6 C)] 99.6 F (37.6 C) (02/20 1521) Pulse Rate:  [31-114] 87 (02/21 0530) Resp:  [13-21] 17 (02/21 0530) BP: (95-138)/(49-115) 134/49 (02/21 0530) SpO2:  [89 %-100 %] 100 % (02/21 0530)    Intake/Output from previous day: 02/20 0701 - 02/21 0700 In: 500 [IV Piggyback:500] Out: 700 [Urine:700] Intake/Output this shift: No intake/output data recorded.  Alert, not coherent Unlabored respirations Stable sacral decubitus ulcer at eschar along base, no erythema, fluctuance, induration or drainage  Lab Results:  Recent Labs    04/25/21 1618  WBC 9.0  HGB 12.0  HCT 37.7  PLT 329   BMET Recent Labs    04/25/21 1618 04/26/21 0145  NA 142 137  K 2.5* 3.8  CL 100 101  CO2 33* 29  GLUCOSE 101* 82  BUN 11 9  CREATININE <0.30* 0.36*  CALCIUM 8.9 8.5*   PT/INR Recent Labs    04/25/21 1618  LABPROT 20.0*  INR 1.7*   ABG No results for input(s): PHART, HCO3 in the last 72 hours.  Invalid input(s): PCO2, PO2  Studies/Results: CT ABDOMEN PELVIS W CONTRAST  Result Date: 04/25/2021 CLINICAL DATA:  Abdominal pain with history of dementia. Unable to provide history. EXAM: CT ABDOMEN AND PELVIS WITH CONTRAST TECHNIQUE: Multidetector CT imaging of the abdomen and pelvis was performed using the standard protocol following bolus administration of intravenous contrast. RADIATION DOSE REDUCTION: This exam was performed according to the departmental dose-optimization program which includes automated exposure control, adjustment of the mA and/or kV according to patient size and/or use of iterative reconstruction technique. CONTRAST:  6mL OMNIPAQUE IOHEXOL 300 MG/ML  SOLN COMPARISON:  CTA chest, abdomen and pelvis 02/12/2021 FINDINGS: Factors affecting image quality: Beam hardening from the patient's arms in the field  and lower extremity flexion contractures. Lower chest: Calcified granuloma again noted in the left lower lobe. There are new trace pleural effusions compared to the prior study. Cardiomegaly is again noted but decreased in the interval. Small pericardial effusion is also new, measuring up to 9 mm. Calcifications circumflex coronary artery. Lung bases are clear of infiltrates. There is bronchial thickening in the lower lobes consistent with bronchitis and/or small airways disease. Hepatobiliary: The liver is unremarkable. There are small stones layering in the gallbladder with mild gallbladder distention. There is mild intrahepatic periportal edema, most typically due to fluid overload. There is no biliary dilatation. Pancreas: Partially atrophic and otherwise unremarkable. Spleen: No mass enhancement or splenomegaly. Adrenals/Urinary Tract: There are left renal cysts up to 3 cm. There are few additional left renal cortical subcentimeter hypodensities which are too small to characterize. There is a 1.1 cm indeterminate heterogeneous lesion of the lower lateral right kidney which could be a complex cyst or a mass (series 3 axial 43). There are no stones or hydronephrosis. There is no adrenal mass. Stomach/Bowel: No dilatation or wall thickening of the small bowel and stomach. An appendix is not seen in this patient. There is moderate retained stool in the transverse colon, moderate impacted stool increased in the rectum with scattered rectal wall pneumatosis. There is perirectal fluid and edema which could be inflammatory or congestive. Vascular/Lymphatic: There is mild-to-moderate aortoiliac atherosclerosis without AAA. No adenopathy is visible. Reproductive: There are calcified fibroids in the uterus, which is positioned to the left. No other adnexal abnormality is visible. There are numerous pelvic phleboliths.  Other: There is interval increased body wall anasarca throughout, increased mesenteric edema throughout.  There is mild scattered ascites in the mesenteric folds and posterior deep pelvis.There is a small umbilical fat hernia. Musculoskeletal: A sacral decubitus ulcer has developed since the prior study and measures 6.2 cm in height, 2.8 cm in depth and 5.1 cm transverse, located right of the midline with extension down to bone. No destructive bone lesion is seen. There is osteopenia with degenerative changes of the spine, advanced L4-5 facet hypertrophy and disc collapse and grade 1, borderline grade 2 L4-5 spondylolisthesis with spinal canal and severe vertical foraminal stenosis. No aggressive bone lesion is seen. IMPRESSION: 1. Constipation is again noted without evidence of small-bowel obstruction, but there is increased rectal fecal impaction, with scattered rectal wall pneumatosis which could be due to wall ischemia or stercoral proctitis. 2. Increased body wall and mesenteric edema, small amount of ascites in the abdomen and pelvis. This could be congestive and/or due to malnutrition/hepatic dysfunction. 3. Increased intrahepatic periportal edema, most likely related to fluid overload or congestive, alternatively could be hepatitis. 4. Cholelithiasis and gallbladder distention without wall thickening or biliary dilatation. 5. Left renal cysts, and a 1.1 cm heterogeneous lesion of the lower lateral right kidney which could be a complex cyst or solid mass. Ultrasound or MRI follow-up recommended. 6. Trace pleural effusions new from prior study, with small new pericardial effusion. Cardiomegaly. 7. Right paramidline sacral decubitus ulcer extending down to bone, also new from 23/76/2831. 8. Umbilical fat hernia. 9. Bilateral lower lobe bronchitis and/or small airways disease. Electronically Signed   By: Telford Nab M.D.   On: 04/25/2021 22:04   DG Chest Port 1 View  Result Date: 04/25/2021 CLINICAL DATA:  Questionable sepsis - evaluate for abnormality EXAM: PORTABLE CHEST 1 VIEW COMPARISON:  Chest x-ray  02/12/2021 FINDINGS: The right heart border is not visualized. Otherwise the heart and mediastinal contours are unchanged. The right heart border aortic calcification. Bilateral apices not well visualized due to overlying mandible. No definite focal consolidation. No pulmonary edema. No pleural effusion. No pneumothorax. No acute osseous abnormality. IMPRESSION: The right heart border is not visualized. This may be due to right middle lobe disease versus patient positioning. Limited evaluation: Bilateral apices not well visualized due to overlying mandible. Recommend repeat PA and lateral view of the chest for further evaluation. Electronically Signed   By: Iven Finn M.D.   On: 04/25/2021 16:11    Anti-infectives: Anti-infectives (From admission, onward)    None       Assessment/Plan:  [name] [name] is an 88 y.o. female with dementia, massive PEs on Eliquis    -There is really no indication to take this patient to the operating room for formal surgical debridement and given the overall picture I think that this is actually best avoided. Looking at her CT scan this looks like be a stage IV with eschar overlying the bone.  Would continue local wound care, offloading, maximize nutrition. -Needs goals of care discussion/palliative care given her advanced dementia and progressive and significant decline  Surgery team will sign off.  Please do not hesitate to call if there is any change in clinical status.    LOS: 1 day    Clovis Riley 04/26/2021

## 2021-04-26 NOTE — Progress Notes (Signed)
PT Cancellation Note  Patient Details Name: DENISSE WHITENACK MRN: [id number] DOB: [birth year]   Cancelled Treatment:    Reason Eval/Treat Not Completed: PT screened, no needs identified, will sign off, Patient  comes from [place] since 12/22. No acute skilled PT needs identified. WOCRN recommends dressing changers per nursing staff. PT will sign off.  Tresa Endo PT Acute Rehabilitation Services Pager [phone] Office [phone]  Claretha Cooper 04/26/2021, 12:54 PM

## 2021-04-27 DIAGNOSIS — L89893 Pressure ulcer of other site, stage 3: Secondary | ICD-10-CM | POA: Diagnosis present

## 2021-04-27 DIAGNOSIS — Z66 Do not resuscitate: Secondary | ICD-10-CM

## 2021-04-27 DIAGNOSIS — Z7189 Other specified counseling: Secondary | ICD-10-CM

## 2021-04-27 DIAGNOSIS — K5900 Constipation, unspecified: Secondary | ICD-10-CM

## 2021-04-27 DIAGNOSIS — K8020 Calculus of gallbladder without cholecystitis without obstruction: Secondary | ICD-10-CM

## 2021-04-27 DIAGNOSIS — Z515 Encounter for palliative care: Secondary | ICD-10-CM

## 2021-04-27 DIAGNOSIS — D638 Anemia in other chronic diseases classified elsewhere: Secondary | ICD-10-CM

## 2021-04-27 LAB — POTASSIUM: Potassium: 4.1 mmol/L (ref 3.5–5.1)

## 2021-04-27 LAB — CBC
HCT: 29.4 % — ABNORMAL LOW (ref 36.0–46.0)
Hemoglobin: 9.2 g/dL — ABNORMAL LOW (ref 12.0–15.0)
MCH: 30.7 pg (ref 26.0–34.0)
MCHC: 31.3 g/dL (ref 30.0–36.0)
MCV: 98 fL (ref 80.0–100.0)
Platelets: 302 10*3/uL (ref 150–400)
RBC: 3 MIL/uL — ABNORMAL LOW (ref 3.87–5.11)
RDW: 13.6 % (ref 11.5–15.5)
WBC: 10.2 10*3/uL (ref 4.0–10.5)
nRBC: 0 % (ref 0.0–0.2)

## 2021-04-27 LAB — COMPREHENSIVE METABOLIC PANEL
ALT: 15 U/L (ref 0–44)
AST: 21 U/L (ref 15–41)
Albumin: 2.1 g/dL — ABNORMAL LOW (ref 3.5–5.0)
Alkaline Phosphatase: 64 U/L (ref 38–126)
Anion gap: 4 — ABNORMAL LOW (ref 5–15)
BUN: 9 mg/dL (ref 8–23)
CO2: 28 mmol/L (ref 22–32)
Calcium: 8.3 mg/dL — ABNORMAL LOW (ref 8.9–10.3)
Chloride: 105 mmol/L (ref 98–111)
Creatinine, Ser: 0.36 mg/dL — ABNORMAL LOW (ref 0.44–1.00)
GFR, Estimated: >60 mL/min (ref >60)
Glucose, Bld: 92 mg/dL (ref 70–99)
Potassium: 2.8 mmol/L — ABNORMAL LOW (ref 3.5–5.1)
Sodium: 137 mmol/L (ref 135–145)
Total Bilirubin: 0.4 mg/dL (ref 0.3–1.2)
Total Protein: 5.9 g/dL — ABNORMAL LOW (ref 6.5–8.1)

## 2021-04-27 LAB — MAGNESIUM: Magnesium: 2 mg/dL (ref 1.7–2.4)

## 2021-04-27 MED ORDER — PANTOPRAZOLE SODIUM 40 MG IV SOLR
40.0000 mg | Freq: Every day | INTRAVENOUS | Status: DC
  Administered 2021-04-27: 40 mg via INTRAVENOUS
  Filled 2021-04-27: qty 10

## 2021-04-27 MED ORDER — POTASSIUM CHLORIDE CRYS ER 20 MEQ PO TBCR
20.0000 meq | EXTENDED_RELEASE_TABLET | Freq: Two times a day (BID) | ORAL | Status: DC
  Administered 2021-04-27 – 2021-04-28 (×3): 20 meq via ORAL
  Filled 2021-04-27 (×3): qty 1

## 2021-04-27 MED ORDER — JUVEN PO PACK
1.0000 | PACK | Freq: Two times a day (BID) | ORAL | Status: DC
  Administered 2021-04-27 – 2021-04-28 (×3): 1 via ORAL
  Filled 2021-04-27 (×3): qty 1

## 2021-04-27 MED ORDER — POTASSIUM CHLORIDE CRYS ER 20 MEQ PO TBCR
40.0000 meq | EXTENDED_RELEASE_TABLET | Freq: Once | ORAL | Status: AC
  Administered 2021-04-27: 40 meq via ORAL
  Filled 2021-04-27: qty 2

## 2021-04-27 MED ORDER — TAMSULOSIN HCL 0.4 MG PO CAPS
0.4000 mg | ORAL_CAPSULE | Freq: Every day | ORAL | Status: DC
  Administered 2021-04-27 – 2021-04-28 (×2): 0.4 mg via ORAL
  Filled 2021-04-27 (×2): qty 1

## 2021-04-27 MED ORDER — TRAMADOL HCL 50 MG PO TABS
50.0000 mg | ORAL_TABLET | Freq: Four times a day (QID) | ORAL | Status: DC | PRN
  Administered 2021-04-27 – 2021-04-28 (×2): 50 mg via ORAL
  Filled 2021-04-27 (×2): qty 1

## 2021-04-27 MED ORDER — ADULT MULTIVITAMIN W/MINERALS CH
1.0000 | ORAL_TABLET | Freq: Every day | ORAL | Status: DC
  Administered 2021-04-27 – 2021-04-28 (×2): 1 via ORAL
  Filled 2021-04-27 (×2): qty 1

## 2021-04-27 MED ORDER — POTASSIUM CHLORIDE 10 MEQ/100ML IV SOLN
10.0000 meq | INTRAVENOUS | Status: AC
  Administered 2021-04-27 (×3): 10 meq via INTRAVENOUS
  Filled 2021-04-27 (×3): qty 100

## 2021-04-27 NOTE — Assessment & Plan Note (Signed)
Alert awake oriented to self pleasantly confused continue fall precaution supportive care

## 2021-04-27 NOTE — Assessment & Plan Note (Signed)
Continue statin. 

## 2021-04-27 NOTE — Assessment & Plan Note (Addendum)
Continue wound care as instructed

## 2021-04-27 NOTE — Assessment & Plan Note (Signed)
BP stable.  Not on meds.

## 2021-04-27 NOTE — Assessment & Plan Note (Signed)
Asymptomatic. 

## 2021-04-27 NOTE — Assessment & Plan Note (Signed)
Heart rate controlled, continue Eliquis.

## 2021-04-27 NOTE — TOC Initial Note (Signed)
Transition of Care [facility]) - Initial/Assessment Note    Patient Details  Name: [name] [name] MRN: [id number] Date of Birth: Jul 07, 1931  [facility] CM/SW Contact:    Lynnell Catalan, RN Phone Number: 04/27/2021, 12:26 PM  Clinical Narrative:                 Pt came from [facility] where she was there for rehab. Spoke with daughter [name] who states she would love to have her home with her but because she is recovering from a CVA herself she would need assistance with her care. She states that pt can hopefully go back to [facility]. She completed a Medicaid application and turned it into [facility] recently.   Spoke with Claiborne Billings from [facility] to inform her that pt has not been very mobile with therapy. Claiborne Billings hopes to get insurance auth for SNF for wound care. Claiborne Billings to initial insurance auth. TOC will continue to follow.  Expected Discharge Plan: [place] Barriers to Discharge: Continued Medical Work up        Expected Discharge Plan and Services Expected Discharge Plan: [place]   Discharge Planning Services: CM Consult   Living arrangements for the past 2 months: [place]                                      Prior Living Arrangements/Services Living arrangements for the past 2 months: [place] Lives with:: Facility Resident Patient language and need for interpreter reviewed:: Yes        Need for Family Participation in Patient Care: Yes (Comment) Care giver support system in place?: Yes (comment)   Criminal Activity/Legal Involvement Pertinent to Current Situation/Hospitalization: No - Comment as needed  Activities of Daily Living Home Assistive Devices/Equipment: None ADL Screening (condition at time of admission) Patient's cognitive ability adequate to safely complete daily activities?: No Is the patient deaf or have difficulty hearing?: No Does the patient have difficulty seeing, even when  wearing glasses/contacts?: No Does the patient have difficulty concentrating, remembering, or making decisions?: Yes Patient able to express need for assistance with ADLs?: No Does the patient have difficulty dressing or bathing?: Yes Independently performs ADLs?: No Does the patient have difficulty walking or climbing stairs?: Yes Weakness of Legs: Both Weakness of Arms/Hands: Both  Permission Sought/Granted Permission sought to share information with : Facility Sport and exercise psychologist                Emotional Assessment Appearance:: Appears stated age     Orientation: : Fluctuating Orientation (Suspected and/or reported Sundowners) Alcohol / Substance Use: Not Applicable Psych Involvement: No (comment)  Admission diagnosis:  Hypokalemia [E87.6] Decubitus ulcer [L89.90] Pressure injury of sacral region, unstageable ([place]) [L89.150] Patient Active Problem List   Diagnosis Date Noted   Constipation 04/27/2021   Cholelithiasis 04/27/2021   Decubitus ulcer of right knee, stage 3 ([place]) 04/27/2021   Anemia of chronic disease 04/27/2021   Decubitus ulcer of sacral region, unstageable ([place]) 04/25/2021   Hypokalemia 04/25/2021   Functional quadriplegia ([place]) 04/25/2021   Paroxysmal atrial fibrillation ([place]) 04/25/2021   Protein-calorie malnutrition, severe 02/14/2021   Pressure injury of skin 02/13/2021   Pulmonary embolism ([place]) 02/12/2021   Shock ([place])    AKI (acute kidney injury) ([place])    History of cataract extraction 10/12/2020   Gastroesophageal reflux disease 10/12/2020   Allergic rhinitis 10/12/2020  Alzheimer's disease ([place]) 10/12/2020   Mild obstructive sleep apnea-hypopnea syndrome 12/31/2018   Episodes of formed visual hallucinations 12/31/2018   OAB (overactive bladder) 09/21/2016   Atherosclerotic PVD with intermittent claudication ([place]) 03/21/2012   Hyperlipidemia 06/28/2011   Essential hypertension, benign 06/28/2011   Osteoarthritis 06/28/2011    Cardiomegaly 02/07/2011   PCP:  Denita Lung, MD Pharmacy:   [facility]) [place] - [address] [place] [address] Phone: [phone] Fax: [phone]  [facility]) [place], [place] [address] [place] [address] Phone: [phone] Fax: [phone]  [facility] - [place], [place] [address] Phone: [phone] Fax: [place], [place] [address] [place] AT [facility] [place] [address] Phone: [phone] Fax: [phone]  [facility] [address] [place] [address] Phone: [phone] Fax: [phone]     Social Determinants of Health (SDOH) Interventions    Readmission Risk Interventions No flowsheet data found.

## 2021-04-27 NOTE — Assessment & Plan Note (Addendum)
Being repleted IV and p.o. and resolved. Recent Labs  Lab 04/25/21 1618 04/26/21 0145 04/26/21 1316 04/27/21 0552 04/27/21 1454 04/28/21 0542  K 2.5* 3.8 2.9* 2.8* 4.1 4.3  CALCIUM 8.9 8.5* 8.7* 8.3*  --  8.3*  MG 1.9  --  1.7 2.0  --   --   PHOS  --  2.9 2.5  --   --   --

## 2021-04-27 NOTE — Progress Notes (Signed)
PROGRESS NOTE [name] [name]  GXQ:[id number] DOB: 12-20-1931 DOA: 04/25/2021 PCP: Denita Lung, MD   Brief Narrative/Hospital Course: 88 year old female with medical history of dementia, massive PE on Eliquis, chronic diastolic CHF, hypertension, paroxysmal atrial fibrillation, dementia, presented from skilled nursing facility for sacral wound.  Seen by wound care recently and was told to go to the ER for surgical debridement, she has been bedbound since December 2022 has been there for past few months.  Wound care physician told her to go to the ER for surgical debridement.  Patient has been bedbound since December 2022. Seen in the ED underwent CT abdomen pelvis General surgery was consulted they feel likely with scar overlying the bone recommended wound care and no surgical debridement advised.  Wound care has seen the patient, diagnosed with unstageable pressure injury to sacrum and stage III as needed to right immediately, being managed with topical wound care nutritional support, turning and repositioning.  Palliative was consulted to discuss further goals of care given patient's debilitated state old age and and full code on admission.  Patient having significant constipation with stage coral proctitis or wall ischemia discussed with GI advised MiraLAX and enema, had BMX2.    Subjective: Seen examined this morning.  Alert awake oriented to self date of birth but not to place Received laxatives and had BM x2 Overnight no fever.  Labs showed low potassium  Assessment and Plan: * Decubitus ulcer of sacral region, unstageable ([place])- (present on admission) Seen by surgery advised topical treatment, wound care on board continue current wound care topical management, repositioning/frequent changing.  Augment nutritional status.  Decubitus ulcer of right knee, stage 3 (HCC)- (present on admission) Continue wound care  Hypokalemia- (present on admission) Being repleted IV and p.o. this  morning.mag nl. Recent Labs  Lab 04/25/21 1618 04/26/21 0145 04/26/21 1316 04/27/21 0552  K 2.5* 3.8 2.9* 2.8*  CALCIUM 8.9 8.5* 8.7* 8.3*  MG 1.9  --  1.7 2.0  PHOS  --  2.9 2.5  --      Alzheimer's disease ([place])- (present on admission) Alert awake oriented to self pleasantly confused continue fall precaution supportive care  Paroxysmal atrial fibrillation ([place])- (present on admission) Heart rate controlled, continue Eliquis.  Pulmonary embolism ([place])- (present on admission) History of PE continue Eliquis  Constipation CT scan with rectal fecal impaction scattered rectal wall pneumatosis could be due to bowel ischemia or stercoral proctitis, discussed with Dr. Michail Sermon, patient had a bowel movement yesterday continue with current plan multiport enema/MiraLAX.  Goals of care, counseling/discussion Given patient multiple comorbidities, she will benefit with palliative care evaluation consulted on board, anticipating discussion today.  Anemia of chronic disease Hemoglobin is stable-noted slight drop initially likely hemoconcentrated.  Baseline at 10.1 g 2 months ago Recent Labs  Lab 04/25/21 1618 04/26/21 1316 04/27/21 0552  HGB 12.0 11.0* 9.2*  HCT 37.7 34.5* 29.4*    Cholelithiasis Asymptomatic.  Functional quadriplegia (HCC)- (present on admission) PT OT supportive care with plan to return to skilled nursing facility for short-term rehabilitation  Protein-calorie malnutrition, severe- (present on admission) Augment nutritional status as below Nutrition Problem: Severe Malnutrition Etiology: chronic illness (Alzheimer's dementia) Signs/Symptoms: moderate fat depletion, severe muscle depletion, energy intake < or equal to 75% for > or equal to 1 month Interventions: Ensure Enlive (each supplement provides 350kcal and 20 grams of protein), MVI, Juven     Essential hypertension, benign- (present on admission) BP stable.  Not on meds.  Hyperlipidemia- (present  on admission)  Continue statin   DVT prophylaxis: ELIQUIS Code Status:   Code Status: Full Code Family Communication: plan of care discussed with patient at bedside.  Disposition: Currently not medically stable for discharge. Status is: Inpatient Remains inpatient appropriate because: Ongoing management of electrolyte imbalance Anticipating discharge back to facility 2/23  Objective: Vitals last 24 hrs: Vitals:   04/26/21 1648 04/26/21 1838 04/26/21 2025 04/27/21 0612  BP:  122/78 138/82 (!) 113/57  Pulse:  94 99 98  Resp: 18 16 16 16   Temp:  98.2 F (36.8 C) 98.3 F (36.8 C) 98.3 F (36.8 C)  TempSrc:  Oral Oral Oral  SpO2:  100% 96% 100%   Weight change:   Physical Examination: General exam: AA0x2,older than stated age, weak appearing. HEENT:Oral mucosa moist, Ear/Nose WNL grossly, dentition normal. Respiratory system: bilaterally diminished BS, no use of accessory muscle Cardiovascular system: S1 & S2 +, No JVD,. Gastrointestinal system: Abdomen soft,NT,ND, BS+ Nervous System:Alert, awake, moving extremities and grossly nonfocal Extremities: LE edema none, resting good contracted/flexed knees Skin: No rashes,no icterus. MSK: thin muscle bulk,tone, power  Medications reviewed:  Scheduled Meds:  apixaban  5 mg Oral BID   feeding supplement  237 mL Oral TID BM   ferrous sulfate  325 mg Oral Q breakfast   memantine  14 mg Oral Daily   mirtazapine  7.5 mg Oral QHS   multivitamin with minerals  1 tablet Oral Daily   nutrition supplement (JUVEN)  1 packet Oral BID BM   pantoprazole (PROTONIX) IV  40 mg Intravenous Daily   potassium chloride  20 mEq Oral BID   simvastatin  40 mg Oral QHS   sodium hypochlorite   Topical BID   Continuous Infusions:  potassium chloride 10 mEq (04/27/21 1141)    Pressure Injury 02/13/21 Sacrum Mid Stage 2 -  Partial thickness loss of dermis presenting as a shallow open injury with a red, pink wound bed without slough. pink (Active)   02/13/21 0200  Location: Sacrum  Location Orientation: Mid  Staging: Stage 2 -  Partial thickness loss of dermis presenting as a shallow open injury with a red, pink wound bed without slough.  Wound Description (Comments): pink  Present on Admission: Yes     Pressure Injury 02/13/21 Buttocks Right Stage 2 -  Partial thickness loss of dermis presenting as a shallow open injury with a red, pink wound bed without slough. pink (Active)  02/13/21 0200  Location: Buttocks  Location Orientation: Right  Staging: Stage 2 -  Partial thickness loss of dermis presenting as a shallow open injury with a red, pink wound bed without slough.  Wound Description (Comments): pink  Present on Admission: Yes   Diet Order             Diet Heart Room service appropriate? Yes; Fluid consistency: Thin  Diet effective now                    Nutrition Problem: Severe Malnutrition Etiology: chronic illness (Alzheimer's dementia) Signs/Symptoms: moderate fat depletion, severe muscle depletion, energy intake < or equal to 75% for > or equal to 1 month Interventions: Ensure Enlive (each supplement provides 350kcal and 20 grams of protein), MVI, Juven   Intake/Output Summary (Last 24 hours) at 04/27/2021 1229 Last data filed at 04/27/2021 0311 Gross per 24 hour  Intake 697.94 ml  Output --  Net 697.94 ml   Net IO Since Admission: 497.94 mL [04/27/21 1229]  Wt Readings from Last 3 Encounters:  02/23/21 58.6 kg  11/16/20 60.3 kg  10/12/20 59.7 kg     Unresulted Labs (From admission, onward)     Start     Ordered   04/28/21 0938  Basic metabolic panel  Tomorrow morning,   R        04/27/21 0807   04/28/21 0500  CBC  Tomorrow morning,   R        04/27/21 0812   04/27/21 1500  Potassium  Once-Timed,   TIMED        04/27/21 1829          Data Reviewed: I have personally reviewed following labs and imaging studies CBC: Recent Labs  Lab 04/25/21 1618 04/26/21 1316 04/27/21 0552  WBC 9.0  10.0 10.2  NEUTROABS 6.3 7.9*  --   HGB 12.0 11.0* 9.2*  HCT 37.7 34.5* 29.4*  MCV 96.7 97.2 98.0  PLT 329 307 937   Basic Metabolic Panel: Recent Labs  Lab 04/25/21 1618 04/26/21 0145 04/26/21 1316 04/27/21 0552  NA 142 137 138 137  K 2.5* 3.8 2.9* 2.8*  CL 100 101 101 105  CO2 33* 29 28 28   GLUCOSE 101* 82 82 92  BUN 11 9 8 9   CREATININE <0.30* 0.36* 0.49 0.36*  CALCIUM 8.9 8.5* 8.7* 8.3*  MG 1.9  --  1.7 2.0  PHOS  --  2.9 2.5  --    GFR: CrCl cannot be calculated (Unknown ideal weight.). Liver Function Tests: Recent Labs  Lab 04/25/21 1618 04/26/21 0145 04/26/21 1316 04/27/21 0552  AST 24 38 20 21  ALT 20 17 17 15   ALKPHOS 88 84 84 64  BILITOT 0.6 1.5* 1.0 0.4  PROT 7.4 6.3* 6.8 5.9*  ALBUMIN 2.7* 2.4* 2.4* 2.1*   No results for input(s): LIPASE, AMYLASE in the last 168 hours. No results for input(s): AMMONIA in the last 168 hours. Coagulation Profile: Recent Labs  Lab 04/25/21 1618  INR 1.7*   Cardiac Enzymes: Recent Labs  Lab 04/26/21 0145  CKTOTAL 112   BNP (last 3 results) No results for input(s): PROBNP in the last 8760 hours. HbA1C: No results for input(s): HGBA1C in the last 72 hours. CBG: No results for input(s): GLUCAP in the last 168 hours. Lipid Profile: No results for input(s): CHOL, HDL, LDLCALC, TRIG, CHOLHDL, LDLDIRECT in the last 72 hours. Thyroid Function Tests: Recent Labs    04/26/21 1316  TSH 2.344   Anemia Panel: No results for input(s): VITAMINB12, FOLATE, FERRITIN, TIBC, IRON, RETICCTPCT in the last 72 hours. Sepsis Labs: Recent Labs  Lab 04/25/21 1618  LATICACIDVEN 1.5    Recent Results (from the past 240 hour(s))  Blood Culture (routine x 2)     Status: None (Preliminary result)   Collection Time: 04/25/21  4:18 PM   Specimen: BLOOD  Result Value Ref Range Status   Specimen Description   Final    BLOOD BLOOD RIGHT FOREARM Performed at [facility] [address] [place], [place]  [address]    Special Requests   Final    BOTTLES DRAWN AEROBIC AND ANAEROBIC Blood Culture results may not be optimal due to an inadequate volume of blood received in culture bottles Performed at [facility] [address] [place], [place] [address]    Culture   Final    NO GROWTH 2 DAYS Performed at [facility] [address] [place], [place] [address]    Report Status PENDING  Incomplete  Blood Culture (routine x 2)     Status: None (Preliminary result)   Collection Time: 04/25/21  4:18 PM   Specimen: BLOOD  Result Value Ref Range Status   Specimen Description   Final    BLOOD RIGHT ANTECUBITAL Performed at [facility] [address], [place], [place] [address]    Special Requests   Final    BOTTLES DRAWN AEROBIC AND ANAEROBIC Blood Culture results may not be optimal due to an inadequate volume of blood received in culture bottles Performed at [facility] [address] [place], [place] [address]    Culture   Final    NO GROWTH 2 DAYS Performed at [facility] [address] [place], [place] [address]    Report Status PENDING  Incomplete  Resp Panel by RT-PCR (Flu A&B, Covid) Nasopharyngeal Swab     Status: None   Collection Time: 04/25/21  8:38 PM   Specimen: Nasopharyngeal Swab; Nasopharyngeal(NP) swabs in vial transport medium  Result Value Ref Range Status   SARS Coronavirus 2 by RT PCR NEGATIVE NEGATIVE Final    Comment: (NOTE) SARS-CoV-2 target nucleic acids are NOT DETECTED.  The SARS-CoV-2 RNA is generally detectable in upper respiratory specimens during the acute phase of infection. The lowest concentration of SARS-CoV-2 viral copies this assay can detect is 138 copies/mL. A negative result does not preclude SARS-Cov-2 infection and should not be used as the sole basis for treatment or other patient management decisions. A negative result may occur with  improper specimen  collection/handling, submission of specimen other than nasopharyngeal swab, presence of viral mutation(s) within the areas targeted by this assay, and inadequate number of viral copies(<138 copies/mL). A negative result must be combined with clinical observations, patient history, and epidemiological information. The expected result is Negative.  Fact Sheet for Patients:  EntrepreneurPulse.com.au  Fact Sheet for Healthcare Providers:  IncredibleEmployment.be  This test is no t yet approved or cleared by the [place] FDA and  has been authorized for detection and/or diagnosis of SARS-CoV-2 by FDA under an Emergency Use Authorization (EUA). This EUA will remain  in effect (meaning this test can be used) for the duration of the COVID-19 declaration under Section 564(b)(1) of the Act, 21 U.S.C.section 360bbb-3(b)(1), unless the authorization is terminated  or revoked sooner.       Influenza A by PCR NEGATIVE NEGATIVE Final   Influenza B by PCR NEGATIVE NEGATIVE Final    Comment: (NOTE) The Xpert Xpress SARS-CoV-2/FLU/RSV plus assay is intended as an aid in the diagnosis of influenza from Nasopharyngeal swab specimens and should not be used as a sole basis for treatment. Nasal washings and aspirates are unacceptable for Xpert Xpress SARS-CoV-2/FLU/RSV testing.  Fact Sheet for Patients: EntrepreneurPulse.com.au  Fact Sheet for Healthcare Providers: IncredibleEmployment.be  This test is not yet approved or cleared by the [place] FDA and has been authorized for detection and/or diagnosis of SARS-CoV-2 by FDA under an Emergency Use Authorization (EUA). This EUA will remain in effect (meaning this test can be used) for the duration of the COVID-19 declaration under Section 564(b)(1) of the Act, 21 U.S.C. section 360bbb-3(b)(1), unless the authorization is terminated or revoked.  Performed at [facility], [place] [address], [place], [place] [address]     Antimicrobials: Anti-infectives (From admission, onward)    None      Culture/Microbiology    Component Value Date/Time   SDES  04/25/2021 1618    BLOOD  BLOOD RIGHT FOREARM Performed at [facility] [address], [place], [place] [address]    SDES  04/25/2021 1618    BLOOD RIGHT ANTECUBITAL Performed at [facility], [place] [address], [place], [place] [address]    SPECREQUEST  04/25/2021 1618    BOTTLES DRAWN AEROBIC AND ANAEROBIC Blood Culture results may not be optimal due to an inadequate volume of blood received in culture bottles Performed at [facility], [place] [address], [place], [place] [address]    SPECREQUEST  04/25/2021 1618    BOTTLES DRAWN AEROBIC AND ANAEROBIC Blood Culture results may not be optimal due to an inadequate volume of blood received in culture bottles Performed at [facility], [place] [address] [place], [place] [address]    CULT  04/25/2021 1618    NO GROWTH 2 DAYS Performed at [facility] [address] [place],  [address]    CULT  04/25/2021 1618    NO GROWTH 2 DAYS Performed at [facility], [place] [address], [place],  [address]    REPTSTATUS PENDING 04/25/2021 1618   REPTSTATUS PENDING 04/25/2021 1618    Other culture-see note  Radiology Studies: CT ABDOMEN PELVIS W CONTRAST  Result Date: 04/25/2021 CLINICAL DATA:  Abdominal pain with history of dementia. Unable to provide history. EXAM: CT ABDOMEN AND PELVIS WITH CONTRAST TECHNIQUE: Multidetector CT imaging of the abdomen and pelvis was performed using the standard protocol following bolus administration of intravenous contrast. RADIATION DOSE REDUCTION: This exam was performed according to the departmental dose-optimization program which includes automated exposure control, adjustment of the mA and/or kV according to  patient size and/or use of iterative reconstruction technique. CONTRAST:  48mL OMNIPAQUE IOHEXOL 300 MG/ML  SOLN COMPARISON:  CTA chest, abdomen and pelvis 02/12/2021 FINDINGS: Factors affecting image quality: Beam hardening from the patient's arms in the field and lower extremity flexion contractures. Lower chest: Calcified granuloma again noted in the left lower lobe. There are new trace pleural effusions compared to the prior study. Cardiomegaly is again noted but decreased in the interval. Small pericardial effusion is also new, measuring up to 9 mm. Calcifications circumflex coronary artery. Lung bases are clear of infiltrates. There is bronchial thickening in the lower lobes consistent with bronchitis and/or small airways disease. Hepatobiliary: The liver is unremarkable. There are small stones layering in the gallbladder with mild gallbladder distention. There is mild intrahepatic periportal edema, most typically due to fluid overload. There is no biliary dilatation. Pancreas: Partially atrophic and otherwise unremarkable. Spleen: No mass enhancement or splenomegaly. Adrenals/Urinary Tract: There are left renal cysts up to 3 cm. There are few additional left renal cortical subcentimeter hypodensities which are too small to characterize. There is a 1.1 cm indeterminate heterogeneous lesion of the lower lateral right kidney which could be a complex cyst or a mass (series 3 axial 43). There are no stones or hydronephrosis. There is no adrenal mass. Stomach/Bowel: No dilatation or wall thickening of the small bowel and stomach. An appendix is not seen in this patient. There is moderate retained stool in the transverse colon, moderate impacted stool increased in the rectum with scattered rectal wall pneumatosis. There is perirectal fluid and edema which could be inflammatory or congestive. Vascular/Lymphatic: There is mild-to-moderate aortoiliac atherosclerosis without AAA. No adenopathy is visible. Reproductive:  There are calcified fibroids in the uterus, which is positioned to the left. No other adnexal abnormality is visible. There are numerous pelvic phleboliths. Other:  There is interval increased body wall anasarca throughout, increased mesenteric edema throughout. There is mild scattered ascites in the mesenteric folds and posterior deep pelvis.There is a small umbilical fat hernia. Musculoskeletal: A sacral decubitus ulcer has developed since the prior study and measures 6.2 cm in height, 2.8 cm in depth and 5.1 cm transverse, located right of the midline with extension down to bone. No destructive bone lesion is seen. There is osteopenia with degenerative changes of the spine, advanced L4-5 facet hypertrophy and disc collapse and grade 1, borderline grade 2 L4-5 spondylolisthesis with spinal canal and severe vertical foraminal stenosis. No aggressive bone lesion is seen. IMPRESSION: 1. Constipation is again noted without evidence of small-bowel obstruction, but there is increased rectal fecal impaction, with scattered rectal wall pneumatosis which could be due to wall ischemia or stercoral proctitis. 2. Increased body wall and mesenteric edema, small amount of ascites in the abdomen and pelvis. This could be congestive and/or due to malnutrition/hepatic dysfunction. 3. Increased intrahepatic periportal edema, most likely related to fluid overload or congestive, alternatively could be hepatitis. 4. Cholelithiasis and gallbladder distention without wall thickening or biliary dilatation. 5. Left renal cysts, and a 1.1 cm heterogeneous lesion of the lower lateral right kidney which could be a complex cyst or solid mass. Ultrasound or MRI follow-up recommended. 6. Trace pleural effusions new from prior study, with small new pericardial effusion. Cardiomegaly. 7. Right paramidline sacral decubitus ulcer extending down to bone, also new from 16/12/9602. 8. Umbilical fat hernia. 9. Bilateral lower lobe bronchitis and/or  small airways disease. Electronically Signed   By: Telford Nab M.D.   On: 04/25/2021 22:04   DG Chest Port 1 View  Result Date: 04/25/2021 CLINICAL DATA:  Questionable sepsis - evaluate for abnormality EXAM: PORTABLE CHEST 1 VIEW COMPARISON:  Chest x-ray 02/12/2021 FINDINGS: The right heart border is not visualized. Otherwise the heart and mediastinal contours are unchanged. The right heart border aortic calcification. Bilateral apices not well visualized due to overlying mandible. No definite focal consolidation. No pulmonary edema. No pleural effusion. No pneumothorax. No acute osseous abnormality. IMPRESSION: The right heart border is not visualized. This may be due to right middle lobe disease versus patient positioning. Limited evaluation: Bilateral apices not well visualized due to overlying mandible. Recommend repeat PA and lateral view of the chest for further evaluation. Electronically Signed   By: Iven Finn M.D.   On: 04/25/2021 16:11     LOS: 2 days   Antonieta Pert, MD [facility]  04/27/2021, 12:29 PM

## 2021-04-27 NOTE — Assessment & Plan Note (Signed)
PT OT supportive care with plan to return to skilled nursing facility for short-term rehabilitation

## 2021-04-27 NOTE — Assessment & Plan Note (Addendum)
CT scan with rectal fecal impaction scattered rectal wall pneumatosis could be due to bowel ischemia or stercoral proctitis, discussed with Dr. Michail Sermon, she had bowel movements 2 x 2/22, she will continue with her laxative regimen and as needed enema at the facility

## 2021-04-27 NOTE — Assessment & Plan Note (Signed)
History of PE continue Eliquis

## 2021-04-27 NOTE — Assessment & Plan Note (Signed)
Augment nutritional status as below Nutrition Problem: Severe Malnutrition Etiology: chronic illness (Alzheimer's dementia) Signs/Symptoms: moderate fat depletion, severe muscle depletion, energy intake < or equal to 75% for > or equal to 1 month Interventions: Ensure Enlive (each supplement provides 350kcal and 20 grams of protein), MVI, Juven

## 2021-04-27 NOTE — Evaluation (Addendum)
fOccupational Therapy Evaluation Patient Details Name: [name] [name] MRN: [id number] DOB: [birth year] Today's Date: 04/27/2021   History of Present Illness 88 year old female with medical history of dementia, massive PE on Eliquis, hypertension, paroxysmal atrial fibrillation presented with worsening decubitus ulcer.  Patient has history of advanced dementia   Clinical Impression   [name] [name] is an 88 year old woman admitted to hospital with above medical history. On evaluation she is unable to state her name or participate in meaningful conversation -with her responses being mostly inappropriate to questions asked. She tolerates therapist passively ranging upper extremities to assess for contractures but does not tolerate movement of lower extremities. She is on her right side with hips and knees flexed into balled position - she does not tolerate any movement of lower extremities as she immediately begins to exhibit pain behaviors and pushing therapist hands away. Though unable to fully assess - expect lower extremity contractures. She is unable to significantly assist with grooming task (washing her face). She falls asleep almost instantly without constant stimuli. Patient required max-total assist for ADLs and bed mobility two months ago while in hospital. Today she is total assist and bed bound. Patient has no therapy potential due to bed bound status and dementia. Recommend return to facility for 24/7 care.       Recommendations for follow up therapy are one component of a multi-disciplinary discharge planning process, led by the attending physician.  Recommendations may be updated based on patient status, additional functional criteria and insurance authorization.   Follow Up Recommendations  Long-term institutional care without follow-up therapy    Assistance Recommended at Discharge Frequent or constant Supervision/Assistance  Patient can return home with the following Two people to  help with walking and/or transfers;Two people to help with bathing/dressing/bathroom;Direct supervision/assist for medications management;Assist for transportation;Assistance with feeding;Assistance with cooking/housework    Functional Status Assessment  Patient has not had a recent decline in their functional status  Equipment Recommendations  None recommended by OT    Recommendations for Other Services       Precautions / Restrictions Precautions Precaution Comments: sacral wounds      Mobility Bed Mobility               General bed mobility comments: Total care for bed mobility.    Transfers                              ADL either performed or assessed with clinical judgement   ADL Overall ADL's : At baseline                                       General ADL Comments: Total care. Patient able to bring wash cloth to mouth but does not complete grooming task. Overall total care and requires assistance for feeding.     Vision   Vision Assessment?: No apparent visual deficits            Pertinent Vitals/Pain Pain Assessment Pain Assessment: PAINAD (with attempts to extend legs) Breathing: normal Negative Vocalization: occasional moan/groan, low speech, negative/disapproving quality Facial Expression: facial grimacing Body Language: rigid, fists clenched, knees up, pushing/pulling away, strikes out Consolability: distracted or reassured by voice/touch PAINAD Score: 6 Pain Intervention(s): Repositioned     Hand Dominance     Extremity/Trunk Assessment Upper Extremity Assessment Upper Extremity Assessment:  RUE deficits/detail;LUE deficits/detail;Difficult to assess due to impaired cognition RUE Deficits / Details: WFL PROM - unable to follow commands LUE Deficits / Details: WFL PROM - unable to follow commands   Lower Extremity Assessment Lower Extremity Assessment:  (Patient on her right side with hips and knees flexed into  a balled position - does not tolerate attempt to extend legs or abduct hips- she begins to moan/cry and push therapist hands away. Expect contractures - but unable to assess enough.)       Communication Communication Communication: Expressive difficulties;Receptive difficulties   Cognition Arousal/Alertness: Lethargic Behavior During Therapy: WFL for tasks assessed/performed Overall Cognitive Status: History of cognitive impairments - at baseline                                 General Comments: Patient unable to state her name. All conversation and responses are inappropriate to questions.                Home Living Family/patient expects to be discharged to:: Skilled nursing facility                                        Prior Functioning/Environment Prior Level of Function : Needs assist             Mobility Comments: bed bound ADLs Comments: total care        OT Problem List:        OT Treatment/Interventions:      OT Goals(Current goals can be found in the care plan section)    OT Frequency:      Co-evaluation              AM-PAC OT "6 Clicks" Daily Activity     Outcome Measure Help from another person eating meals?: Total Help from another person taking care of personal grooming?: Total Help from another person toileting, which includes using toliet, bedpan, or urinal?: Total Help from another person bathing (including washing, rinsing, drying)?: Total Help from another person to put on and taking off regular upper body clothing?: Total Help from another person to put on and taking off regular lower body clothing?: Total 6 Click Score: 6   End of Session Nurse Communication:  (okay to see)  Activity Tolerance: Patient limited by pain Patient left: in bed  OT Visit Diagnosis: Pain                Time: 0092-3300 OT Time Calculation (min): 7 min Charges:  OT General Charges $OT Visit: 1 Visit OT  Evaluation $OT Eval Low Complexity: 1 Low  [name] [name], OTR/L [place]  Office [phone] Pager: [phone]   Lenward Chancellor 04/27/2021, 9:53 AM

## 2021-04-27 NOTE — Plan of Care (Signed)

## 2021-04-27 NOTE — Discharge Summary (Signed)
Physician Discharge Summary  SHOMARI MATUSIK VHQ:[id number] DOB: 1931-06-07 DOA: 04/25/2021  PCP: Denita Lung, MD  Admit date: 04/25/2021 Discharge date: 04/28/2021  Admitted From: SNF Disposition:  SNF  Recommendations for Outpatient Follow-up:  Follow up with PCP in 1-2 weeks Please obtain BMP/CBC in one week  Home Health:NO  Equipment/Devices: NONE  Discharge Condition: Stable Code Status:   Code Status: DNR Diet recommendation:  Diet Order             Diet - low sodium heart healthy           Diet Heart Room service appropriate? Yes; Fluid consistency: Thin  Diet effective now                    Brief/Interim Summary: 88 year old female with medical history of dementia, massive PE on Eliquis, chronic diastolic CHF, hypertension, paroxysmal atrial fibrillation, dementia, presented from skilled nursing facility for sacral wound.  Seen by wound care recently and was told to go to the ER for surgical debridement, she has been bedbound since December 2022 has been there for past few months.  Wound care physician told her to go to the ER for surgical debridement.  Patient has been bedbound since December 2022. Seen in the ED underwent CT abdomen pelvis General surgery was consulted they feel likely with scar overlying the bone recommended wound care and no surgical debridement advised.  Wound care has seen the patient, diagnosed with unstageable pressure injury to sacrum and stage III as needed to right immediately, being managed with topical wound care nutritional support, turning and repositioning.  Palliative was consulted to discuss further goals of care given patient's debilitated state old age and and full code on admission.  Patient having significant constipation with stage coral proctitis or wall ischemia discussed with GI advised MiraLAX and enema, had BMX2. She had some urine retention placed on Flomax needed in and out cath x1 2/22 now void.  Doing well monitor voiding at  the facility if has retention can consider in and out versus Foley catheter at SNF. She has been voiding well with flomax.  Discharge Diagnoses:  * Decubitus ulcer of sacral region, unstageable [facility])- (present on admission) Seen by surgery advised topical treatment, wound care on board continue current wound care topical management, repositioning/frequent changing.  Augment nutritional status.  Decubitus ulcer of right knee, stage 3 ([place])- (present on admission) Continue wound care as instructed  Hypokalemia- (present on admission) Being repleted IV and p.o. and resolved. Recent Labs  Lab 04/25/21 1618 04/26/21 0145 04/26/21 1316 04/27/21 0552 04/27/21 1454 04/28/21 0542  K 2.5* 3.8 2.9* 2.8* 4.1 4.3  CALCIUM 8.9 8.5* 8.7* 8.3*  --  8.3*  MG 1.9  --  1.7 2.0  --   --   PHOS  --  2.9 2.5  --   --   --      Alzheimer's disease ([place])- (present on admission) Alert awake oriented to self pleasantly confused continue fall precaution supportive care  Paroxysmal atrial fibrillation ([place])- (present on admission) Heart rate controlled, continue Eliquis.  Pulmonary embolism ([place])- (present on admission) History of PE continue Eliquis  Constipation CT scan with rectal fecal impaction scattered rectal wall pneumatosis could be due to bowel ischemia or stercoral proctitis, discussed with Dr. Michail Sermon, she had bowel movements 2 x 2/22, she will continue with her laxative regimen and as needed enema at the facility  Goals of care, counseling/discussion Given patient multiple comorbidities, she will benefit  with palliative care evaluation and seen by palliative care at this time she is DNR advised follow-up with palliative care at the facility.    Anemia of chronic disease Hemoglobin is slightly downtrending no active bleeding noted.  We will advise to check CBC in 5 to 7 days at the facility given that she is on Eliquis. Recent Labs  Lab 04/25/21 1618 04/26/21 1316 04/27/21 0552  04/28/21 0542  HGB 12.0 11.0* 9.2* 8.9*  HCT 37.7 34.5* 29.4* 28.6*    Cholelithiasis Asymptomatic.  Functional quadriplegia (HCC)- (present on admission) PT OT supportive care with plan to return to skilled nursing facility for short-term rehabilitation  Protein-calorie malnutrition, severe- (present on admission) Augment nutritional status as below Nutrition Problem: Severe Malnutrition Etiology: chronic illness (Alzheimer's dementia) Signs/Symptoms: moderate fat depletion, severe muscle depletion, energy intake < or equal to 75% for > or equal to 1 month Interventions: Ensure Enlive (each supplement provides 350kcal and 20 grams of protein), MVI, Juven     Essential hypertension, benign- (present on admission) BP stable.  Not on meds.  Hyperlipidemia- (present on admission) Continue statin    Pressure Ulcer: Pressure Injury 02/13/21 Sacrum Mid Stage 2 -  Partial thickness loss of dermis presenting as a shallow open injury with a red, pink wound bed without slough. pink (Active)  02/13/21 0200  Location: Sacrum  Location Orientation: Mid  Staging: Stage 2 -  Partial thickness loss of dermis presenting as a shallow open injury with a red, pink wound bed without slough.  Wound Description (Comments): pink  Present on Admission: Yes     Pressure Injury 02/13/21 Buttocks Right Stage 2 -  Partial thickness loss of dermis presenting as a shallow open injury with a red, pink wound bed without slough. pink (Active)  02/13/21 0200  Location: Buttocks  Location Orientation: Right  Staging: Stage 2 -  Partial thickness loss of dermis presenting as a shallow open injury with a red, pink wound bed without slough.  Wound Description (Comments): pink  Present on Admission: Yes    Consults: Pmt, dietitian  Subjective: Alert awake, not in distress voiding well per RN  Discharge Exam: Vitals:   04/27/21 2016 04/28/21 0440  BP: 119/65 101/71  Pulse: 84 98  Resp: 16 14   Temp: 99.9 F (37.7 C) 98.5 F (36.9 C)  SpO2: 99% 100%   General: Pt is alert, awake, not in acute distress Cardiovascular: RRR, S1/S2 +, no rubs, no gallops Respiratory: CTA bilaterally, no wheezing, no rhonchi Abdominal: Soft, NT, ND, bowel sounds + Extremities: no edema, no cyanosis  Discharge Instructions  Discharge Instructions     Diet - low sodium heart healthy   Complete by: As directed    Discharge instructions   Complete by: As directed    CBC and BMP in 1 week  Follow-up with PCP in a week  Please call call MD or return to ER for similar or worsening recurring problem that brought you to hospital or if any fever,nausea/vomiting,abdominal pain, uncontrolled pain, chest pain,  shortness of breath or any other alarming symptoms.  Please follow-up your doctor as instructed in a week time and call the office for appointment.  Please avoid alcohol, smoking, or any other illicit substance and maintain healthy habits including taking your regular medications as prescribed.  You were cared for by a hospitalist during your hospital stay. If you have any questions about your discharge medications or the care you received while you were in the hospital after you  are discharged, you can call the unit and ask to speak with the hospitalist on call if the hospitalist that took care of you is not available.  Once you are discharged, your primary care physician will handle any further medical issues. Please note that NO REFILLS for any discharge medications will be authorized once you are discharged, as it is imperative that you return to your primary care physician (or establish a relationship with a primary care physician if you do not have one) for your aftercare needs so that they can reassess your need for medications and monitor your lab values   Discharge wound care:   Complete by: As directed    Wound care to sacral Unstageable pressure injury: Cleanse with NS, apt dry. Fill  defect with saline moistened Kerlix roll gauze, top with ABD pad and secure with paper tape. Turn side to side to minimize time in the supine position. Wound care  Every shift   Increase activity slowly   Complete by: As directed       Allergies as of 04/28/2021   No Known Allergies      Medication List     STOP taking these medications    feeding supplement Liqd Replaced by: nutrition supplement (JUVEN) Pack       TAKE these medications    acetaminophen 650 MG CR tablet Commonly known as: TYLENOL Take 650-1,300 mg by mouth every 8 (eight) hours as needed (arthritis pain).   acetaminophen 500 MG tablet Commonly known as: TYLENOL Take 500 mg by mouth every 12 (twelve) hours. Knee pain   apixaban 5 MG Tabs tablet Commonly known as: ELIQUIS Take 1 tablet (5 mg total) by mouth 2 (two) times daily.   collagenase ointment Commonly known as: SANTYL Apply 1 application topically daily.   docusate sodium 100 MG capsule Commonly known as: COLACE Take 1 capsule (100 mg total) by mouth 2 (two) times daily as needed for mild constipation.   feeding supplement (PRO-STAT SUGAR FREE 64) Liqd Take 30 mLs by mouth 2 (two) times daily. With 4 oz. juice   ferrous sulfate 325 (65 FE) MG tablet Take 1 tablet (325 mg total) by mouth daily with breakfast.   magnesium oxide 400 (240 Mg) MG tablet Commonly known as: MAG-OX Take 1 tablet (400 mg total) by mouth 2 (two) times daily.   memantine 14 MG Cp24 24 hr capsule Commonly known as: NAMENDA XR Take 1 capsule (14 mg total) by mouth daily.   mirtazapine 7.5 MG tablet Commonly known as: REMERON Take 7.5 mg by mouth at bedtime.   multivitamin with minerals Tabs tablet Take 1 tablet by mouth daily.   nutrition supplement (JUVEN) Pack Take 1 packet by mouth 2 (two) times daily between meals. Replaces: feeding supplement Liqd   pantoprazole 40 MG tablet Commonly known as: PROTONIX Take 1 tablet (40 mg total) by mouth daily.    phosphorus 155-852-130 MG tablet Commonly known as: K PHOS NEUTRAL Take 2 tablets (500 mg total) by mouth 2 (two) times daily.   polyethylene glycol 17 g packet Commonly known as: MiraLax Take 17 g by mouth daily as needed for mild constipation or moderate constipation.   simvastatin 40 MG tablet Commonly known as: ZOCOR Take 1 tablet (40 mg total) by mouth at bedtime.   tamsulosin 0.4 MG Caps capsule Commonly known as: FLOMAX Take 1 capsule (0.4 mg total) by mouth daily after supper.   triamcinolone cream 0.5 % Commonly known as: KENALOG APPLY TOPICALLY 3  TIMES  DAILY   VITAMIN B-6 PO Take 100 mg by mouth daily.               Discharge Care Instructions  (From admission, onward)           Start     Ordered   04/28/21 0000  Discharge wound care:       Comments: Wound care to sacral Unstageable pressure injury: Cleanse with NS, apt dry. Fill defect with saline moistened Kerlix roll gauze, top with ABD pad and secure with paper tape. Turn side to side to minimize time in the supine position. Wound care  Every shift   04/28/21 0854            No Known Allergies  The results of significant diagnostics from this hospitalization (including imaging, microbiology, ancillary and laboratory) are listed below for reference.    Microbiology: Recent Results (from the past 240 hour(s))  Blood Culture (routine x 2)     Status: None (Preliminary result)   Collection Time: 04/25/21  4:18 PM   Specimen: BLOOD  Result Value Ref Range Status   Specimen Description   Final    BLOOD BLOOD RIGHT FOREARM Performed at [facility] [address] [place], [place] [address]    Special Requests   Final    BOTTLES DRAWN AEROBIC AND ANAEROBIC Blood Culture results may not be optimal due to an inadequate volume of blood received in culture bottles Performed at [facility] [address], [place], [place] [address]    Culture   Final     NO GROWTH 3 DAYS Performed at [facility], [place] [address] [place], [place] [address]    Report Status PENDING  Incomplete  Blood Culture (routine x 2)     Status: None (Preliminary result)   Collection Time: 04/25/21  4:18 PM   Specimen: BLOOD  Result Value Ref Range Status   Specimen Description   Final    BLOOD RIGHT ANTECUBITAL Performed at [facility] [address] [place], [place] [address]    Special Requests   Final    BOTTLES DRAWN AEROBIC AND ANAEROBIC Blood Culture results may not be optimal due to an inadequate volume of blood received in culture bottles Performed at [facility] [address], [place], [place] [address]    Culture   Final    NO GROWTH 3 DAYS Performed at [facility], [place] [address], [place], [place] [address]    Report Status PENDING  Incomplete  Resp Panel by RT-PCR (Flu A&B, Covid) Nasopharyngeal Swab     Status: None   Collection Time: 04/25/21  8:38 PM   Specimen: Nasopharyngeal Swab; Nasopharyngeal(NP) swabs in vial transport medium  Result Value Ref Range Status   SARS Coronavirus 2 by RT PCR NEGATIVE NEGATIVE Final    Comment: (NOTE) SARS-CoV-2 target nucleic acids are NOT DETECTED.  The SARS-CoV-2 RNA is generally detectable in upper respiratory specimens during the acute phase of infection. The lowest concentration of SARS-CoV-2 viral copies this assay can detect is 138 copies/mL. A negative result does not preclude SARS-Cov-2 infection and should not be used as the sole basis for treatment or other patient management decisions. A negative result may occur with  improper specimen collection/handling, submission of specimen other than nasopharyngeal swab, presence of viral mutation(s) within the areas targeted by this assay, and inadequate number of viral copies(<138 copies/mL). A negative result must  be combined with clinical observations, patient history, and  epidemiological information. The expected result is Negative.  Fact Sheet for Patients:  EntrepreneurPulse.com.au  Fact Sheet for Healthcare Providers:  IncredibleEmployment.be  This test is no t yet approved or cleared by the [place] FDA and  has been authorized for detection and/or diagnosis of SARS-CoV-2 by FDA under an Emergency Use Authorization (EUA). This EUA will remain  in effect (meaning this test can be used) for the duration of the COVID-19 declaration under Section 564(b)(1) of the Act, 21 U.S.C.section 360bbb-3(b)(1), unless the authorization is terminated  or revoked sooner.       Influenza A by PCR NEGATIVE NEGATIVE Final   Influenza B by PCR NEGATIVE NEGATIVE Final    Comment: (NOTE) The Xpert Xpress SARS-CoV-2/FLU/RSV plus assay is intended as an aid in the diagnosis of influenza from Nasopharyngeal swab specimens and should not be used as a sole basis for treatment. Nasal washings and aspirates are unacceptable for Xpert Xpress SARS-CoV-2/FLU/RSV testing.  Fact Sheet for Patients: EntrepreneurPulse.com.au  Fact Sheet for Healthcare Providers: IncredibleEmployment.be  This test is not yet approved or cleared by the [place] FDA and has been authorized for detection and/or diagnosis of SARS-CoV-2 by FDA under an Emergency Use Authorization (EUA). This EUA will remain in effect (meaning this test can be used) for the duration of the COVID-19 declaration under Section 564(b)(1) of the Act, 21 U.S.C. section 360bbb-3(b)(1), unless the authorization is terminated or revoked.  Performed at [facility], [place] [address] [place], [place] [address]     Procedures/Studies: CT ABDOMEN PELVIS W CONTRAST  Result Date: 04/25/2021 CLINICAL DATA:  Abdominal pain with history of dementia. Unable to provide history. EXAM: CT ABDOMEN AND PELVIS WITH CONTRAST TECHNIQUE:  Multidetector CT imaging of the abdomen and pelvis was performed using the standard protocol following bolus administration of intravenous contrast. RADIATION DOSE REDUCTION: This exam was performed according to the departmental dose-optimization program which includes automated exposure control, adjustment of the mA and/or kV according to patient size and/or use of iterative reconstruction technique. CONTRAST:  51mL OMNIPAQUE IOHEXOL 300 MG/ML  SOLN COMPARISON:  CTA chest, abdomen and pelvis 02/12/2021 FINDINGS: Factors affecting image quality: Beam hardening from the patient's arms in the field and lower extremity flexion contractures. Lower chest: Calcified granuloma again noted in the left lower lobe. There are new trace pleural effusions compared to the prior study. Cardiomegaly is again noted but decreased in the interval. Small pericardial effusion is also new, measuring up to 9 mm. Calcifications circumflex coronary artery. Lung bases are clear of infiltrates. There is bronchial thickening in the lower lobes consistent with bronchitis and/or small airways disease. Hepatobiliary: The liver is unremarkable. There are small stones layering in the gallbladder with mild gallbladder distention. There is mild intrahepatic periportal edema, most typically due to fluid overload. There is no biliary dilatation. Pancreas: Partially atrophic and otherwise unremarkable. Spleen: No mass enhancement or splenomegaly. Adrenals/Urinary Tract: There are left renal cysts up to 3 cm. There are few additional left renal cortical subcentimeter hypodensities which are too small to characterize. There is a 1.1 cm indeterminate heterogeneous lesion of the lower lateral right kidney which could be a complex cyst or a mass (series 3 axial 43). There are no stones or hydronephrosis. There is no adrenal mass. Stomach/Bowel: No dilatation or wall thickening of the small bowel and stomach. An appendix is not seen in this patient. There is  moderate retained stool in the transverse colon,  moderate impacted stool increased in the rectum with scattered rectal wall pneumatosis. There is perirectal fluid and edema which could be inflammatory or congestive. Vascular/Lymphatic: There is mild-to-moderate aortoiliac atherosclerosis without AAA. No adenopathy is visible. Reproductive: There are calcified fibroids in the uterus, which is positioned to the left. No other adnexal abnormality is visible. There are numerous pelvic phleboliths. Other: There is interval increased body wall anasarca throughout, increased mesenteric edema throughout. There is mild scattered ascites in the mesenteric folds and posterior deep pelvis.There is a small umbilical fat hernia. Musculoskeletal: A sacral decubitus ulcer has developed since the prior study and measures 6.2 cm in height, 2.8 cm in depth and 5.1 cm transverse, located right of the midline with extension down to bone. No destructive bone lesion is seen. There is osteopenia with degenerative changes of the spine, advanced L4-5 facet hypertrophy and disc collapse and grade 1, borderline grade 2 L4-5 spondylolisthesis with spinal canal and severe vertical foraminal stenosis. No aggressive bone lesion is seen. IMPRESSION: 1. Constipation is again noted without evidence of small-bowel obstruction, but there is increased rectal fecal impaction, with scattered rectal wall pneumatosis which could be due to wall ischemia or stercoral proctitis. 2. Increased body wall and mesenteric edema, small amount of ascites in the abdomen and pelvis. This could be congestive and/or due to malnutrition/hepatic dysfunction. 3. Increased intrahepatic periportal edema, most likely related to fluid overload or congestive, alternatively could be hepatitis. 4. Cholelithiasis and gallbladder distention without wall thickening or biliary dilatation. 5. Left renal cysts, and a 1.1 cm heterogeneous lesion of the lower lateral right kidney which  could be a complex cyst or solid mass. Ultrasound or MRI follow-up recommended. 6. Trace pleural effusions new from prior study, with small new pericardial effusion. Cardiomegaly. 7. Right paramidline sacral decubitus ulcer extending down to bone, also new from 96/22/2979. 8. Umbilical fat hernia. 9. Bilateral lower lobe bronchitis and/or small airways disease. Electronically Signed   By: Telford Nab M.D.   On: 04/25/2021 22:04   DG Chest Port 1 View  Result Date: 04/25/2021 CLINICAL DATA:  Questionable sepsis - evaluate for abnormality EXAM: PORTABLE CHEST 1 VIEW COMPARISON:  Chest x-ray 02/12/2021 FINDINGS: The right heart border is not visualized. Otherwise the heart and mediastinal contours are unchanged. The right heart border aortic calcification. Bilateral apices not well visualized due to overlying mandible. No definite focal consolidation. No pulmonary edema. No pleural effusion. No pneumothorax. No acute osseous abnormality. IMPRESSION: The right heart border is not visualized. This may be due to right middle lobe disease versus patient positioning. Limited evaluation: Bilateral apices not well visualized due to overlying mandible. Recommend repeat PA and lateral view of the chest for further evaluation. Electronically Signed   By: Iven Finn M.D.   On: 04/25/2021 16:11    Labs: BNP (last 3 results) Recent Labs    02/12/21 1556  BNP 892.1*   Basic Metabolic Panel: Recent Labs  Lab 04/25/21 1618 04/26/21 0145 04/26/21 1316 04/27/21 0552 04/27/21 1454 04/28/21 0542  NA 142 137 138 137  --  134*  K 2.5* 3.8 2.9* 2.8* 4.1 4.3  CL 100 101 101 105  --  106  CO2 33* 29 28 28   --  24  GLUCOSE 101* 82 82 92  --  78  BUN 11 9 8 9   --  10  CREATININE <0.30* 0.36* 0.49 0.36*  --  0.50  CALCIUM 8.9 8.5* 8.7* 8.3*  --  8.3*  MG 1.9  --  1.7 2.0  --   --   PHOS  --  2.9 2.5  --   --   --    Liver Function Tests: Recent Labs  Lab 04/25/21 1618 04/26/21 0145 04/26/21 1316  04/27/21 0552  AST 24 38 20 21  ALT 20 17 17 15   ALKPHOS 88 84 84 64  BILITOT 0.6 1.5* 1.0 0.4  PROT 7.4 6.3* 6.8 5.9*  ALBUMIN 2.7* 2.4* 2.4* 2.1*   No results for input(s): LIPASE, AMYLASE in the last 168 hours. No results for input(s): AMMONIA in the last 168 hours. CBC: Recent Labs  Lab 04/25/21 1618 04/26/21 1316 04/27/21 0552 04/28/21 0542  WBC 9.0 10.0 10.2 8.2  NEUTROABS 6.3 7.9*  --   --   HGB 12.0 11.0* 9.2* 8.9*  HCT 37.7 34.5* 29.4* 28.6*  MCV 96.7 97.2 98.0 97.6  PLT 329 307 302 293   Cardiac Enzymes: Recent Labs  Lab 04/26/21 0145  CKTOTAL 112   BNP: Invalid input(s): POCBNP CBG: No results for input(s): GLUCAP in the last 168 hours. D-Dimer No results for input(s): DDIMER in the last 72 hours. Hgb A1c No results for input(s): HGBA1C in the last 72 hours. Lipid Profile No results for input(s): CHOL, HDL, LDLCALC, TRIG, CHOLHDL, LDLDIRECT in the last 72 hours. Thyroid function studies Recent Labs    04/26/21 1316  TSH 2.344   Anemia work up No results for input(s): VITAMINB12, FOLATE, FERRITIN, TIBC, IRON, RETICCTPCT in the last 72 hours. Urinalysis    Component Value Date/Time   COLORURINE AMBER (A) 04/25/2021 1524   APPEARANCEUR CLEAR 04/25/2021 1524   LABSPEC 1.024 04/25/2021 1524   LABSPEC 1.030 09/24/2018 1253   PHURINE 7.0 04/25/2021 1524   GLUCOSEU NEGATIVE 04/25/2021 1524   HGBUR NEGATIVE 04/25/2021 1524   BILIRUBINUR NEGATIVE 04/25/2021 1524   BILIRUBINUR negative 09/24/2018 1253   KETONESUR NEGATIVE 04/25/2021 1524   PROTEINUR 30 (A) 04/25/2021 1524   NITRITE NEGATIVE 04/25/2021 1524   LEUKOCYTESUR NEGATIVE 04/25/2021 1524   Sepsis Labs Invalid input(s): PROCALCITONIN,  WBC,  LACTICIDVEN Microbiology Recent Results (from the past 240 hour(s))  Blood Culture (routine x 2)     Status: None (Preliminary result)   Collection Time: 04/25/21  4:18 PM   Specimen: BLOOD  Result Value Ref Range Status   Specimen Description    Final    BLOOD BLOOD RIGHT FOREARM Performed at [facility], [place] [address], [place], [place] [address]    Special Requests   Final    BOTTLES DRAWN AEROBIC AND ANAEROBIC Blood Culture results may not be optimal due to an inadequate volume of blood received in culture bottles Performed at [facility] [address], [place], [place] [address]    Culture   Final    NO GROWTH 3 DAYS Performed at [facility], [place] [address] [place], [place] [address]    Report Status PENDING  Incomplete  Blood Culture (routine x 2)     Status: None (Preliminary result)   Collection Time: 04/25/21  4:18 PM   Specimen: BLOOD  Result Value Ref Range Status   Specimen Description   Final    BLOOD RIGHT ANTECUBITAL Performed at [facility] [address], [place], [place] [address]    Special Requests   Final    BOTTLES DRAWN AEROBIC AND ANAEROBIC Blood Culture results may not be optimal due to an inadequate volume of blood received in culture bottles Performed at [facility]  [place] [address], [place], [place] [address]    Culture   Final    NO GROWTH 3 DAYS Performed at [facility], [place] [address], [place], [place] [address]    Report Status PENDING  Incomplete  Resp Panel by RT-PCR (Flu A&B, Covid) Nasopharyngeal Swab     Status: None   Collection Time: 04/25/21  8:38 PM   Specimen: Nasopharyngeal Swab; Nasopharyngeal(NP) swabs in vial transport medium  Result Value Ref Range Status   SARS Coronavirus 2 by RT PCR NEGATIVE NEGATIVE Final    Comment: (NOTE) SARS-CoV-2 target nucleic acids are NOT DETECTED.  The SARS-CoV-2 RNA is generally detectable in upper respiratory specimens during the acute phase of infection. The lowest concentration of SARS-CoV-2 viral copies this assay can detect is 138 copies/mL. A negative result does not preclude SARS-Cov-2 infection and should not be used as the sole basis  for treatment or other patient management decisions. A negative result may occur with  improper specimen collection/handling, submission of specimen other than nasopharyngeal swab, presence of viral mutation(s) within the areas targeted by this assay, and inadequate number of viral copies(<138 copies/mL). A negative result must be combined with clinical observations, patient history, and epidemiological information. The expected result is Negative.  Fact Sheet for Patients:  EntrepreneurPulse.com.au  Fact Sheet for Healthcare Providers:  IncredibleEmployment.be  This test is no t yet approved or cleared by the [place] FDA and  has been authorized for detection and/or diagnosis of SARS-CoV-2 by FDA under an Emergency Use Authorization (EUA). This EUA will remain  in effect (meaning this test can be used) for the duration of the COVID-19 declaration under Section 564(b)(1) of the Act, 21 U.S.C.section 360bbb-3(b)(1), unless the authorization is terminated  or revoked sooner.       Influenza A by PCR NEGATIVE NEGATIVE Final   Influenza B by PCR NEGATIVE NEGATIVE Final    Comment: (NOTE) The Xpert Xpress SARS-CoV-2/FLU/RSV plus assay is intended as an aid in the diagnosis of influenza from Nasopharyngeal swab specimens and should not be used as a sole basis for treatment. Nasal washings and aspirates are unacceptable for Xpert Xpress SARS-CoV-2/FLU/RSV testing.  Fact Sheet for Patients: EntrepreneurPulse.com.au  Fact Sheet for Healthcare Providers: IncredibleEmployment.be  This test is not yet approved or cleared by the [place] FDA and has been authorized for detection and/or diagnosis of SARS-CoV-2 by FDA under an Emergency Use Authorization (EUA). This EUA will remain in effect (meaning this test can be used) for the duration of the COVID-19 declaration under Section 564(b)(1) of the Act, 21  U.S.C. section 360bbb-3(b)(1), unless the authorization is terminated or revoked.  Performed at [facility] [place] [address], [place], [place] [address]      Time coordinating discharge: 25 minutes  SIGNED: Antonieta Pert, MD  [facility] 04/28/2021, 8:56 AM  If 7PM-7AM, please contact night-coverage [email or web address]

## 2021-04-27 NOTE — Progress Notes (Signed)
Initial Nutrition Assessment  DOCUMENTATION CODES:   Severe malnutrition in context of chronic illness  INTERVENTION:   -Ensure Plus High Protein po TID, each supplement provides 350 kcal and 20 grams of protein.   -1 packet Juven BID, each packet provides 95 calories, 2.5 grams of protein (collagen), and 9.8 grams of carbohydrate (3 grams sugar); also contains 7 grams of L-arginine and L-glutamine, 300 mg vitamin C, 15 mg vitamin E, 1.2 mcg vitamin B-12, 9.5 mg zinc, 200 mg calcium, and 1.5 g  Calcium Beta-hydroxy-Beta-methylbutyrate to support wound healing   -Multivitamin with minerals daily  -Added finger foods to diet order per SLP recommendation  NUTRITION DIAGNOSIS:   Severe Malnutrition related to chronic illness (Alzheimer's dementia) as evidenced by moderate fat depletion, severe muscle depletion, energy intake < or equal to 75% for > or equal to 1 month.  GOAL:   Patient will meet greater than or equal to 90% of their needs  MONITOR:   PO intake, Supplement acceptance, Labs, Weight trends, I & O's, Skin  REASON FOR ASSESSMENT:   Consult Wound healing, Assessment of nutrition requirement/status  ASSESSMENT:   88 year old female with medical history of dementia, massive PE on Eliquis, hypertension, paroxysmal atrial fibrillation presented with worsening decubitus ulcer.  Patient has history of advanced dementia, came from skilled nursing facility for sacral wound.  She has been there for past few months.  Wound care physician told her to go to the ER for surgical debridement.  Patient has been bedbound since December 2022.  Patient in room, disoriented x 4. Pt with Alzheimer's dementia. Could not tell ambassador delivering her food tray her birthday or name.  Pt was recommended finger foods per SLP evaluation on 2/21. Added this to diet order.   Per RN at bedside, pt ate 4 bites of breakfast this morning. Has not received an Ensure yet today, will after lunch.   Pt  contracted in [place], bedbound.  Per weight records, no weight recorded for this admission. Likely d/t functional quadriplegia. Last recorded weight 128 lbs o from 02/23/21.  Medications: Ferrous sulfate, Remeron, KLOR-CON, IV KCl  Labs reviewed:  Low K  NUTRITION - FOCUSED PHYSICAL EXAM:  Flowsheet Row Most Recent Value  Orbital Region Severe depletion  Upper Arm Region Moderate depletion  Thoracic and Lumbar Region Moderate depletion  Buccal Region Moderate depletion  Temple Region Moderate depletion  Clavicle Bone Region Severe depletion  Clavicle and Acromion Bone Region Severe depletion  Scapular Bone Region Severe depletion  Dorsal Hand Mild depletion  Patellar Region Unable to assess  [contracted]  Anterior Thigh Region Unable to assess  Posterior Calf Region Unable to assess  [contracted]  Edema (RD Assessment) None  Hair Reviewed  Eyes Reviewed  Mouth Reviewed  [missing teeth]  Skin Reviewed       Diet Order:   Diet Order             Diet Heart Room service appropriate? Yes; Fluid consistency: Thin  Diet effective now                   EDUCATION NEEDS:   No education needs have been identified at this time  Skin:  Skin Assessment: Skin Integrity Issues: Skin Integrity Issues:: Unstageable, Stage III Stage III: right knee Unstageable: sacral wound  Last BM:  2/21 -type 5  Height:   Ht Readings from Last 1 Encounters:  10/12/20 4\' 11"  (1.499 m)    Weight:   Wt Readings from Last 1  Encounters:  02/23/21 58.6 kg    BMI:  There is no height or weight on file to calculate BMI.  Estimated Nutritional Needs:   Kcal:  1400-1600  Protein:  70-85g  Fluid:  1.6L/day   Clayton Bibles, MS, RD, LDN Inpatient Clinical Dietitian Contact information available via Amion

## 2021-04-27 NOTE — Hospital Course (Addendum)
88 year old female with medical history of dementia, massive PE on Eliquis, chronic diastolic CHF, hypertension, paroxysmal atrial fibrillation, dementia, presented from skilled nursing facility for sacral wound.  Seen by wound care recently and was told to go to the ER for surgical debridement, she has been bedbound since December 2022 has been there for past few months.  Wound care physician told her to go to the ER for surgical debridement.  Patient has been bedbound since December 2022. Seen in the ED underwent CT abdomen pelvis General surgery was consulted they feel likely with scar overlying the bone recommended wound care and no surgical debridement advised.  Wound care has seen the patient, diagnosed with unstageable pressure injury to sacrum and stage III as needed to right immediately, being managed with topical wound care nutritional support, turning and repositioning.  Palliative was consulted to discuss further goals of care given patient's debilitated state old age and and full code on admission.  Patient having significant constipation with stage coral proctitis or wall ischemia discussed with GI advised MiraLAX and enema, had BMX2.

## 2021-04-27 NOTE — Assessment & Plan Note (Addendum)
Hemoglobin is slightly downtrending no active bleeding noted.  We will advise to check CBC in 5 to 7 days at the facility given that she is on Eliquis. Recent Labs  Lab 04/25/21 1618 04/26/21 1316 04/27/21 0552 04/28/21 0542  HGB 12.0 11.0* 9.2* 8.9*  HCT 37.7 34.5* 29.4* 28.6*

## 2021-04-27 NOTE — Assessment & Plan Note (Addendum)
Given patient multiple comorbidities, she will benefit with palliative care evaluation and seen by palliative care at this time she is DNR advised follow-up with palliative care at the facility.

## 2021-04-27 NOTE — Assessment & Plan Note (Signed)
Seen by surgery advised topical treatment, wound care on board continue current wound care topical management, repositioning/frequent changing.  Augment nutritional status.

## 2021-04-28 LAB — BASIC METABOLIC PANEL
Anion gap: 4 — ABNORMAL LOW (ref 5–15)
BUN: 10 mg/dL (ref 8–23)
CO2: 24 mmol/L (ref 22–32)
Calcium: 8.3 mg/dL — ABNORMAL LOW (ref 8.9–10.3)
Chloride: 106 mmol/L (ref 98–111)
Creatinine, Ser: 0.5 mg/dL (ref 0.44–1.00)
GFR, Estimated: >60 mL/min (ref >60)
Glucose, Bld: 78 mg/dL (ref 70–99)
Potassium: 4.3 mmol/L (ref 3.5–5.1)
Sodium: 134 mmol/L — ABNORMAL LOW (ref 135–145)

## 2021-04-28 LAB — CBC
HCT: 28.6 % — ABNORMAL LOW (ref 36.0–46.0)
Hemoglobin: 8.9 g/dL — ABNORMAL LOW (ref 12.0–15.0)
MCH: 30.4 pg (ref 26.0–34.0)
MCHC: 31.1 g/dL (ref 30.0–36.0)
MCV: 97.6 fL (ref 80.0–100.0)
Platelets: 293 10*3/uL (ref 150–400)
RBC: 2.93 MIL/uL — ABNORMAL LOW (ref 3.87–5.11)
RDW: 13.5 % (ref 11.5–15.5)
WBC: 8.2 10*3/uL (ref 4.0–10.5)
nRBC: 0 % (ref 0.0–0.2)

## 2021-04-28 MED ORDER — JUVEN PO PACK: 1.0000 | PACK | Freq: Two times a day (BID) | ORAL | 0 refills | Status: AC

## 2021-04-28 MED ORDER — POLYETHYLENE GLYCOL 3350 17 G PO PACK
17.0000 g | PACK | Freq: Every day | ORAL | 0 refills | Status: AC | PRN
Start: 2021-04-28 — End: ?

## 2021-04-28 MED ORDER — TAMSULOSIN HCL 0.4 MG PO CAPS: 0.4000 mg | ORAL_CAPSULE | Freq: Every day | ORAL | Status: AC

## 2021-04-28 MED ORDER — PANTOPRAZOLE SODIUM 40 MG PO TBEC
40.0000 mg | DELAYED_RELEASE_TABLET | Freq: Every day | ORAL | Status: DC
Start: 2021-04-28 — End: 2021-04-29
  Administered 2021-04-28: 40 mg via ORAL
  Filled 2021-04-28: qty 1

## 2021-04-28 MED ORDER — LIP MEDEX EX OINT
1.0000 | TOPICAL_OINTMENT | CUTANEOUS | Status: DC | PRN
Start: 2021-04-28 — End: 2021-04-29
  Filled 2021-04-28: qty 7

## 2021-04-28 NOTE — Progress Notes (Signed)
Report called and given to [place] LPN at [facility]. Report received.

## 2021-04-28 NOTE — TOC Transition Note (Signed)
Transition of Care [facility]) - CM/SW Discharge Note   Patient Details  Name: [name] [name] MRN: [id number] Date of Birth: 08-Jun-1931  Transition of Care [facility] CM/SW Contact:  Lynnell Catalan, RN Phone Number: 04/28/2021, 2:19 PM   Clinical Narrative:     Pt to dc back to [facility] today. She will transport via [place]. Yellow DNR is on the chart for transport. She will go to room 126. RN to call report to [phone]  Claiborne Billings from [facility] approved pt to come back with insurance auth pending.   Barriers to Discharge: Continued Medical Work up  Discharge Plan and Services   Discharge Planning Services: CM Consult              Readmission Risk Interventions No flowsheet data found.

## 2021-04-28 NOTE — Consult Note (Signed)
[facility] Inpatient Consult   04/28/2021  [name] [name] 09-14-1931 [id number]  [place] Management [facility] CM)   Patient chart reviewed with noted high risk score for unplanned readmission. Assessed for post hospital [facility] CM needs. Per review, plan is for SNF level of care. No [facility]N CM needs at this time.  Of note, [facility] Care Management services does not replace or interfere with any services that are arranged by inpatient case management or social work.   Netta Cedars, MSN, RN [facility] [employer] [phone]  Toll free office [phone]

## 2021-04-28 NOTE — NC FL2 (Signed)
[place] MEDICAID FL2 LEVEL OF CARE SCREENING TOOL     IDENTIFICATION  Patient Name: [name] [name] Birthdate: 1931-09-22 Sex: female Admission Date (Current Location): 04/25/2021  [facility] [place] Number:  Herbalist and Address:  [facility],  [place], [place]      Provider Number: [id number]  Attending Physician Name and Address:  Antonieta Pert, MD  Relative Name and Phone Number:       Current Level of Care: Hospital Recommended Level of Care: [place] Prior Approval Number:    Date Approved/Denied:   PASRR Number:    Discharge Plan: SNF    Current Diagnoses: Patient Active Problem List   Diagnosis Date Noted   Constipation 04/27/2021   Cholelithiasis 04/27/2021   Decubitus ulcer of right knee, stage 3 ([place]) 04/27/2021   Anemia of chronic disease 04/27/2021   Goals of care, counseling/discussion 04/27/2021   Decubitus ulcer of sacral region, unstageable ([place]) 04/25/2021   Hypokalemia 04/25/2021   Functional quadriplegia ([place]) 04/25/2021   Paroxysmal atrial fibrillation ([place]) 04/25/2021   Protein-calorie malnutrition, severe 02/14/2021   Pressure injury of skin 02/13/2021   Pulmonary embolism ([place]) 02/12/2021   Shock ([place])    AKI (acute kidney injury) ([place])    History of cataract extraction 10/12/2020   Gastroesophageal reflux disease 10/12/2020   Allergic rhinitis 10/12/2020   Alzheimer's disease ([place]) 10/12/2020   Mild obstructive sleep apnea-hypopnea syndrome 12/31/2018   Episodes of formed visual hallucinations 12/31/2018   OAB (overactive bladder) 09/21/2016   Atherosclerotic PVD with intermittent claudication ([place]) 03/21/2012   Hyperlipidemia 06/28/2011   Essential hypertension, benign 06/28/2011   Osteoarthritis 06/28/2011   Cardiomegaly 02/07/2011    Orientation RESPIRATION BLADDER Height & Weight     Self  Normal Incontinent Weight:   Height:     BEHAVIORAL SYMPTOMS/MOOD NEUROLOGICAL  BOWEL NUTRITION STATUS      Incontinent Diet  AMBULATORY STATUS COMMUNICATION OF NEEDS Skin   Extensive Assist Verbally PU Stage and Appropriate Care                       Personal Care Assistance Level of Assistance  Bathing, Feeding, Dressing Bathing Assistance: Maximum assistance Feeding assistance: Maximum assistance Dressing Assistance: Maximum assistance     Functional Limitations Info  Sight, Hearing, Speech Sight Info: Impaired Hearing Info: Impaired Speech Info: Adequate    SPECIAL CARE FACTORS FREQUENCY  PT (By licensed PT), OT (By licensed OT)     PT Frequency: PRN OT Frequency: PRN            Contractures      Additional Factors Info  Code Status, Allergies Code Status Info: DNR Allergies Info: None           Current Medications (04/28/2021):  This is the current hospital active medication list Current Facility-Administered Medications  Medication Dose Route Frequency Provider Last Rate Last Admin   acetaminophen (TYLENOL) tablet 650 mg  650 mg Oral Q6H PRN Toy Baker, MD   650 mg at 04/27/21 2123   Or   acetaminophen (TYLENOL) suppository 650 mg  650 mg Rectal Q6H PRN Toy Baker, MD       apixaban (ELIQUIS) tablet 5 mg  5 mg Oral BID Darrick Meigs, Gagan S, MD   5 mg at 04/28/21 1036   feeding supplement (ENSURE ENLIVE / ENSURE PLUS) liquid 237 mL  237 mL Oral TID BM Doutova, Anastassia, MD   237 mL at 04/28/21 1036  ferrous sulfate tablet 325 mg  325 mg Oral Q breakfast Doutova, Anastassia, MD   325 mg at 04/28/21 1594   HYDROcodone-acetaminophen (NORCO/VICODIN) 5-325 MG per tablet 1-2 tablet  1-2 tablet Oral Q4H PRN Toy Baker, MD   2 tablet at 04/27/21 1711   lip balm (CARMEX) ointment 1 application  1 application Topical PRN Kc, Ramesh, MD       memantine (NAMENDA XR) 24 hr capsule 14 mg  14 mg Oral Daily Doutova, Anastassia, MD   14 mg at 04/28/21 1037   mirtazapine (REMERON) tablet 7.5 mg  7.5 mg Oral QHS Doutova,  Anastassia, MD   7.5 mg at 04/27/21 2124   multivitamin with minerals tablet 1 tablet  1 tablet Oral Daily Kc, Maren [place], MD   1 tablet at 04/28/21 1035   nutrition supplement (JUVEN) (JUVEN) powder packet 1 packet  1 packet Oral BID BM Kc, Ramesh, MD   1 packet at 04/28/21 1037   pantoprazole (PROTONIX) EC tablet 40 mg  40 mg Oral Daily Leodis Sias T, RPH   40 mg at 04/28/21 1036   potassium chloride SA (KLOR-CON M) CR tablet 20 mEq  20 mEq Oral BID Kc, Ramesh, MD   20 mEq at 04/28/21 1036   simvastatin (ZOCOR) tablet 40 mg  40 mg Oral QHS Doutova, Nyoka Lint, MD   40 mg at 04/27/21 2123   sodium hypochlorite (DAKIN'S 1/4 STRENGTH) topical solution   Topical BID Oswald Hillock, MD   Given at 04/28/21 1137   tamsulosin (FLOMAX) capsule 0.4 mg  0.4 mg Oral QPC supper Kc, Ramesh, MD   0.4 mg at 04/27/21 2000   traMADol (ULTRAM) tablet 50 mg  50 mg Oral Q6H PRN Antonieta Pert, MD   50 mg at 04/27/21 1142     Discharge Medications: Please see discharge summary for a list of discharge medications.  Relevant Imaging Results:  Relevant Lab Results:   Additional Information SSN [id number]  WOC Nurse Consult Note:  Reason for Consult: Unstageable pressure injury to sacrum. Also Stage 3 PI to right medial knee. Seen by Surgery (Drs. White and [employer]). They have signed off in favor of topical wound care.  Wound type: Pressure, moisture  Pressure Injury POA: Yes  Measurement: 7cm x 4cm x 4cm with 3cm undermining at 12 o'clock  Right medial knee:  4cm x 3cm x 0.2cm Stage 3  Wound bed: black, soft, malodorous  Drainage (amount, consistency, odor) small amount dark drainage  Periwound: intact, macerated from fecal incontinence  Dressing procedure/placement/frequency: Mattress replacement has been ordered, also Prevalon boots.  Topical care will be with Dakin's solution twice daily dressings x 3 days, then NS dressings twice daily.  Turning and repositioning will be the cornerstone of the POC. Consider Nutritional  Consult.  Lashaunta Sicard, Marjie Skiff, RN

## 2021-04-29 DIAGNOSIS — I2609 Other pulmonary embolism with acute cor pulmonale: Secondary | ICD-10-CM | POA: Diagnosis not present

## 2021-04-29 DIAGNOSIS — Z1159 Encounter for screening for other viral diseases: Secondary | ICD-10-CM | POA: Diagnosis not present

## 2021-04-29 DIAGNOSIS — M6281 Muscle weakness (generalized): Secondary | ICD-10-CM | POA: Diagnosis not present

## 2021-04-30 LAB — CULTURE, BLOOD (ROUTINE X 2)
Culture: NO GROWTH
Culture: NO GROWTH

## 2021-05-02 DIAGNOSIS — Z66 Do not resuscitate: Secondary | ICD-10-CM | POA: Diagnosis not present

## 2021-05-02 DIAGNOSIS — Z1159 Encounter for screening for other viral diseases: Secondary | ICD-10-CM | POA: Diagnosis not present

## 2021-05-02 DIAGNOSIS — R630 Anorexia: Secondary | ICD-10-CM | POA: Diagnosis not present

## 2021-05-02 DIAGNOSIS — R627 Adult failure to thrive: Secondary | ICD-10-CM | POA: Diagnosis not present

## 2021-05-02 DIAGNOSIS — M6281 Muscle weakness (generalized): Secondary | ICD-10-CM | POA: Diagnosis not present

## 2021-05-02 DIAGNOSIS — F0390 Unspecified dementia without behavioral disturbance: Secondary | ICD-10-CM | POA: Diagnosis not present

## 2021-05-02 DIAGNOSIS — I2609 Other pulmonary embolism with acute cor pulmonale: Secondary | ICD-10-CM | POA: Diagnosis not present

## 2021-05-02 DIAGNOSIS — E43 Unspecified severe protein-calorie malnutrition: Secondary | ICD-10-CM | POA: Diagnosis not present

## 2021-05-03 DIAGNOSIS — E43 Unspecified severe protein-calorie malnutrition: Secondary | ICD-10-CM | POA: Diagnosis not present

## 2021-05-03 DIAGNOSIS — L89154 Pressure ulcer of sacral region, stage 4: Secondary | ICD-10-CM | POA: Diagnosis not present

## 2021-05-03 DIAGNOSIS — L89610 Pressure ulcer of right heel, unstageable: Secondary | ICD-10-CM | POA: Diagnosis not present

## 2021-05-03 DIAGNOSIS — I2609 Other pulmonary embolism with acute cor pulmonale: Secondary | ICD-10-CM | POA: Diagnosis not present

## 2021-05-03 DIAGNOSIS — I4891 Unspecified atrial fibrillation: Secondary | ICD-10-CM | POA: Diagnosis not present

## 2021-05-03 DIAGNOSIS — L89894 Pressure ulcer of other site, stage 4: Secondary | ICD-10-CM | POA: Diagnosis not present

## 2021-05-03 DIAGNOSIS — M6281 Muscle weakness (generalized): Secondary | ICD-10-CM | POA: Diagnosis not present

## 2021-05-03 DIAGNOSIS — L89159 Pressure ulcer of sacral region, unspecified stage: Secondary | ICD-10-CM | POA: Diagnosis not present

## 2021-05-03 DIAGNOSIS — Z1159 Encounter for screening for other viral diseases: Secondary | ICD-10-CM | POA: Diagnosis not present

## 2021-05-04 DIAGNOSIS — E43 Unspecified severe protein-calorie malnutrition: Secondary | ICD-10-CM | POA: Diagnosis not present

## 2021-05-04 DIAGNOSIS — I2510 Atherosclerotic heart disease of native coronary artery without angina pectoris: Secondary | ICD-10-CM | POA: Diagnosis not present

## 2021-05-04 DIAGNOSIS — D638 Anemia in other chronic diseases classified elsewhere: Secondary | ICD-10-CM | POA: Diagnosis not present

## 2021-05-04 DIAGNOSIS — F0390 Unspecified dementia without behavioral disturbance: Secondary | ICD-10-CM | POA: Diagnosis not present

## 2021-05-04 DIAGNOSIS — D649 Anemia, unspecified: Secondary | ICD-10-CM | POA: Diagnosis not present

## 2021-05-04 DIAGNOSIS — Z1159 Encounter for screening for other viral diseases: Secondary | ICD-10-CM | POA: Diagnosis not present

## 2021-05-04 DIAGNOSIS — M6281 Muscle weakness (generalized): Secondary | ICD-10-CM | POA: Diagnosis not present

## 2021-05-04 DIAGNOSIS — I2609 Other pulmonary embolism with acute cor pulmonale: Secondary | ICD-10-CM | POA: Diagnosis not present

## 2021-05-04 DIAGNOSIS — K219 Gastro-esophageal reflux disease without esophagitis: Secondary | ICD-10-CM | POA: Diagnosis not present

## 2021-05-04 DIAGNOSIS — G9341 Metabolic encephalopathy: Secondary | ICD-10-CM | POA: Diagnosis not present

## 2021-05-04 DIAGNOSIS — E785 Hyperlipidemia, unspecified: Secondary | ICD-10-CM | POA: Diagnosis not present

## 2021-05-04 DIAGNOSIS — L89150 Pressure ulcer of sacral region, unstageable: Secondary | ICD-10-CM | POA: Diagnosis not present

## 2021-05-04 DIAGNOSIS — R339 Retention of urine, unspecified: Secondary | ICD-10-CM | POA: Diagnosis not present

## 2021-05-05 DIAGNOSIS — Z1159 Encounter for screening for other viral diseases: Secondary | ICD-10-CM | POA: Diagnosis not present

## 2021-05-05 DIAGNOSIS — M6281 Muscle weakness (generalized): Secondary | ICD-10-CM | POA: Diagnosis not present

## 2021-05-05 DIAGNOSIS — I2609 Other pulmonary embolism with acute cor pulmonale: Secondary | ICD-10-CM | POA: Diagnosis not present

## 2021-05-06 DIAGNOSIS — Z1159 Encounter for screening for other viral diseases: Secondary | ICD-10-CM | POA: Diagnosis not present

## 2021-05-06 DIAGNOSIS — M6281 Muscle weakness (generalized): Secondary | ICD-10-CM | POA: Diagnosis not present

## 2021-05-06 DIAGNOSIS — I2609 Other pulmonary embolism with acute cor pulmonale: Secondary | ICD-10-CM | POA: Diagnosis not present

## 2021-05-09 DIAGNOSIS — M6281 Muscle weakness (generalized): Secondary | ICD-10-CM | POA: Diagnosis not present

## 2021-05-09 DIAGNOSIS — I82432 Acute embolism and thrombosis of left popliteal vein: Secondary | ICD-10-CM | POA: Diagnosis not present

## 2021-05-09 DIAGNOSIS — I2609 Other pulmonary embolism with acute cor pulmonale: Secondary | ICD-10-CM | POA: Diagnosis not present

## 2021-05-09 DIAGNOSIS — I82442 Acute embolism and thrombosis of left tibial vein: Secondary | ICD-10-CM | POA: Diagnosis not present

## 2021-05-09 DIAGNOSIS — I82412 Acute embolism and thrombosis of left femoral vein: Secondary | ICD-10-CM | POA: Diagnosis not present

## 2021-05-09 DIAGNOSIS — Z1159 Encounter for screening for other viral diseases: Secondary | ICD-10-CM | POA: Diagnosis not present

## 2021-05-10 DIAGNOSIS — L89154 Pressure ulcer of sacral region, stage 4: Secondary | ICD-10-CM | POA: Diagnosis not present

## 2021-05-10 DIAGNOSIS — E43 Unspecified severe protein-calorie malnutrition: Secondary | ICD-10-CM | POA: Diagnosis not present

## 2021-05-10 DIAGNOSIS — I2609 Other pulmonary embolism with acute cor pulmonale: Secondary | ICD-10-CM | POA: Diagnosis not present

## 2021-05-10 DIAGNOSIS — L89894 Pressure ulcer of other site, stage 4: Secondary | ICD-10-CM | POA: Diagnosis not present

## 2021-05-10 DIAGNOSIS — M6281 Muscle weakness (generalized): Secondary | ICD-10-CM | POA: Diagnosis not present

## 2021-05-10 DIAGNOSIS — L89893 Pressure ulcer of other site, stage 3: Secondary | ICD-10-CM | POA: Diagnosis not present

## 2021-05-10 DIAGNOSIS — Z1159 Encounter for screening for other viral diseases: Secondary | ICD-10-CM | POA: Diagnosis not present

## 2021-05-11 DIAGNOSIS — I2609 Other pulmonary embolism with acute cor pulmonale: Secondary | ICD-10-CM | POA: Diagnosis not present

## 2021-05-11 DIAGNOSIS — M6281 Muscle weakness (generalized): Secondary | ICD-10-CM | POA: Diagnosis not present

## 2021-05-11 DIAGNOSIS — Z1159 Encounter for screening for other viral diseases: Secondary | ICD-10-CM | POA: Diagnosis not present

## 2021-05-16 DIAGNOSIS — L89893 Pressure ulcer of other site, stage 3: Secondary | ICD-10-CM | POA: Diagnosis not present

## 2021-05-16 DIAGNOSIS — L89620 Pressure ulcer of left heel, unstageable: Secondary | ICD-10-CM | POA: Diagnosis not present

## 2021-05-16 DIAGNOSIS — L89894 Pressure ulcer of other site, stage 4: Secondary | ICD-10-CM | POA: Diagnosis not present

## 2021-05-16 DIAGNOSIS — L89154 Pressure ulcer of sacral region, stage 4: Secondary | ICD-10-CM | POA: Diagnosis not present

## 2021-05-21 ENCOUNTER — Encounter (HOSPITAL_COMMUNITY): Payer: Self-pay | Admitting: Radiology

## 2021-05-23 DIAGNOSIS — L89620 Pressure ulcer of left heel, unstageable: Secondary | ICD-10-CM | POA: Diagnosis not present

## 2021-05-23 DIAGNOSIS — L89893 Pressure ulcer of other site, stage 3: Secondary | ICD-10-CM | POA: Diagnosis not present

## 2021-05-23 DIAGNOSIS — L89154 Pressure ulcer of sacral region, stage 4: Secondary | ICD-10-CM | POA: Diagnosis not present

## 2021-05-23 DIAGNOSIS — L89894 Pressure ulcer of other site, stage 4: Secondary | ICD-10-CM | POA: Diagnosis not present

## 2021-05-25 DIAGNOSIS — F0390 Unspecified dementia without behavioral disturbance: Secondary | ICD-10-CM | POA: Diagnosis not present

## 2021-05-25 DIAGNOSIS — E43 Unspecified severe protein-calorie malnutrition: Secondary | ICD-10-CM | POA: Diagnosis not present

## 2021-05-25 DIAGNOSIS — R339 Retention of urine, unspecified: Secondary | ICD-10-CM | POA: Diagnosis not present

## 2021-05-25 DIAGNOSIS — D638 Anemia in other chronic diseases classified elsewhere: Secondary | ICD-10-CM | POA: Diagnosis not present

## 2021-05-25 DIAGNOSIS — E785 Hyperlipidemia, unspecified: Secondary | ICD-10-CM | POA: Diagnosis not present

## 2021-05-25 DIAGNOSIS — L89150 Pressure ulcer of sacral region, unstageable: Secondary | ICD-10-CM | POA: Diagnosis not present

## 2021-05-25 DIAGNOSIS — I82432 Acute embolism and thrombosis of left popliteal vein: Secondary | ICD-10-CM | POA: Diagnosis not present

## 2021-05-27 DIAGNOSIS — I82432 Acute embolism and thrombosis of left popliteal vein: Secondary | ICD-10-CM | POA: Diagnosis not present

## 2021-05-27 DIAGNOSIS — E43 Unspecified severe protein-calorie malnutrition: Secondary | ICD-10-CM | POA: Diagnosis not present

## 2021-05-27 DIAGNOSIS — D638 Anemia in other chronic diseases classified elsewhere: Secondary | ICD-10-CM | POA: Diagnosis not present

## 2021-05-27 DIAGNOSIS — E785 Hyperlipidemia, unspecified: Secondary | ICD-10-CM | POA: Diagnosis not present

## 2021-05-27 DIAGNOSIS — R339 Retention of urine, unspecified: Secondary | ICD-10-CM | POA: Diagnosis not present

## 2021-05-27 DIAGNOSIS — L89150 Pressure ulcer of sacral region, unstageable: Secondary | ICD-10-CM | POA: Diagnosis not present

## 2021-05-27 DIAGNOSIS — F0390 Unspecified dementia without behavioral disturbance: Secondary | ICD-10-CM | POA: Diagnosis not present

## 2021-05-31 DIAGNOSIS — L89896 Pressure-induced deep tissue damage of other site: Secondary | ICD-10-CM | POA: Diagnosis not present

## 2021-05-31 DIAGNOSIS — L89620 Pressure ulcer of left heel, unstageable: Secondary | ICD-10-CM | POA: Diagnosis not present

## 2021-05-31 DIAGNOSIS — L89894 Pressure ulcer of other site, stage 4: Secondary | ICD-10-CM | POA: Diagnosis not present

## 2021-05-31 DIAGNOSIS — L89154 Pressure ulcer of sacral region, stage 4: Secondary | ICD-10-CM | POA: Diagnosis not present

## 2021-06-06 DIAGNOSIS — L89894 Pressure ulcer of other site, stage 4: Secondary | ICD-10-CM | POA: Diagnosis not present

## 2021-06-06 DIAGNOSIS — L89890 Pressure ulcer of other site, unstageable: Secondary | ICD-10-CM | POA: Diagnosis not present

## 2021-06-06 DIAGNOSIS — L89154 Pressure ulcer of sacral region, stage 4: Secondary | ICD-10-CM | POA: Diagnosis not present

## 2021-06-06 DIAGNOSIS — L89620 Pressure ulcer of left heel, unstageable: Secondary | ICD-10-CM | POA: Diagnosis not present

## 2021-06-08 DIAGNOSIS — F0390 Unspecified dementia without behavioral disturbance: Secondary | ICD-10-CM | POA: Diagnosis not present

## 2021-06-08 DIAGNOSIS — I82412 Acute embolism and thrombosis of left femoral vein: Secondary | ICD-10-CM | POA: Diagnosis not present

## 2021-06-08 DIAGNOSIS — D638 Anemia in other chronic diseases classified elsewhere: Secondary | ICD-10-CM | POA: Diagnosis not present

## 2021-06-08 DIAGNOSIS — E785 Hyperlipidemia, unspecified: Secondary | ICD-10-CM | POA: Diagnosis not present

## 2021-06-08 DIAGNOSIS — I82442 Acute embolism and thrombosis of left tibial vein: Secondary | ICD-10-CM | POA: Diagnosis not present

## 2021-06-08 DIAGNOSIS — I82432 Acute embolism and thrombosis of left popliteal vein: Secondary | ICD-10-CM | POA: Diagnosis not present

## 2021-06-08 DIAGNOSIS — E43 Unspecified severe protein-calorie malnutrition: Secondary | ICD-10-CM | POA: Diagnosis not present

## 2021-07-04 DEATH — deceased

## 2021-08-18 ENCOUNTER — Encounter: Payer: Self-pay | Admitting: Family Medicine

## 2021-10-12 ENCOUNTER — Telehealth: Payer: Self-pay | Admitting: Family Medicine

## 2021-10-12 NOTE — Telephone Encounter (Signed)
Pt daughter called and states that she passed away in 06-21-2022 can you marked as deceased she had got the reminder call for a appt on Friday

## 2021-10-14 ENCOUNTER — Ambulatory Visit: Payer: Medicare HMO | Admitting: Family Medicine

## 2021-10-20 ENCOUNTER — Ambulatory Visit: Payer: Medicare HMO | Admitting: Family Medicine

## 2023-03-27 IMAGING — CT CT ANGIO CHEST-ABD-PELV FOR DISSECTION W/ AND WO/W CM
2 of 7 series · 13 of 46 positions shown, 15 images · non-contrast
Comparison: None.

CLINICAL DATA: Widened mediastinum

EXAM:
CT ANGIOGRAPHY CHEST, ABDOMEN AND PELVIS
TECHNIQUE: Non-contrast CT of the chest was initially obtained.

[Series 8: dissection 3.0 i30f 3 · axial · 0.75mm/px · z∈[+855,+1311]mm · 10 of 174 slices shown, 12 images]
[im 11/174  soft-tissue]
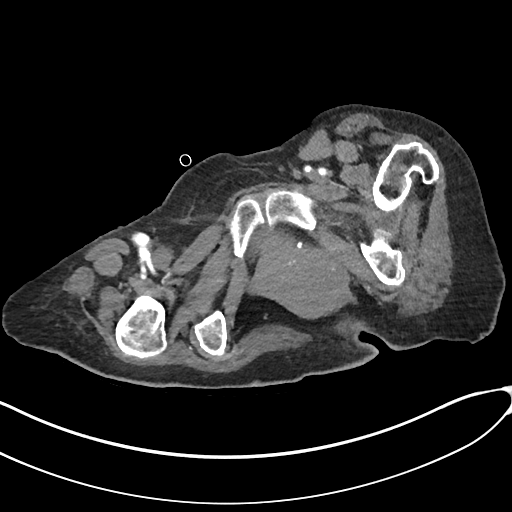
[im 11/174  bone]
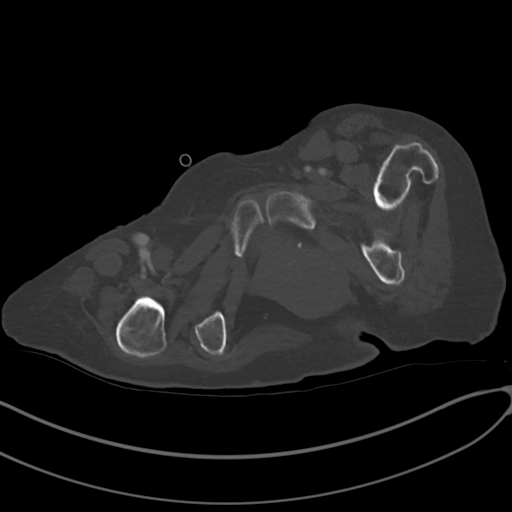
[im 31/174  soft-tissue]
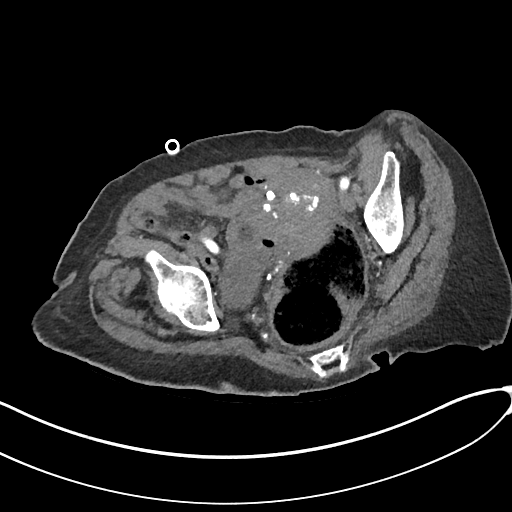
[im 51/174  soft-tissue]
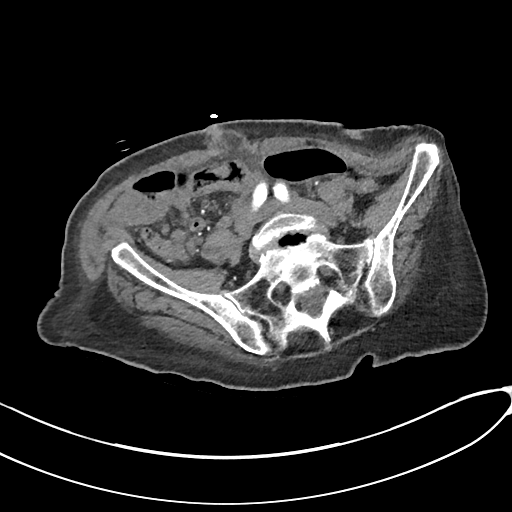
[im 62/174  soft-tissue]
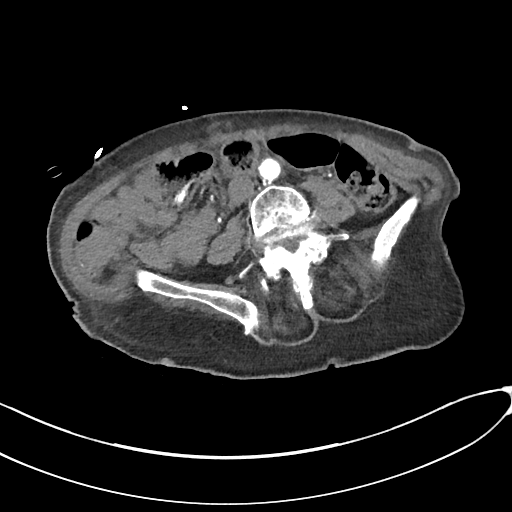
[im 82/174  soft-tissue]
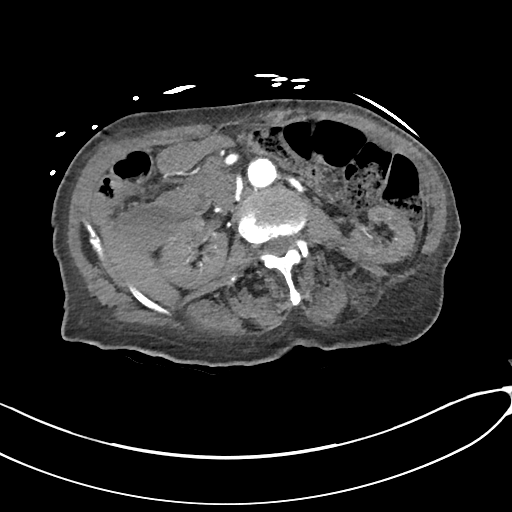
[im 92/174  soft-tissue]
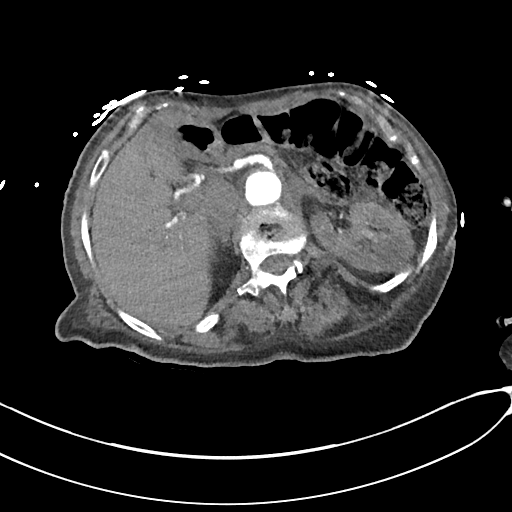
[im 112/174  soft-tissue]
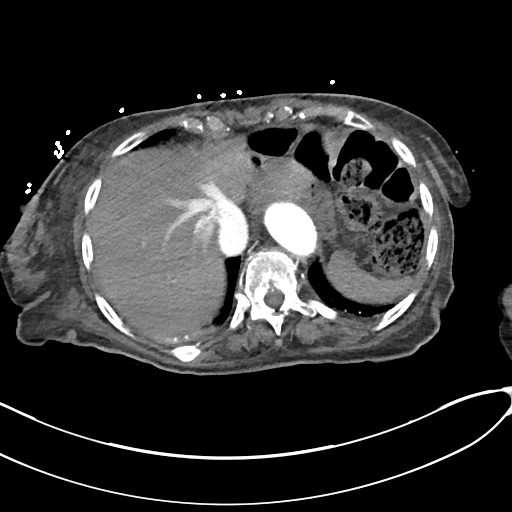
[im 133/174  soft-tissue]
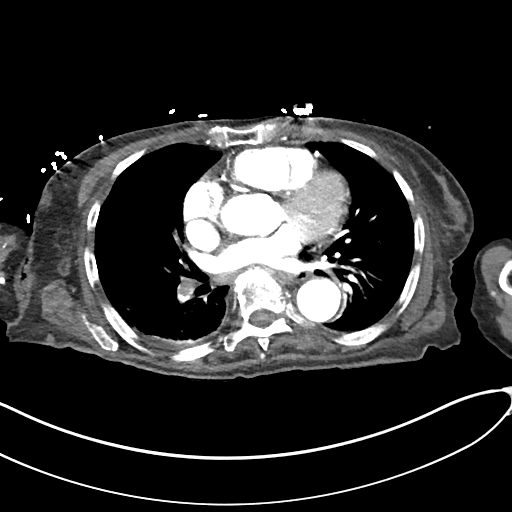
[im 143/174  soft-tissue]
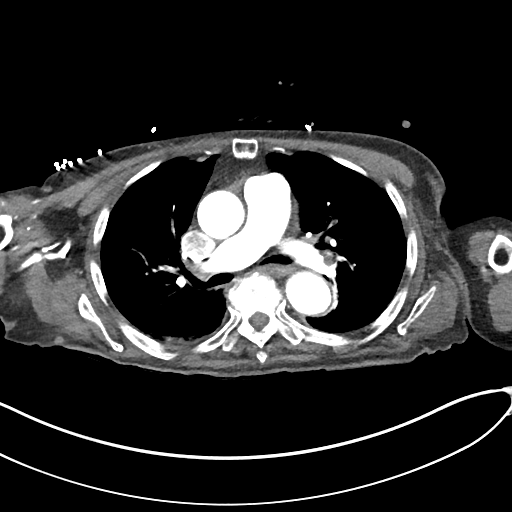
[im 143/174  bone]
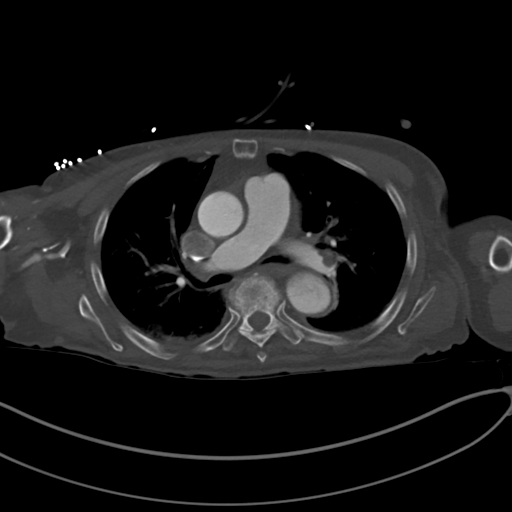
[im 163/174  soft-tissue]
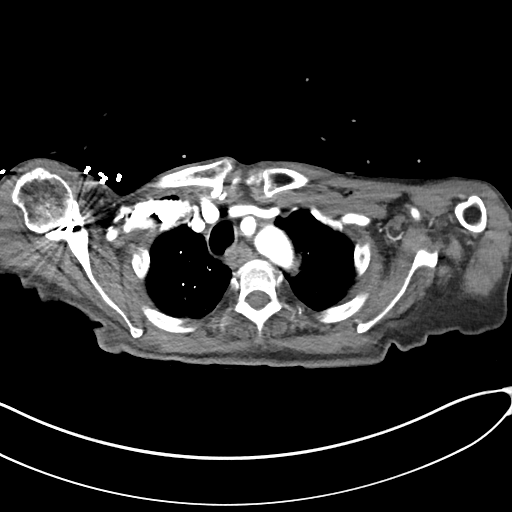

[Series 11: coronals · coronal · 0.73mm/px · 3 of 116 slices shown]
[im 29/116  soft-tissue]
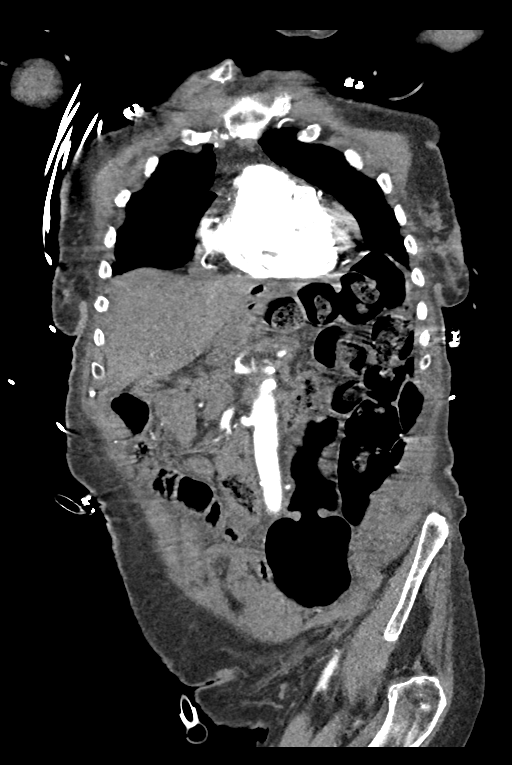
[im 58/116  soft-tissue]
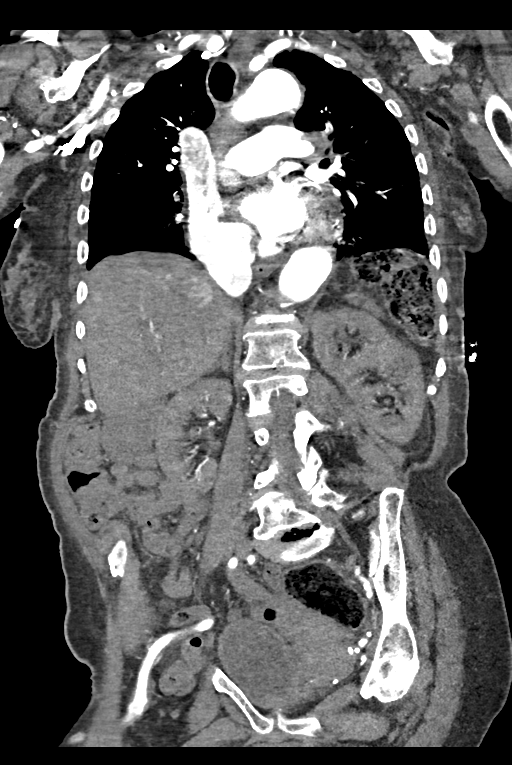
[im 87/116  soft-tissue]
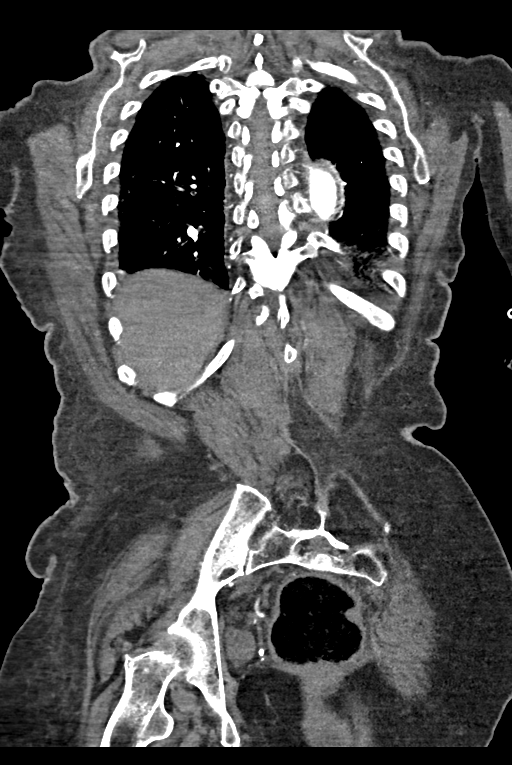

[13 of 46 positions shown; findings below may reference images not displayed]

Multidetector CT imaging through the chest, abdomen and pelvis was
performed using the standard protocol during bolus administration of
intravenous contrast. Multiplanar reconstructed images and MIPs were
obtained and reviewed to evaluate the vascular anatomy.

CONTRAST:  80mL OMNIPAQUE IOHEXOL 350 MG/ML SOLN
FINDINGS: CTA CHEST FINDINGS

Cardiovascular: Pulmonary embolus seen in the right and left
pulmonary arteries which extends into the lobar arteries and
multiple bilateral segmental and subsegmental pulmonary arteries.
Elevated RV to LV ratio. No pericardial effusion.

Mediastinum/Nodes: Patulous esophagus. No pathologically enlarged
lymph nodes seen in the chest.

Lungs/Pleura: Central airways are patent. Bibasilar atelectasis. No
consolidation, pleural effusion or pneumothorax.

Musculoskeletal: No chest wall abnormality. No acute or significant
osseous findings.

Review of the MIP images confirms the above findings.

CTA ABDOMEN AND PELVIS FINDINGS

VASCULAR

Aorta: Normal caliber thoracic aorta with no evidence of dissection
or intramural hematoma. Heterogeneous opacification of the distal
aortic arch and proximal thoracic aorta, likely due to mixing
artifact. Standard 3 vessel aortic arch with patent arch vessels.
Mild to moderate calcified and noncalcified plaque.

Celiac: Patent without evidence of aneurysm, dissection, or
vasculitis. Severe narrowing of the proximal celiac artery, likely
due to diaphragmatic compression.

SMA: Patent without evidence of aneurysm, dissection, vasculitis or
significant stenosis.

Renals: Both renal arteries are patent without evidence of aneurysm,
dissection, vasculitis, fibromuscular dysplasia or significant
stenosis. Accessory left renal artery to the upper pole.

IMA: Patent without evidence of aneurysm, dissection, vasculitis or
significant stenosis.

Inflow: Patent without evidence of aneurysm, dissection, vasculitis
or significant stenosis. Mild scattered calcified plaque.

Veins: Reflux of contrast into the hepatic veins, which can be seen
in the setting of right heart dysfunction.

Review of the MIP images confirms the above findings.

NON-VASCULAR

Hepatobiliary: No focal liver abnormality is seen. Cholelithiasis.
Layering high density material seen in the gallbladder, likely due
to sludge. No gallbladder wall thickening.

Pancreas: Unremarkable. No pancreatic ductal dilatation or
surrounding inflammatory changes.

Spleen: Normal in size without focal abnormality.

Adrenals/Urinary Tract: Bilateral adrenal glands are unremarkable.
No hydronephrosis. Low-density lesion of the mid region of the left
kidney, likely a simple cyst. Air is seen in the urinary bladder.

Stomach/Bowel: Stomach is within normal limits. Appendix is not
visualized. No evidence of bowel wall thickening, distention, or
inflammatory changes.

Lymphatic: No pathologically enlarged lymph nodes seen in the
abdomen or pelvis.

Reproductive: Multiple calcified fibroids in the uterus.

Other: Small fat containing hernia of the ventral abdominal wall.
Small right inguinal hernia containing nondilated loops of bowel. No
abdominopelvic ascites.

Musculoskeletal: No acute or significant osseous findings.

Review of the MIP images confirms the above findings.
IMPRESSION: 1. Pulmonary embolus seen in the right and left pulmonary arteries,
lobar arteries and multiple bilateral segmental and subsegmental
pulmonary arteries.
2. Elevated RV to LV ratio, compatible with right heart strain.
3. No evidence of acute aortic syndrome. Heterogeneous opacification
of the distal aortic arch and proximal descending thoracic aorta is
likely due to mixing artifact.
4. Air is seen in the urinary bladder, correlate for history of
recent instrumentation. Findings can also be seen in the setting of
infection.
5.  Aortic Atherosclerosis ([id number]).

Critical Value/emergent results were called by telephone at the time
of interpretation on 02/12/2021 at [DATE] to provider GIORGI
GALAN , who verbally acknowledged these results.

## 2023-03-27 IMAGING — DX DG CHEST 1V PORT
1 series · 1 of 1 positions shown · non-contrast
Comparison: None.

CLINICAL DATA: Sepsis

EXAM:
PORTABLE CHEST 1 VIEW

[chest]
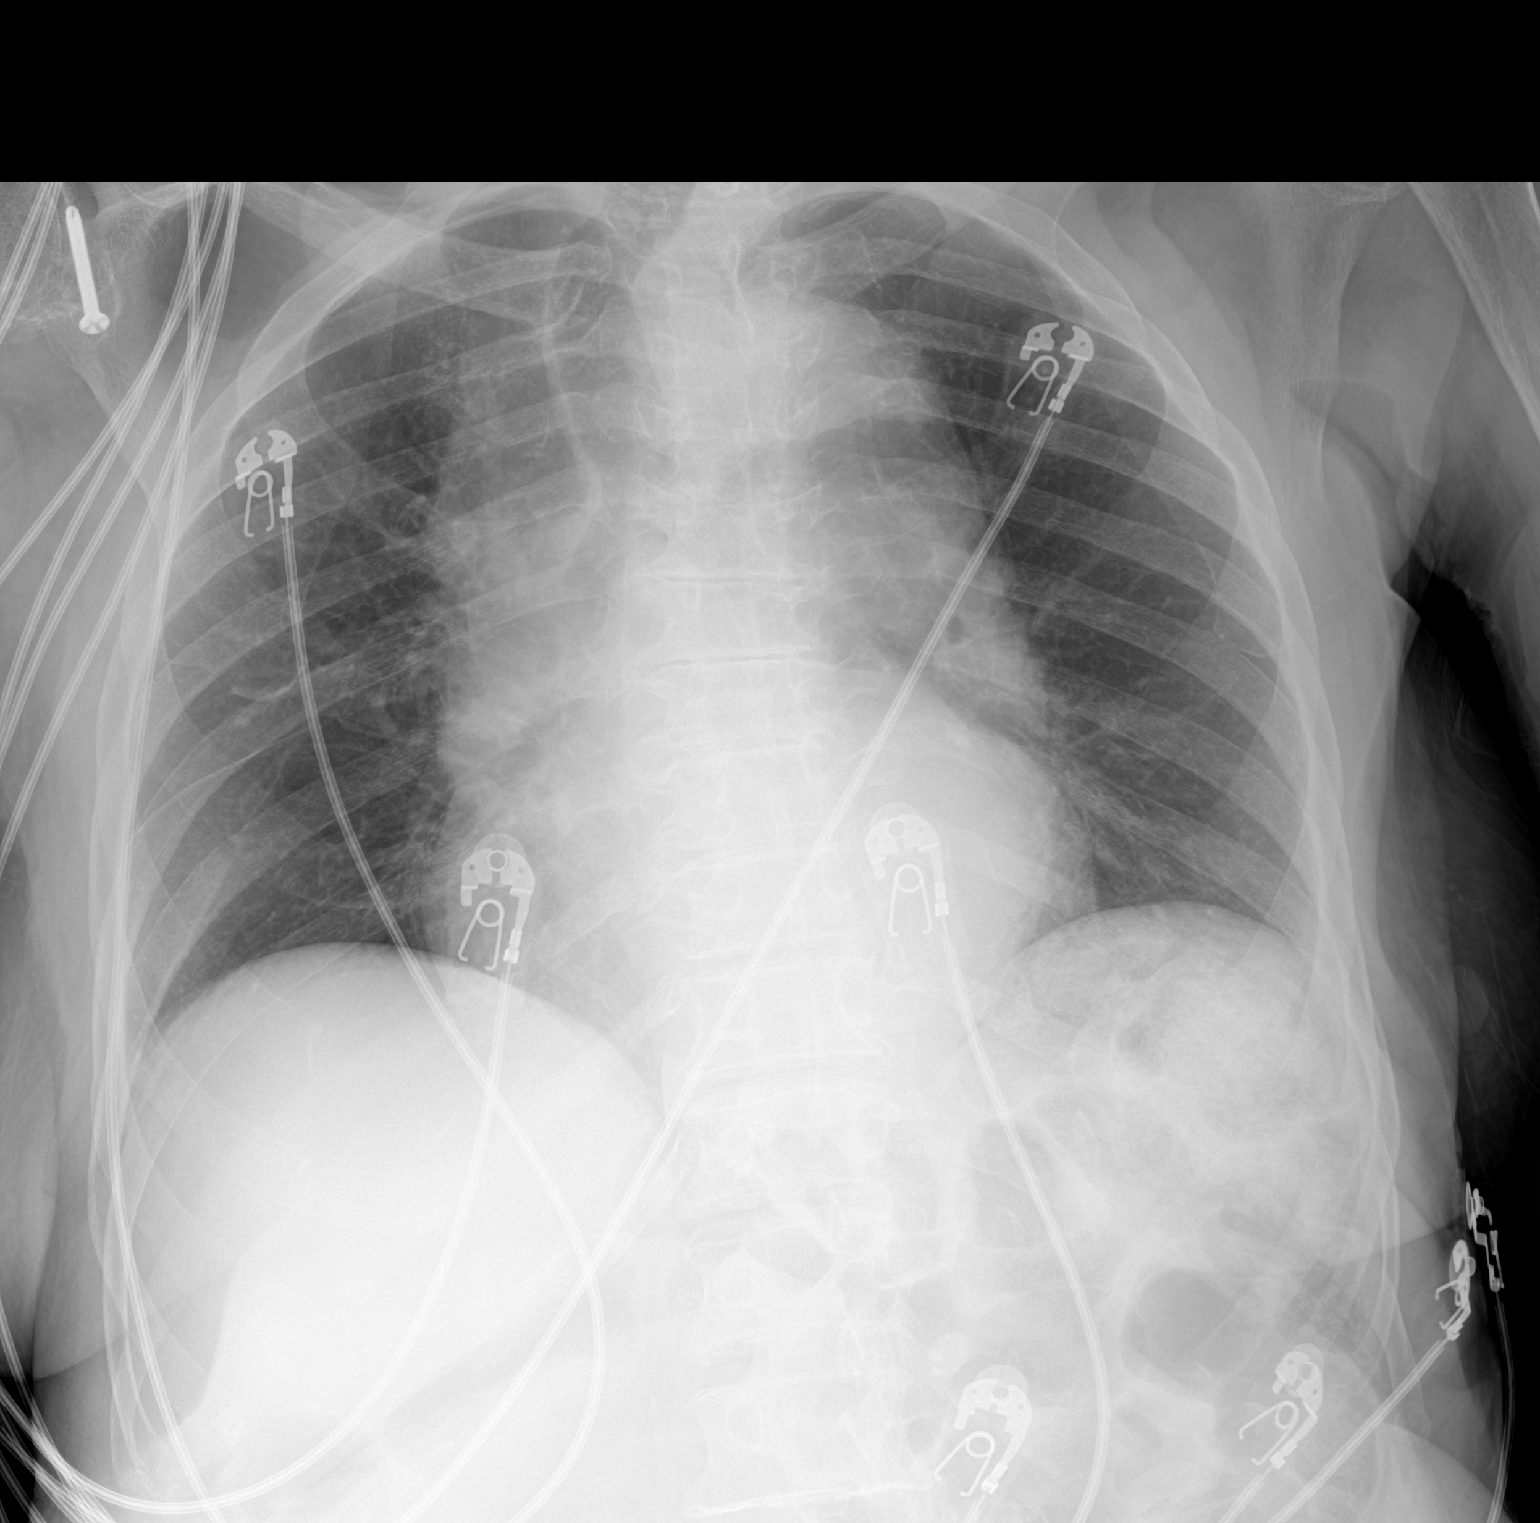

[1 of 1 positions shown; findings below may reference images not displayed]

FINDINGS: Normal cardiac silhouette. Widened upper mediastinum suggest ectatic
aorta. Lungs are clear. No effusion, infiltrate pneumothorax. No
acute osseous abnormality.
IMPRESSION: No clear acute cardiopulmonary findings.

Widened mediastinum favored ectatic aorta.
